# Patient Record
Sex: Male | Born: 1950 | Race: White | Hispanic: No | Marital: Single | Smoking: Former smoker
Health system: Southern US, Community
[De-identification: ages and names within clinical notes are randomized; demographics above are authoritative.]

## PROBLEM LIST (undated history)

## (undated) DIAGNOSIS — J449 Chronic obstructive pulmonary disease, unspecified: Secondary | ICD-10-CM

## (undated) DIAGNOSIS — N400 Enlarged prostate without lower urinary tract symptoms: Secondary | ICD-10-CM

## (undated) DIAGNOSIS — K219 Gastro-esophageal reflux disease without esophagitis: Secondary | ICD-10-CM

## (undated) DIAGNOSIS — E785 Hyperlipidemia, unspecified: Secondary | ICD-10-CM

## (undated) HISTORY — PX: ANKLE SURGERY: SHX546

---

## 2004-04-13 ENCOUNTER — Emergency Department: Payer: Self-pay | Admitting: Emergency Medicine

## 2004-04-15 ENCOUNTER — Other Ambulatory Visit: Payer: Self-pay

## 2004-04-15 ENCOUNTER — Inpatient Hospital Stay: Payer: Self-pay

## 2006-05-18 ENCOUNTER — Other Ambulatory Visit: Payer: Self-pay

## 2006-05-18 ENCOUNTER — Emergency Department: Payer: Self-pay | Admitting: Emergency Medicine

## 2010-08-31 ENCOUNTER — Emergency Department: Payer: Self-pay | Admitting: Emergency Medicine

## 2010-12-23 ENCOUNTER — Emergency Department: Payer: Self-pay | Admitting: Unknown Physician Specialty

## 2013-05-19 DIAGNOSIS — Z720 Tobacco use: Secondary | ICD-10-CM | POA: Insufficient documentation

## 2013-05-19 DIAGNOSIS — R12 Heartburn: Secondary | ICD-10-CM | POA: Insufficient documentation

## 2013-06-24 DIAGNOSIS — E785 Hyperlipidemia, unspecified: Secondary | ICD-10-CM | POA: Insufficient documentation

## 2017-11-16 ENCOUNTER — Inpatient Hospital Stay
Admission: EM | Admit: 2017-11-16 | Discharge: 2017-11-17 | DRG: 872 | Disposition: A | Discharge status: Home / Self Care | Payer: Medicare Other | Attending: Specialist | Admitting: Specialist

## 2017-11-16 ENCOUNTER — Emergency Department: Payer: Medicare Other

## 2017-11-16 ENCOUNTER — Other Ambulatory Visit: Payer: Self-pay

## 2017-11-16 DIAGNOSIS — I700 Atherosclerosis of aorta: Secondary | ICD-10-CM | POA: Diagnosis not present

## 2017-11-16 DIAGNOSIS — R10811 Right upper quadrant abdominal tenderness: Secondary | ICD-10-CM | POA: Diagnosis not present

## 2017-11-16 DIAGNOSIS — E785 Hyperlipidemia, unspecified: Secondary | ICD-10-CM | POA: Diagnosis present

## 2017-11-16 DIAGNOSIS — K219 Gastro-esophageal reflux disease without esophagitis: Secondary | ICD-10-CM | POA: Diagnosis not present

## 2017-11-16 DIAGNOSIS — Z7982 Long term (current) use of aspirin: Secondary | ICD-10-CM | POA: Diagnosis not present

## 2017-11-16 DIAGNOSIS — J439 Emphysema, unspecified: Secondary | ICD-10-CM | POA: Diagnosis not present

## 2017-11-16 DIAGNOSIS — A419 Sepsis, unspecified organism: Secondary | ICD-10-CM | POA: Diagnosis not present

## 2017-11-16 DIAGNOSIS — J441 Chronic obstructive pulmonary disease with (acute) exacerbation: Secondary | ICD-10-CM

## 2017-11-16 DIAGNOSIS — Z23 Encounter for immunization: Secondary | ICD-10-CM

## 2017-11-16 DIAGNOSIS — E871 Hypo-osmolality and hyponatremia: Secondary | ICD-10-CM | POA: Diagnosis not present

## 2017-11-16 DIAGNOSIS — G629 Polyneuropathy, unspecified: Secondary | ICD-10-CM | POA: Diagnosis not present

## 2017-11-16 DIAGNOSIS — F17210 Nicotine dependence, cigarettes, uncomplicated: Secondary | ICD-10-CM | POA: Diagnosis not present

## 2017-11-16 DIAGNOSIS — R6520 Severe sepsis without septic shock: Secondary | ICD-10-CM | POA: Diagnosis present

## 2017-11-16 DIAGNOSIS — J209 Acute bronchitis, unspecified: Secondary | ICD-10-CM | POA: Diagnosis not present

## 2017-11-16 DIAGNOSIS — E876 Hypokalemia: Secondary | ICD-10-CM | POA: Diagnosis not present

## 2017-11-16 HISTORY — DX: Chronic obstructive pulmonary disease, unspecified: J44.9

## 2017-11-16 LAB — CBC WITH DIFFERENTIAL/PLATELET
Basophils Absolute: 0 10*3/uL (ref 0–0.1)
Basophils Relative: 0 %
EOS PCT: 0 %
Eosinophils Absolute: 0 10*3/uL (ref 0–0.7)
HCT: 44.8 % (ref 40.0–52.0)
Hemoglobin: 15.6 g/dL (ref 13.0–18.0)
LYMPHS ABS: 0.6 10*3/uL — AB (ref 1.0–3.6)
Lymphocytes Relative: 7 %
MCH: 33.5 pg (ref 26.0–34.0)
MCHC: 34.8 g/dL (ref 32.0–36.0)
MCV: 96.1 fL (ref 80.0–100.0)
MONO ABS: 0.8 10*3/uL (ref 0.2–1.0)
MONOS PCT: 10 %
Neutro Abs: 7.1 10*3/uL — ABNORMAL HIGH (ref 1.4–6.5)
Neutrophils Relative %: 83 %
PLATELETS: 180 10*3/uL (ref 150–440)
RBC: 4.66 MIL/uL (ref 4.40–5.90)
RDW: 12.9 % (ref 11.5–14.5)
WBC: 8.6 10*3/uL (ref 3.8–10.6)

## 2017-11-16 LAB — COMPREHENSIVE METABOLIC PANEL
ALBUMIN: 3.3 g/dL — AB (ref 3.5–5.0)
ALK PHOS: 40 U/L (ref 38–126)
ALT: 38 U/L (ref 17–63)
AST: 54 U/L — ABNORMAL HIGH (ref 15–41)
Anion gap: 12 (ref 5–15)
BILIRUBIN TOTAL: 0.5 mg/dL (ref 0.3–1.2)
BUN: 10 mg/dL (ref 6–20)
CALCIUM: 7.5 mg/dL — AB (ref 8.9–10.3)
CO2: 20 mmol/L — ABNORMAL LOW (ref 22–32)
CREATININE: 1.04 mg/dL (ref 0.61–1.24)
Chloride: 94 mmol/L — ABNORMAL LOW (ref 101–111)
GFR calc Af Amer: >60 mL/min (ref >60)
GFR calc non Af Amer: >60 mL/min (ref >60)
GLUCOSE: 243 mg/dL — AB (ref 65–99)
Potassium: 2.9 mmol/L — ABNORMAL LOW (ref 3.5–5.1)
Sodium: 126 mmol/L — ABNORMAL LOW (ref 135–145)
TOTAL PROTEIN: 6.6 g/dL (ref 6.5–8.1)

## 2017-11-16 LAB — URINALYSIS, COMPLETE (UACMP) WITH MICROSCOPIC
BACTERIA UA: NONE SEEN
Bilirubin Urine: NEGATIVE
Glucose, UA: >=500 mg/dL — AB
Hgb urine dipstick: NEGATIVE
Ketones, ur: NEGATIVE mg/dL
Leukocytes, UA: NEGATIVE
Nitrite: NEGATIVE
PH: 6 (ref 5.0–8.0)
Protein, ur: NEGATIVE mg/dL
SPECIFIC GRAVITY, URINE: 1.044 — AB (ref 1.005–1.030)
SQUAMOUS EPITHELIAL / LPF: NONE SEEN (ref 0–5)

## 2017-11-16 LAB — POTASSIUM: POTASSIUM: 3.9 mmol/L (ref 3.5–5.1)

## 2017-11-16 LAB — LACTIC ACID, PLASMA
Lactic Acid, Venous: 2.5 mmol/L (ref 0.5–1.9)
Lactic Acid, Venous: 1.1 mmol/L (ref 0.5–1.9)

## 2017-11-16 LAB — PHOSPHORUS: Phosphorus: 2.5 mg/dL (ref 2.5–4.6)

## 2017-11-16 LAB — INFLUENZA PANEL BY PCR (TYPE A & B)
INFLAPCR: NEGATIVE
INFLBPCR: NEGATIVE

## 2017-11-16 LAB — PROTIME-INR
INR: 0.96
PROTHROMBIN TIME: 12.7 s (ref 11.4–15.2)

## 2017-11-16 LAB — MAGNESIUM: MAGNESIUM: 1.7 mg/dL (ref 1.7–2.4)

## 2017-11-16 MED ORDER — IPRATROPIUM-ALBUTEROL 0.5-2.5 (3) MG/3ML IN SOLN
3.0000 mL | Freq: Once | RESPIRATORY_TRACT | Status: AC
  Administered 2017-11-16: 3 mL via RESPIRATORY_TRACT
  Filled 2017-11-16: qty 3

## 2017-11-16 MED ORDER — SODIUM CHLORIDE 0.9 % IV SOLN
2.0000 g | INTRAVENOUS | Status: DC
  Administered 2017-11-16 – 2017-11-17 (×2): 2 g via INTRAVENOUS
  Filled 2017-11-16 (×2): qty 20
  Filled 2017-11-16: qty 2

## 2017-11-16 MED ORDER — LORAZEPAM 2 MG PO TABS: 0.0000 mg | ORAL_TABLET | Freq: Two times a day (BID) | ORAL | Status: DC

## 2017-11-16 MED ORDER — MAGNESIUM SULFATE 2 GM/50ML IV SOLN
2.0000 g | Freq: Once | INTRAVENOUS | Status: AC
Start: 2017-11-16 — End: 2017-11-16
  Administered 2017-11-16: 13:00:00 2 g via INTRAVENOUS
  Filled 2017-11-16: qty 50

## 2017-11-16 MED ORDER — IPRATROPIUM-ALBUTEROL 0.5-2.5 (3) MG/3ML IN SOLN
3.0000 mL | Freq: Four times a day (QID) | RESPIRATORY_TRACT | Status: DC
  Administered 2017-11-16 – 2017-11-17 (×5): 3 mL via RESPIRATORY_TRACT
  Filled 2017-11-16 (×5): qty 3

## 2017-11-16 MED ORDER — METHYLPREDNISOLONE SODIUM SUCC 125 MG IJ SOLR
60.0000 mg | Freq: Three times a day (TID) | INTRAMUSCULAR | Status: DC
  Administered 2017-11-16 – 2017-11-17 (×3): 60 mg via INTRAVENOUS
  Filled 2017-11-16 (×3): qty 2

## 2017-11-16 MED ORDER — DOCUSATE SODIUM 100 MG PO CAPS
100.0000 mg | ORAL_CAPSULE | Freq: Two times a day (BID) | ORAL | Status: DC
  Administered 2017-11-17: 10:00:00 100 mg via ORAL
  Filled 2017-11-16 (×2): qty 1

## 2017-11-16 MED ORDER — NICOTINE 21 MG/24HR TD PT24
21.0000 mg | MEDICATED_PATCH | Freq: Every day | TRANSDERMAL | Status: DC
  Administered 2017-11-16 – 2017-11-17 (×2): 21 mg via TRANSDERMAL
  Filled 2017-11-16 (×2): qty 1

## 2017-11-16 MED ORDER — ALBUTEROL SULFATE (2.5 MG/3ML) 0.083% IN NEBU: 2.5000 mg | INHALATION_SOLUTION | RESPIRATORY_TRACT | Status: DC | PRN

## 2017-11-16 MED ORDER — IOHEXOL 300 MG/ML  SOLN
100.0000 mL | Freq: Once | INTRAMUSCULAR | Status: AC | PRN
  Administered 2017-11-16: 100 mL via INTRAVENOUS

## 2017-11-16 MED ORDER — ACETAMINOPHEN 650 MG RE SUPP: 650.0000 mg | Freq: Four times a day (QID) | RECTAL | Status: DC | PRN

## 2017-11-16 MED ORDER — ONDANSETRON HCL 4 MG PO TABS: 4.0000 mg | ORAL_TABLET | Freq: Four times a day (QID) | ORAL | Status: DC | PRN

## 2017-11-16 MED ORDER — VITAMIN B-1 100 MG PO TABS
100.0000 mg | ORAL_TABLET | Freq: Every day | ORAL | Status: DC
  Administered 2017-11-16 – 2017-11-17 (×2): 100 mg via ORAL
  Filled 2017-11-16 (×2): qty 1

## 2017-11-16 MED ORDER — LORAZEPAM 2 MG/ML IJ SOLN
1.0000 mg | Freq: Four times a day (QID) | INTRAMUSCULAR | Status: DC | PRN
Start: 2017-11-16 — End: 2017-11-17

## 2017-11-16 MED ORDER — ONDANSETRON HCL 4 MG/2ML IJ SOLN: 4.0000 mg | Freq: Four times a day (QID) | INTRAMUSCULAR | Status: DC | PRN

## 2017-11-16 MED ORDER — LORAZEPAM 2 MG PO TABS: 0.0000 mg | ORAL_TABLET | Freq: Four times a day (QID) | ORAL | Status: DC

## 2017-11-16 MED ORDER — SODIUM CHLORIDE 0.9 % IV BOLUS
1000.0000 mL | Freq: Once | INTRAVENOUS | Status: AC
  Administered 2017-11-16: 1000 mL via INTRAVENOUS

## 2017-11-16 MED ORDER — SODIUM CHLORIDE 0.9 % IV SOLN
500.0000 mg | INTRAVENOUS | Status: DC
  Administered 2017-11-16: 500 mg via INTRAVENOUS
  Filled 2017-11-16 (×2): qty 500

## 2017-11-16 MED ORDER — TRAMADOL HCL 50 MG PO TABS
50.0000 mg | ORAL_TABLET | Freq: Four times a day (QID) | ORAL | Status: DC | PRN
  Filled 2017-11-16: qty 1

## 2017-11-16 MED ORDER — ADULT MULTIVITAMIN W/MINERALS CH
1.0000 | ORAL_TABLET | Freq: Every day | ORAL | Status: DC
  Administered 2017-11-16 – 2017-11-17 (×2): 1 via ORAL
  Filled 2017-11-16 (×2): qty 1

## 2017-11-16 MED ORDER — CALCIUM CARBONATE ANTACID 500 MG PO CHEW
500.0000 mg | CHEWABLE_TABLET | Freq: Two times a day (BID) | ORAL | Status: DC
  Administered 2017-11-16 – 2017-11-17 (×2): 500 mg via ORAL
  Filled 2017-11-16 (×2): qty 3

## 2017-11-16 MED ORDER — SODIUM CHLORIDE 0.9% FLUSH: 3.0000 mL | INTRAVENOUS | Status: DC | PRN

## 2017-11-16 MED ORDER — ASPIRIN EC 81 MG PO TBEC
81.0000 mg | DELAYED_RELEASE_TABLET | Freq: Every day | ORAL | Status: DC
  Administered 2017-11-16 – 2017-11-17 (×2): 81 mg via ORAL
  Filled 2017-11-16 (×2): qty 1

## 2017-11-16 MED ORDER — ENOXAPARIN SODIUM 40 MG/0.4ML ~~LOC~~ SOLN
40.0000 mg | SUBCUTANEOUS | Status: DC
  Administered 2017-11-16 – 2017-11-17 (×2): 40 mg via SUBCUTANEOUS
  Filled 2017-11-16 (×2): qty 0.4

## 2017-11-16 MED ORDER — THIAMINE HCL 100 MG/ML IJ SOLN
100.0000 mg | Freq: Every day | INTRAMUSCULAR | Status: DC
  Filled 2017-11-16 (×2): qty 1

## 2017-11-16 MED ORDER — POTASSIUM CHLORIDE 10 MEQ/100ML IV SOLN
10.0000 meq | Freq: Once | INTRAVENOUS | Status: AC
  Administered 2017-11-16: 10 meq via INTRAVENOUS
  Filled 2017-11-16: qty 100

## 2017-11-16 MED ORDER — LORAZEPAM 1 MG PO TABS: 1.0000 mg | ORAL_TABLET | Freq: Four times a day (QID) | ORAL | Status: DC | PRN

## 2017-11-16 MED ORDER — SODIUM CHLORIDE 0.9% FLUSH
3.0000 mL | Freq: Two times a day (BID) | INTRAVENOUS | Status: DC
  Administered 2017-11-16 – 2017-11-17 (×3): 3 mL via INTRAVENOUS

## 2017-11-16 MED ORDER — POTASSIUM CHLORIDE CRYS ER 20 MEQ PO TBCR
40.0000 meq | EXTENDED_RELEASE_TABLET | Freq: Once | ORAL | Status: AC
  Administered 2017-11-16: 14:00:00 40 meq via ORAL
  Filled 2017-11-16: qty 2

## 2017-11-16 MED ORDER — FOLIC ACID 1 MG PO TABS
1.0000 mg | ORAL_TABLET | Freq: Every day | ORAL | Status: DC
  Administered 2017-11-16 – 2017-11-17 (×2): 1 mg via ORAL
  Filled 2017-11-16 (×2): qty 1

## 2017-11-16 MED ORDER — PNEUMOCOCCAL VAC POLYVALENT 25 MCG/0.5ML IJ INJ
0.5000 mL | INJECTION | INTRAMUSCULAR | Status: AC
  Administered 2017-11-17: 10:00:00 0.5 mL via INTRAMUSCULAR
  Filled 2017-11-16: qty 0.5

## 2017-11-16 MED ORDER — IBUPROFEN 600 MG PO TABS
600.0000 mg | ORAL_TABLET | ORAL | Status: AC
  Administered 2017-11-16: 600 mg via ORAL
  Filled 2017-11-16: qty 1

## 2017-11-16 MED ORDER — METHYLPREDNISOLONE SODIUM SUCC 125 MG IJ SOLR
125.0000 mg | INTRAMUSCULAR | Status: AC
  Administered 2017-11-16: 125 mg via INTRAVENOUS
  Filled 2017-11-16: qty 2

## 2017-11-16 MED ORDER — SODIUM CHLORIDE 0.9 % IV BOLUS: 1000.0000 mL | Freq: Once | INTRAVENOUS | Status: DC

## 2017-11-16 MED ORDER — ACETAMINOPHEN 325 MG PO TABS: 650.0000 mg | ORAL_TABLET | Freq: Four times a day (QID) | ORAL | Status: DC | PRN

## 2017-11-16 MED ORDER — ACETAMINOPHEN 500 MG PO TABS
1000.0000 mg | ORAL_TABLET | ORAL | Status: AC
  Administered 2017-11-16: 1000 mg via ORAL
  Filled 2017-11-16: qty 2

## 2017-11-16 NOTE — Progress Notes (Signed)
Chaplain received an OR to complete an AD for the patient. Chaplain introduced herself and asked if the patient requested some information on the AD. Patient agreed and visiting friend also asked if I could tell them more. During education, patient made a facial gesture and he explained that they had just gone through this process for his mother, who had recently passed away.  Patient began to share information about some family relationship concerns as well as the difficulty he has been having moving forward in his grief and anger.  Patient and his sister are estranged and he has some negative emotions around the care and treatment of their mother. Guest asked if Chaplain could pray, which she obliged. Chaplain prayed and patient expressed gratitude. Chaplain completed AD education and left application with patient for review.       11/16/17 1500  Clinical Encounter Type  Visited With Patient and family together  Visit Type Initial  Referral From Physician  Spiritual Encounters  Spiritual Needs Prayer;Emotional  Stress Factors  Patient Stress Factors Family relationships

## 2017-11-16 NOTE — Progress Notes (Signed)
Pt admitted under ED with Pneumonia/sepsis. Lactic acid decreasing. Reports drinks 6 pack per day and hasn't drank for couple of days; smokes 2 ppd and has not smoked for 2 days. Reports has episodes where he blacks out from coughing and finds himself on floor; denies history of heart problems, irregular rhythm or chest pain other than from "my smoking". CIWA negative at this time. Reports abdomen sore form coughing- refused pain med offers x 2. Hypotensive upon initial standing. Pulse ox stable on 2l/[place].

## 2017-11-16 NOTE — ED Triage Notes (Signed)
Alert, oriented, ambulatory around treatment room. States he was coming up here last night and didn't want to so came this AM. States SOB, "eyes are tired." states he's cold and hot. Someone tried give him mucinex but he didn't want that because it made his chest. States cough. States fever but didn't take it. Talking in complete sentences. Symptoms since Thursday.

## 2017-11-16 NOTE — Care Management Note (Signed)
Case Management Note  Patient Details  Name: Rodney Howe MRN: [id number] Date of Birth: [birth year]  Subjective/Objective:     RNCM received from admitting MD. Consult completed on patient. Patient lives with his girlfriend Rodney Howe [phone]. Patient currently uninsured but follows a the [facility] for all PCP and medication needs, currently obtains medications without issue. Able to complete all activities of daily living independently. No use of DME. Still drives.               Action/Plan:  RNCM to continue to follow for any needs.  Expected Discharge Date:  11/18/17               Expected Discharge Plan:     In-House Referral:     Discharge planning Services     Post Acute Care Choice:    Choice offered to:     DME Arranged:    DME Agency:     HH Arranged:    HH Agency:     Status of Service:     If discussed at [employer], dates discussed:    Additional Comments:  Latanya Maudlin, RN 11/16/2017, 2:36 PM

## 2017-11-16 NOTE — Progress Notes (Addendum)
MEDICATION RELATED CONSULT NOTE - INITIAL   Pharmacy Consult for electrolyte management Indication: hypokalemia  No Known Allergies  Patient Measurements: Height: 5\' 6"  (167.6 cm) Weight: 143 lb 6.4 oz (65 kg) IBW/kg (Calculated) : 63.8 Adjusted Body Weight:   Vital Signs: Temp: 99.3 F (37.4 C) (06/16 1128) Temp Source: Oral (06/16 1128) BP: 91/65 (06/16 1214) Pulse Rate: 88 (06/16 1214) Intake/Output from previous day: No intake/output data recorded. Intake/Output from this shift: No intake/output data recorded.  Labs: Recent Labs    11/16/17 0857  WBC 8.6  HGB 15.6  HCT 44.8  PLT 180  CREATININE 1.04  ALBUMIN 3.3*  PROT 6.6  AST 54*  ALT 38  ALKPHOS 40  BILITOT 0.5   Estimated Creatinine Clearance: 63.9 mL/min (by C-G formula based on SCr of 1.04 mg/dL).   Microbiology: No results found for this or any previous visit (from the past 720 hour(s)).  Medical History: Past Medical History:  Diagnosis Date  . COPD (chronic obstructive pulmonary disease) (HCC)     Medications:  Infusions:  . azithromycin Stopped (11/16/17 1017)  . cefTRIAXone (ROCEPHIN)  IV Stopped (11/16/17 1017)  . magnesium sulfate 1 - 4 g bolus IVPB 2 g (11/16/17 1246)    Assessment: 67 yom cc fever/lethargy with PMH COPD, current smoker and EtOH about 12 beers per day. CXR negative. Low K noted, pharmacy consulted to manage electrolytes.  Goal of Therapy:  K 3.5 to 5 Ca 8.9 to 10.3 Mg 1.7 to 2.4 Phos 2.5 to 4.6  Plan:  EDP gave KCl 10 mEq IV x 1, and admitting ordered Klor-Con 40 mEq po x 1. Give those doses. Give calcium carbonate 500 mg of elemental calcium po BID WF x 2 doses. Add-on Mg/Phos. Will recheck K at 20:00. Recheck all electrolytes tomorrow with AM labs.  11/16/2017 14:30 add-on Mg 1.7, phos 2.5. No supplement indicated at this point. Recheck electrolytes as above.   Laural Benes, Pharm.D., BCPS Clinical Pharmacist 11/16/2017,1:29 PM

## 2017-11-16 NOTE — H&P (Signed)
Rodney Howe at [facility] NAME: Rodney Howe    MR#:  [id number]  DATE OF BIRTH:  [birth year]  DATE OF ADMISSION:  11/16/2017  PRIMARY CARE PHYSICIAN: [facility] [place]   REQUESTING/REFERRING PHYSICIAN: [facility]  CHIEF COMPLAINT:  Fever and tiredness  HISTORY OF PRESENT ILLNESS:  Rodney Howe  is a 67 y.o. male with a known history of COPD, continues to smoke 2 packs a day, drinks 12 beer per day is presenting to the ED with a chief complaint of fever and tiredness.  Patient reports he has been coughing a lot which has been progressively getting worse and feels short of breath with minimal exertion.  Patient is reporting abdominal pain with cough CT abdomen was done in the ED with no acute findings.  Chest x-ray negative.  Patient was given Solu-Medrol and IV antibiotics were started hospitalist team is called to admit the patient.  PAST MEDICAL HISTORY:   Past Medical History:  Diagnosis Date  . COPD (chronic obstructive pulmonary disease) ([place])     PAST SURGICAL HISTOIRY:   Past Surgical History:  Procedure Laterality Date  . ANKLE SURGERY      SOCIAL HISTORY:   Reports smoking 2 packs a day for the past 49 years Patient drinks 12 cans of beer every day  FAMILY HISTORY:  History reviewed. No pertinent family history. Denies any hypertension or diabetes mellitus or heart condition DRUG ALLERGIES:  No Known Allergies  REVIEW OF SYSTEMS:  CONSTITUTIONAL: Reporting fever and weakness  eYES: No blurred or double vision.  EARS, NOSE, AND THROAT: No tinnitus or ear pain.  RESPIRATORY:  reporting cough, shortness of breath with exertion  denieswheezing or hemoptysis.  CARDIOVASCULAR: No chest pain, orthopnea, edema.  GASTROINTESTINAL: No nausea, vomiting, diarrhea or abdominal pain.  GENITOURINARY: No dysuria, hematuria.  ENDOCRINE: No polyuria, nocturia,  HEMATOLOGY: No anemia, easy bruising or bleeding SKIN:  No rash or lesion. MUSCULOSKELETAL: No joint pain or arthritis.   NEUROLOGIC: No tingling, numbness, weakness.  PSYCHIATRY: No anxiety or depression.   MEDICATIONS AT HOME:   Prior to Admission medications   Medication Sig Start Date End Date Taking? Authorizing Provider  aspirin EC 81 MG tablet Take 81 mg by mouth daily.   Yes [provider]      VITAL SIGNS:  Blood pressure 91/65, pulse 88, temperature 99.3 F (37.4 C), temperature source Oral, resp. rate 20, height _0  (1.676 m), weight 65 kg (143 lb 6.4 oz), SpO2 95 %.  PHYSICAL EXAMINATION:  GENERAL:  67 y.o.-year-old patient lying in the bed with no acute distress.  EYES: Pupils equal, round, reactive to light and accommodation. No scleral icterus. Extraocular muscles intact.  HEENT: Head atraumatic, normocephalic. Oropharynx and nasopharynx clear.  NECK:  Supple, no jugular venous distention. No thyroid enlargement, no tenderness.  LUNGS:  moderate breath sounds bilaterally, diffuse wheezing, no rales,rhonchi or crepitation. No use of accessory muscles of respiration.  CARDIOVASCULAR: S1, S2 normal. No murmurs, rubs, or gallops.  ABDOMEN: Soft, nontender, nondistended. Bowel sounds present. No organomegaly or mass.  EXTREMITIES: No pedal edema, cyanosis, or clubbing.  NEUROLOGIC: Cranial nerves II through XII are intact. Muscle strength 5/5 in all extremities. Sensation intact. Gait not checked.  PSYCHIATRIC: The patient is alert and oriented x 3.  SKIN: No obvious rash, lesion, or ulcer.   LABORATORY PANEL:   CBC Recent Labs  Lab 11/16/17 0857  WBC 8.6  HGB 15.6  HCT 44.8  PLT 180   ------------------------------------------------------------------------------------------------------------------  Chemistries  Recent Labs  Lab 11/16/17 0857  NA 126*  K 2.9*  CL 94*  CO2 20*  GLUCOSE 243*  BUN 10  CREATININE 1.04  CALCIUM 7.5*  AST 54*  ALT 38  ALKPHOS 40  BILITOT 0.5    ------------------------------------------------------------------------------------------------------------------  Cardiac Enzymes No results for input(s): TROPONINI in the last 168 hours. ------------------------------------------------------------------------------------------------------------------  RADIOLOGY:  Ct Abdomen Pelvis W Contrast  Result Date: 11/16/2017 CLINICAL DATA:  Fever. Tachycardia. Right-sided abdominal soreness. Question peritonitis. EXAM: CT ABDOMEN AND PELVIS WITH CONTRAST TECHNIQUE: Multidetector CT imaging of the abdomen and pelvis was performed using the standard protocol following bolus administration of intravenous contrast. CONTRAST:  132m OMNIPAQUE IOHEXOL 300 MG/ML  SOLN COMPARISON:  12/23/2010 FINDINGS: Lower chest: Normal heart size without pericardial or pleural effusion. Emphysema. Hepatobiliary: Low-density liver lesions are similar and likely cysts. Normal gallbladder, without biliary ductal dilatation. Pancreas: Normal, without mass or ductal dilatation. Spleen: Normal in size, without focal abnormality. Adrenals/Urinary Tract: Normal adrenal glands. Normal kidneys, without hydronephrosis. Normal urinary bladder. Stomach/Bowel: Proximal gastric underdistention. Scattered colonic diverticula. Normal terminal ileum and appendix. Normal small bowel. Vascular/Lymphatic: Aortic and branch vessel atherosclerosis. Atherosclerotic calcifications at the origin of both renal arteries. No abdominopelvic adenopathy. Reproductive: Normal prostate. Other: No significant free fluid. Musculoskeletal: Degenerative disc disease at the lumbosacral junction. IMPRESSION: 1.  No acute process in the abdomen or pelvis. 2. Aortic atherosclerosis (ICD10-I70.0) and emphysema (ICD10-J43.9). Electronically Signed   By: KAbigail MiyamotoM.D.   On: 11/16/2017 10:26   Dg Chest Portable 1 View  Result Date: 11/16/2017 CLINICAL DATA:  Fatigue, fever, shortness of breath EXAM: PORTABLE CHEST 1  VIEW COMPARISON:  12/23/2010 FINDINGS: Heart and mediastinal contours are within normal limits. No focal opacities or effusions. No acute bony abnormality. IMPRESSION: No active disease. Electronically Signed   By: KRolm BaptiseM.D.   On: 11/16/2017 09:11    EKG:   Orders placed or performed during the hospital encounter of 11/16/17  . EKG 12-Lead  . EKG 12-Lead    IMPRESSION AND PLAN:   Rodney Howe is a 67y.o. male with a known history of COPD, continues to smoke 2 packs a day, drinks 12 beer per day is presenting to the ED with a chief complaint of fever and tiredness.  Patient reports he has been coughing a lot which has been progressively getting worse and feels short of breath with minimal exertion.  Patient is reporting abdominal pain with cough CT abdomen was done in the ED with no acute findings.  Chest x-ray negative.   #Sepsis probably from acute bronchitis- Admit to MedSurg unit Patient met septic criteria with fever, tachycardia, elevated lactic acid level at the time of admission  blood cultures, urine cultures and sputum culture and sensitivity ordered Chest x-ray negative flu test is negative Started on antibiotic Rocephin and azithromycin  #Acute COPD Solu-Medrol 125 mg IV given in the ED continue salmeterol 60 mg IV every 8 hours and bronchodilator treatments Sputum culture and sensitivity Antibiotics Rocephin and azithromycin Stop smoking  #Hyponatremia-secondary to beer potomania Gentle hydration with IV fluids Check a.m. Labs Patient is mentating fine   #Hypokalemia potassium at 2.9 replete and recheck in a.m. check magnesium also  #alcohol abuse ciwa Patient will be benefited with outpatient [place]pauldingCounseled patient to stop drinking alcohol  #Tobacco abuse disorder Patient smokes 2 packs a day  Counseled patient to quit completely for 5 min.  Patient is willing to  quit smoking.  Will provide nicotine patch       All the records are reviewed and  case discussed with ED provider. Management plans discussed with the patient, family and they are in agreement.  CODE STATUS: FC , girl friend  Production designer, theatre/television/film  TOTAL TIME TAKING CARE OF THIS PATIENT: 42  minutes.   Note: This dictation was prepared with Dragon dictation along with smaller phrase technology. Any transcriptional errors that result from this process are unintentional.  Nicholes Mango M.D on 11/16/2017 at 1:02 PM  Between 7am to 6pm - Pager - [phone]  After 6pm go to [email or web address] - password [email or web address] [facility]  [facility]  Office  [phone]  CC: Primary care physician; [facility]

## 2017-11-16 NOTE — ED Provider Notes (Signed)
[facility] Emergency Department Provider Note   ____________________________________________   First MD Initiated Contact with Patient 11/16/17 [phone]     (approximate)  I have reviewed the triage vital signs and the nursing notes.   HISTORY  Chief Complaint Fatigue    HPI Rodney Howe is a 67 y.o. male reports no major medical history, thinks he might have COPD  Patient reports that for about 3 days now has been feeling fatigued.  He has not been eating well, he reports he was out in the rain working on cars and he is been having a bit of a cough and chest congestion.  Been coughing hard at times.  Reports he has been feeling chills fatigue and very short of breath with walking.  No chest pain, but does report a slight achiness behind the ribs on his right lower chest wall with coughing.  No abdominal pain, reports nausea and decreased appetite.  Has a history of COPD he thinks, but not using any treatment for it.  Has noticed some slight wheezing as well  No recent hospitalizations.  No recent use of antibiotics.  Past Medical History:  Diagnosis Date  . COPD (chronic obstructive pulmonary disease) (HCC)     There are no active problems to display for this patient.   Past Surgical History:  Procedure Laterality Date  . ANKLE SURGERY      Prior to Admission medications   Medication Sig Start Date End Date Taking? Authorizing Provider  aspirin EC 81 MG tablet Take 81 mg by mouth daily.   Yes [provider]    Allergies Patient has no known allergies.  History reviewed. No pertinent family history.  Social History Social History   Tobacco Use  . Smoking status: Current Every Day Smoker  Substance Use Topics  . Alcohol use: Never    Frequency: Never  . Drug use: Not on file    Review of Systems Constitutional: Fevers and chills  eyes: No visual changes.  Feels like his eyes are sunken ENT: No sore  throat. Cardiovascular: Denies chest pain. Respiratory: See HPI.  Not short of breath at rest, feels very short of breath when he tries to walk the last day Gastrointestinal: No abdominal pain.  Vomited once yesterday.  No diarrhea.  No constipation. Genitourinary: Negative for dysuria. Musculoskeletal: Negative for back pain. Skin: Negative for rash. Neurological: Negative for headaches, focal weakness or numbness.    ____________________________________________   PHYSICAL EXAM:  VITAL SIGNS: ED Triage Vitals  Enc Vitals Group     BP 11/16/17 0839 102/76     Pulse Rate 11/16/17 0839 (!) 118     Resp 11/16/17 0839 20     Temp 11/16/17 0839 (!) 102.6 F (39.2 C)     Temp Source 11/16/17 0839 Oral     SpO2 11/16/17 0839 93 %     Weight 11/16/17 0840 145 lb (65.8 kg)     Height 11/16/17 0840 5\' 6"  (1.676 m)     Head Circumference --      Peak Flow --      Pain Score 11/16/17 0840 0     Pain Loc --      Pain Edu? --      Excl. in [place]? --     Constitutional: Alert and oriented.  Moderately ill-appearing, appears slightly pale, slightly fatigued, generally appearing sick but in no distress.  Is very pleasant. Eyes: Conjunctivae are normal. Head: Atraumatic. Nose: No congestion/rhinnorhea.  Mouth/Throat: Mucous membranes are dry. Neck: No stridor.   Cardiovascular: Tachycardic rate, regular rhythm. Grossly normal heart sounds.  Good peripheral circulation. Respiratory: Normal respiratory effort.  No retractions.  There is mild and expiratory wheezing to noted bilaterally more in the lower lobes than the upper, slight increased expiratory phase, the left lung sounds clear, but there are faint crackles appearing in the area of the right base. Gastrointestinal: Soft and nontender for some mild tenderness reported over the right upper abdomen and right flank which the patient reports feels like a "sore muscles". No distention.  Musculoskeletal: No lower extremity tenderness nor  edema. Neurologic:  Normal speech and language. No gross focal neurologic deficits are appreciated.  Skin:  Skin is warm, dry and intact. No rash noted. Psychiatric: Mood and affect are normal. Speech and behavior are normal.  ____________________________________________   LABS (all labs ordered are listed, but only abnormal results are displayed)  Labs Reviewed  COMPREHENSIVE METABOLIC PANEL - Abnormal; Notable for the following components:      Result Value   Sodium 126 (*)    Potassium 2.9 (*)    Chloride 94 (*)    CO2 20 (*)    Glucose, Bld 243 (*)    Calcium 7.5 (*)    Albumin 3.3 (*)    AST 54 (*)    All other components within normal limits  LACTIC ACID, PLASMA - Abnormal; Notable for the following components:   Lactic Acid, Venous 2.5 (*)    All other components within normal limits  CBC WITH DIFFERENTIAL/PLATELET - Abnormal; Notable for the following components:   Neutro Abs 7.1 (*)    Lymphs Abs 0.6 (*)    All other components within normal limits  CULTURE, BLOOD (ROUTINE X 2)  CULTURE, BLOOD (ROUTINE X 2)  PROTIME-INR  INFLUENZA PANEL BY PCR (TYPE A & B)  LACTIC ACID, PLASMA  URINALYSIS, COMPLETE (UACMP) WITH MICROSCOPIC   ____________________________________________  EKG  Reviewed and interpreted by me at 8:50 AM Heart rate 110 QRS 120 QTc 430 Sinus tachycardia, no evidence of acute ischemia ____________________________________________  RADIOLOGY  Chest x-ray reviewed by me negative for acute   IMPRESSION: 1. No acute process in the abdomen or pelvis. 2. Aortic atherosclerosis (ICD10-I70.0) and emphysema (ICD10-J43.9). ____________________________________________   PROCEDURES  Procedure(s) performed: None  Procedures  Critical Care performed: Yes, see critical care note(s)  CRITICAL CARE Performed by: Delman Kitten   Total critical care time: 45 minutes  Critical care time was exclusive of separately billable procedures and treating  other patients.  Critical care was necessary to treat or prevent imminent or life-threatening deterioration.  Critical care was time spent personally by me on the following activities: development of treatment plan with patient and/or surrogate as well as nursing, discussions with consultants, evaluation of patient's response to treatment, examination of patient, obtaining history from patient or surrogate, ordering and performing treatments and interventions, ordering and review of laboratory studies, ordering and review of radiographic studies, pulse oximetry and re-evaluation of patient's condition.  ____________________________________________   INITIAL IMPRESSION / ASSESSMENT AND PLAN / ED COURSE  Pertinent labs & imaging results that were available during my care of the patient were reviewed by me and considered in my medical decision making (see chart for details).  Constellations of symptoms and clinical history concerning for an acute infectious etiology likely showing signs and symptoms of sepsis.  Source not yet realized, will initiate broad work-up including chest x-ray where I suspect likely source is  pulmonary given his symptoms, but also consider other sources such as viral, urinary, abdominal, etc.  Denies any skin rash or skin problems.  No open sores or wounds to noted.  We will also initiate treatment for probable mild concomitant COPD exacerbation.   ED Sepsis - Repeat Assessment   Performed at:    ----------------------------------------- 10:50 AM on 11/16/2017 -----------------------------------------    Last Vitals:    Blood pressure 94/68, pulse 96, temperature 100.2 F (37.9 C), temperature source Oral, resp. rate (!) 28, height 5\' 6"  (1.676 m), weight 65.8 kg (145 lb), SpO2 (!) 87 %.  Heart:      Normal tones, rate improved  Lungs:     Clear bilateral  Capillary Refill:   1 second, left radial  Peripheral Pulse (include location): Left radial   Skin  (include color):   Warm, well-perfused and pink  Oxygen saturation had dropped after CT scan, but is now returned to high 90s on 2 L nasal cannula.  Patient denies shortness of breath at this time and reports he is feeling better overall.   Clinical Course as of Nov 17 1050  Sun Nov 16, 2017  0939 Chest x-ray clear.  Patient appears to be resting more comfortably, heart rate has improved to 102.  Blood pressure mild hypotension now 90/67.  Reevaluate him he reports his breathing is improved, he feels better, but I am concerned about ongoing etiology of his symptoms.  On further discussion, he does report is having soreness over the right side of the abdomen with reports it feels like a muscle soreness for about the last 4 days, on exam he does report some mild tenderness in the right flank.  Given his chest x-ray does not demonstrate a large pneumonia, but clinical history and examination seem to suggest a possible right middle lobe pneumonia I have treated him with antibiotics as such, but will proceed with CT abdomen pelvis to further evaluate for etiology and exclude etiology such as diverticulitis appendicitis cholecystitis etc.   [MQ]    Clinical Course User Index [MQ] Delman Kitten, MD   ----------------------------------------- 10:51 AM on 11/16/2017 -----------------------------------------  Etiology potentially viral bronchitis with COPD exacerbation suspected at this time.  CT negative.  Awaiting urinalysis.  Culture sent, antibiotics empirically given.  Will admit to hospitalist service, discussed case with Dr. Margaretmary Eddy at this time  ____________________________________________   FINAL CLINICAL IMPRESSION(S) / ED DIAGNOSES  Final diagnoses:  Severe sepsis ([place])  COPD with acute exacerbation ([place])      NEW MEDICATIONS STARTED DURING THIS VISIT:  New Prescriptions   No medications on file     Note:  This document was prepared using Dragon voice recognition software and may  include unintentional dictation errors.     Delman Kitten, MD 11/16/17 1052

## 2017-11-16 NOTE — Progress Notes (Signed)
Family Meeting Note  Advance Directive:yes  Today a meeting took place with the Patient.    The following clinical team members were present during this meeting:MD  The following were discussed:Patient's diagnosis: Sepsis with fever, generalized weakness, COPD exacerbation, acute bronchitis, hypokalemia, other comorbidities tobacco abuse disorder continues to smoke 2 packs a day for the past 49 years, alcohol abuse, treatment plan of care discussed in detail with the patient.  He verbalized understanding of the plan.    Patient's progosis: Unable to determine and Goals for treatment: Full Code  Healthcare power of attorney girlfriend Training and development officer  Additional follow-up to be provided: hospitalist  Time spent during discussion:17 min  Nicholes Mango, MD

## 2017-11-16 NOTE — Progress Notes (Signed)
MEDICATION RELATED CONSULT NOTE - INITIAL   Pharmacy Consult for electrolyte management Indication: hypokalemia  No Known Allergies  Patient Measurements: Height: 5\' 6"  (167.6 cm) Weight: 143 lb 6.4 oz (65 kg) IBW/kg (Calculated) : 63.8 Adjusted Body Weight:   Vital Signs: Temp: 98.1 F (36.7 C) (06/16 2014) Temp Source: Oral (06/16 2014) BP: 102/76 (06/16 2014) Pulse Rate: 81 (06/16 2014) Intake/Output from previous day: No intake/output data recorded. Intake/Output from this shift: No intake/output data recorded.  Labs: Recent Labs    11/16/17 0857  WBC 8.6  HGB 15.6  HCT 44.8  PLT 180  CREATININE 1.04  MG 1.7  PHOS 2.5  ALBUMIN 3.3*  PROT 6.6  AST 54*  ALT 38  ALKPHOS 40  BILITOT 0.5   Estimated Creatinine Clearance: 63.9 mL/min (by C-G formula based on SCr of 1.04 mg/dL).   Microbiology: No results found for this or any previous visit (from the past 720 hour(s)).  Medical History: Past Medical History:  Diagnosis Date  . COPD (chronic obstructive pulmonary disease) (HCC)     Medications:  Infusions:  . azithromycin Stopped (11/16/17 1017)  . cefTRIAXone (ROCEPHIN)  IV Stopped (11/16/17 1017)    Assessment: 67 yom cc fever/lethargy with PMH COPD, current smoker and EtOH about 12 beers per day. CXR negative. Low K noted, pharmacy consulted to manage electrolytes.  Goal of Therapy:  K 3.5 to 5 Ca 8.9 to 10.3 Mg 1.7 to 2.4 Phos 2.5 to 4.6  Plan:  EDP gave KCl 10 mEq IV x 1, and admitting ordered Klor-Con 40 mEq po x 1. Give those doses. Give calcium carbonate 500 mg of elemental calcium po BID WF x 2 doses. Add-on Mg/Phos. Will recheck K at 20:00. Recheck all electrolytes tomorrow with AM labs.  11/16/2017 14:30 add-on Mg 1.7, phos 2.5. No supplement indicated at this point. Recheck electrolytes as above.   11/16/2017 20:04 K 3.9. Recheck electrolytes with AM labs.   Laural Benes, Pharm.D., BCPS Clinical Pharmacist 11/16/2017,8:45  PM

## 2017-11-16 NOTE — ED Notes (Signed)
Report given to Jane RN

## 2017-11-16 NOTE — ED Notes (Signed)
Patient SpO2 at 87%. Pt placed on 2 liters via [place] with improvement in SpO2 to 93-94%

## 2017-11-16 NOTE — Progress Notes (Signed)
CODE SEPSIS - PHARMACY COMMUNICATION  **Broad Spectrum Antibiotics should be administered within 1 hour of Sepsis diagnosis**  Time Code Sepsis Called/Page Received: 7017  Antibiotics Ordered: azithromycin and CTX  Time of 1st antibiotic administration: 0909  Additional action taken by pharmacy:   If necessary, Name of Provider/Nurse Contacted:     Napoleon Form ,PharmD Clinical Pharmacist  11/16/2017  8:57 AM

## 2017-11-17 DIAGNOSIS — Z7982 Long term (current) use of aspirin: Secondary | ICD-10-CM | POA: Diagnosis not present

## 2017-11-17 DIAGNOSIS — A419 Sepsis, unspecified organism: Secondary | ICD-10-CM | POA: Diagnosis not present

## 2017-11-17 DIAGNOSIS — F17210 Nicotine dependence, cigarettes, uncomplicated: Secondary | ICD-10-CM | POA: Diagnosis not present

## 2017-11-17 DIAGNOSIS — K219 Gastro-esophageal reflux disease without esophagitis: Secondary | ICD-10-CM | POA: Diagnosis not present

## 2017-11-17 DIAGNOSIS — E876 Hypokalemia: Secondary | ICD-10-CM | POA: Diagnosis not present

## 2017-11-17 DIAGNOSIS — G629 Polyneuropathy, unspecified: Secondary | ICD-10-CM | POA: Diagnosis not present

## 2017-11-17 DIAGNOSIS — J209 Acute bronchitis, unspecified: Secondary | ICD-10-CM | POA: Diagnosis not present

## 2017-11-17 DIAGNOSIS — R6520 Severe sepsis without septic shock: Secondary | ICD-10-CM | POA: Diagnosis not present

## 2017-11-17 DIAGNOSIS — R10811 Right upper quadrant abdominal tenderness: Secondary | ICD-10-CM | POA: Diagnosis not present

## 2017-11-17 DIAGNOSIS — E871 Hypo-osmolality and hyponatremia: Secondary | ICD-10-CM | POA: Diagnosis not present

## 2017-11-17 DIAGNOSIS — E785 Hyperlipidemia, unspecified: Secondary | ICD-10-CM | POA: Diagnosis not present

## 2017-11-17 DIAGNOSIS — J439 Emphysema, unspecified: Secondary | ICD-10-CM | POA: Diagnosis not present

## 2017-11-17 DIAGNOSIS — I700 Atherosclerosis of aorta: Secondary | ICD-10-CM | POA: Diagnosis not present

## 2017-11-17 DIAGNOSIS — Z23 Encounter for immunization: Secondary | ICD-10-CM | POA: Diagnosis present

## 2017-11-17 DIAGNOSIS — J441 Chronic obstructive pulmonary disease with (acute) exacerbation: Secondary | ICD-10-CM | POA: Diagnosis not present

## 2017-11-17 LAB — COMPREHENSIVE METABOLIC PANEL
ALT: 29 U/L (ref 17–63)
AST: 35 U/L (ref 15–41)
Albumin: 2.8 g/dL — ABNORMAL LOW (ref 3.5–5.0)
Alkaline Phosphatase: 37 U/L — ABNORMAL LOW (ref 38–126)
Anion gap: 5 (ref 5–15)
BUN: 9 mg/dL (ref 6–20)
CHLORIDE: 104 mmol/L (ref 101–111)
CO2: 22 mmol/L (ref 22–32)
CREATININE: 0.6 mg/dL — AB (ref 0.61–1.24)
Calcium: 7.7 mg/dL — ABNORMAL LOW (ref 8.9–10.3)
Glucose, Bld: 174 mg/dL — ABNORMAL HIGH (ref 65–99)
POTASSIUM: 3.8 mmol/L (ref 3.5–5.1)
Sodium: 131 mmol/L — ABNORMAL LOW (ref 135–145)
Total Bilirubin: 0.3 mg/dL (ref 0.3–1.2)
Total Protein: 5.7 g/dL — ABNORMAL LOW (ref 6.5–8.1)

## 2017-11-17 LAB — CBC
HCT: 40.7 % (ref 40.0–52.0)
Hemoglobin: 14.4 g/dL (ref 13.0–18.0)
MCH: 34 pg (ref 26.0–34.0)
MCHC: 35.4 g/dL (ref 32.0–36.0)
MCV: 95.9 fL (ref 80.0–100.0)
PLATELETS: 164 10*3/uL (ref 150–440)
RBC: 4.24 MIL/uL — ABNORMAL LOW (ref 4.40–5.90)
RDW: 12.6 % (ref 11.5–14.5)
WBC: 10.9 10*3/uL — ABNORMAL HIGH (ref 3.8–10.6)

## 2017-11-17 LAB — PHOSPHORUS: PHOSPHORUS: 2.3 mg/dL — AB (ref 2.5–4.6)

## 2017-11-17 LAB — MAGNESIUM: MAGNESIUM: 2.2 mg/dL (ref 1.7–2.4)

## 2017-11-17 MED ORDER — LORAZEPAM 2 MG/ML IJ SOLN
0.5000 mg | Freq: Once | INTRAMUSCULAR | Status: AC
  Administered 2017-11-17: 03:00:00 0.5 mg via INTRAVENOUS
  Filled 2017-11-17: qty 1

## 2017-11-17 MED ORDER — PREDNISONE 10 MG PO TABS: ORAL_TABLET | ORAL | 0 refills | Status: DC

## 2017-11-17 MED ORDER — AZITHROMYCIN 500 MG PO TABS
250.0000 mg | ORAL_TABLET | Freq: Every day | ORAL | Status: DC
  Administered 2017-11-17: 10:00:00 250 mg via ORAL
  Filled 2017-11-17: qty 1

## 2017-11-17 MED ORDER — POTASSIUM & SODIUM PHOSPHATES 280-160-250 MG PO PACK
2.0000 | PACK | ORAL | Status: AC
  Administered 2017-11-17 (×2): 2 via ORAL
  Filled 2017-11-17 (×3): qty 2

## 2017-11-17 MED ORDER — DOXYCYCLINE HYCLATE 100 MG PO TBEC: 100.0000 mg | DELAYED_RELEASE_TABLET | Freq: Two times a day (BID) | ORAL | 0 refills | Status: AC

## 2017-11-17 MED ORDER — BUDESONIDE-FORMOTEROL FUMARATE 80-4.5 MCG/ACT IN AERO: 2.0000 | INHALATION_SPRAY | Freq: Two times a day (BID) | RESPIRATORY_TRACT | 1 refills | Status: DC

## 2017-11-17 MED ORDER — ALBUTEROL SULFATE HFA 108 (90 BASE) MCG/ACT IN AERS: 2.0000 | INHALATION_SPRAY | Freq: Four times a day (QID) | RESPIRATORY_TRACT | 1 refills | Status: DC | PRN

## 2017-11-17 MED ORDER — METOPROLOL TARTRATE 5 MG/5ML IV SOLN
2.5000 mg | Freq: Once | INTRAVENOUS | Status: AC
  Administered 2017-11-17: 2.5 mg via INTRAVENOUS
  Filled 2017-11-17: qty 5

## 2017-11-17 NOTE — Progress Notes (Signed)
MD paged patient's heart rate ranging from 120 to 144 along with new onset A-fib.  MD placed order for Ativan 0.5mg , and Metoprolol 2.5mg .  EKG obtained shows A-fib with RVR.

## 2017-11-17 NOTE — Progress Notes (Signed)
Dr. Marcille Blanco called about EKG results. Pt in A-fib with RVR. Per Dr. Marcille Blanco no new orders.

## 2017-11-17 NOTE — Progress Notes (Signed)
MEDICATION RELATED CONSULT NOTE - INITIAL   Pharmacy Consult for electrolyte management Indication: hypokalemia  No Known Allergies  Patient Measurements: Height: 5\' 6"  (167.6 cm) Weight: 140 lb 9.6 oz (63.8 kg) IBW/kg (Calculated) : 63.8 Adjusted Body Weight:   Vital Signs: Temp: 98.4 F (36.9 C) (06/17 0235) Temp Source: Oral (06/17 0235) BP: 100/79 (06/17 0420) Pulse Rate: 112 (06/17 0420) Intake/Output from previous day: 06/16 0701 - 06/17 0700 In: 640 [P.O.:240; IV Piggyback:400] Out: 500 [Urine:500] Intake/Output from this shift: No intake/output data recorded.  Labs: Recent Labs    11/16/17 0857 11/17/17 0423  WBC 8.6 10.9*  HGB 15.6 14.4  HCT 44.8 40.7  PLT 180 164  CREATININE 1.04 0.60*  MG 1.7 2.2  PHOS 2.5 2.3*  ALBUMIN 3.3* 2.8*  PROT 6.6 5.7*  AST 54* 35  ALT 38 29  ALKPHOS 40 37*  BILITOT 0.5 0.3   Estimated Creatinine Clearance: 83.1 mL/min (A) (by C-G formula based on SCr of 0.6 mg/dL (L)).   Microbiology: Recent Results (from the past 720 hour(s))  Culture, blood (Routine x 2)     Status: None (Preliminary result)   Collection Time: 11/16/17  8:58 AM  Result Value Ref Range Status   Specimen Description BLOOD RIGHT ARM  Final   Special Requests   Final    BOTTLES DRAWN AEROBIC AND ANAEROBIC Blood Culture results may not be optimal due to an excessive volume of blood received in culture bottles   Culture   Final    NO GROWTH < 24 HOURS Performed at [facility], [address], [place], [place] [address]    Report Status PENDING  Incomplete  Culture, blood (Routine x 2)     Status: None (Preliminary result)   Collection Time: 11/16/17  8:58 AM  Result Value Ref Range Status   Specimen Description BLOOD LEFT ARM  Final   Special Requests   Final    BOTTLES DRAWN AEROBIC AND ANAEROBIC Blood Culture adequate volume   Culture   Final    NO GROWTH < 24 HOURS Performed at [facility], [address], [place],   [address]    Report Status PENDING  Incomplete    Medical History: Past Medical History:  Diagnosis Date  . COPD (chronic obstructive pulmonary disease) (HCC)     Medications:  Infusions:  . azithromycin Stopped (11/16/17 1017)  . cefTRIAXone (ROCEPHIN)  IV Stopped (11/16/17 1017)    Assessment: 67 yom cc fever/lethargy with PMH COPD, current smoker and EtOH about 12 beers per day. CXR negative. Low K noted, pharmacy consulted to manage electrolytes.  Goal of Therapy:  K 3.5 to 5 Ca 8.9 to 10.3 Mg 1.7 to 2.4 Phos 2.5 to 4.6  Plan:  EDP gave KCl 10 mEq IV x 1, and admitting ordered Klor-Con 40 mEq po x 1. Give those doses. Give calcium carbonate 500 mg of elemental calcium po BID WF x 2 doses. Add-on Mg/Phos. Will recheck K at 20:00. Recheck all electrolytes tomorrow with AM labs.  11/16/2017 14:30 add-on Mg 1.7, phos 2.5. No supplement indicated at this point. Recheck electrolytes as above.   11/16/2017 20:04 K 3.9. Recheck electrolytes with AM labs.   11/17/2017 04:23 K 3.9, Ca 7.7, adjusted calcium 8.6, phos 2.3, Mg 2.2. Patient will receive second dose of calcium this morning, will schedule one additional PM dose. Will give phos-nak 2 packet po Q4H x 2 doses and recheck all electrolytes tomorrow with AM labs.   Ovid Curd  A Gabryel Files, Pharm.D., BCPS Clinical Pharmacist 11/17/2017,7:14 AM

## 2017-11-17 NOTE — Progress Notes (Signed)
PHARMACIST - PHYSICIAN COMMUNICATION DR:   Verdell Carmine CONCERNING: Antibiotic IV to Oral Route Change Policy  RECOMMENDATION: This patient is receiving azithromcyin by the intravenous route.  Based on criteria approved by the Pharmacy and Therapeutics Committee, the antibiotic(s) is/are being converted to the equivalent oral dose form(s).   DESCRIPTION: These criteria include:  Patient being treated for a respiratory tract infection, urinary tract infection, cellulitis or clostridium difficile associated diarrhea if on metronidazole  The patient is not neutropenic and does not exhibit a GI malabsorption state  The patient is eating (either orally or via tube) and/or has been taking other orally administered medications for a least 24 hours  The patient is improving clinically and has a Tmax < 100.5  If you have questions about this conversion, please contact the Pharmacy Department  []   [phone] )  [facility] Na [x]   [phone] )  [facility] []   [phone] )  [facility] []   [phone] )  [facility] []   [phone] )  [facility]

## 2017-11-17 NOTE — Progress Notes (Signed)
Telemetry is now showing patient in sinus rhythm with HR of 81.

## 2017-11-17 NOTE — Evaluation (Signed)
Physical Therapy Evaluation Patient Details Name: Rodney Howe MRN: [id number] DOB: [birth year] Today's Date: 11/17/2017   History of Present Illness  pt presented to the ED 11/16/17 where he complained respiratory symptoms. Pt has a history or COPD, ankle surgery, smokes 2 packs a day, and drinks 12 beers a day. Pt was diagnoses with sepsis secondary to pneumonia.    Clinical Impression  Pt is a pleasant 67 year old male who was admitted 11/16/17 with sepsis secondary to pneumonia. Pt at his baseline is very active and states that he does not require an AD. Pt states that he "very rarely" has falls when climbing on objects or when he coughs until he "blacks out." Pt independent with all bed mobility and transfers however displays impulsivity during and requires cuing for safety. Pt performs standing gait exercises including balance with a narrowed BOS and marching without issue or LOB. Pt amb 150 with CGA and one seated rest break however fatigued toward end of session. Pts vitals monitored throughout, O2 saturation remained WNL throughout except when attemping to wean of of 2L via [place] in seated position pt desaturated to mid 80s, 2L was reapplied. Pts HR was a limiting factor as it elevated during activity as high as the 160s before rest break. PT discussed with nursing the need for continued mobility with close vital monitoring. Pt does not display any physical deficits at this time that would require follow up PT.    Follow Up Recommendations No PT follow up    Equipment Recommendations  None recommended by PT    Recommendations for Other Services       Precautions / Restrictions Precautions Precautions: None Restrictions Weight Bearing Restrictions: No      Mobility  Bed Mobility Overal bed mobility: Independent             General bed mobility comments: pt independent with all bed mobility including scooting, rolling, sit<>stand. Pt O2 saturation 94% on 2L prior to  supine>sit, 91% on 2L HR 87 after.  Transfers Overall transfer level: Independent Equipment used: None             General transfer comment: pt independent however slightly impulsive during sit<>stand. requires verbal cuing for safety. Pt O2 saturation 91% HR 93 on 2L after transfer sit>stand  Ambulation/Gait Ambulation/Gait assistance: Min guard Gait Distance (Feet): 150 Feet Assistive device: None Gait Pattern/deviations: WFL(Within Functional Limits)     General Gait Details: pt demonstrates WNL gait pattern at the beginning of amb, ambulates 150' total with CGA and no AD. pt required a seated rest break during amb. O2 saturation was WNL throughout remaining in the low 90s however HR elevated to the 160s prior to rest. Pt asymptomatic throughout however once back in room stated that he felt "foggy." Pt gait pattern worsened toward end of amb and displayed a shuffling gait. Pt displayed no LOB throughout. At end of amb 93% O2 saturation on 2L with HR in the 110's. Pt states that he fatigued towards end.  Stairs            Wheelchair Mobility    Modified Rankin (Stroke Patients Only)       Balance Overall balance assessment: Independent(no gross LOB maintains balance tested in tandem stance)  Pertinent Vitals/Pain Pain Assessment: No/denies pain    Home Living Family/patient expects to be discharged to:: Private residence Living Arrangements: Spouse/significant other Available Help at Discharge: Family Type of Home: House Home Access: Stairs to enter Entrance Stairs-Rails: None Entrance Stairs-Number of Steps: 3 Home Layout: One level Home Equipment: None      Prior Function Level of Independence: Independent         Comments: pt very active without AD, 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 Pt does state that he "very rarely" has falls  while picking junk yards or when he coughs until he "blacks out"     Hand Dominance        Extremity/Trunk Assessment   Upper Extremity Assessment Upper Extremity Assessment: Overall WFL for tasks assessed(B 5/5 elbow flexion, extension as well as shoulder flexion)    Lower Extremity Assessment Lower Extremity Assessment: Overall WFL for tasks assessed(B 5/5 hip flexion, knee flexion and extension)       Communication   Communication: No difficulties  Cognition Arousal/Alertness: Awake/alert Behavior During Therapy: WFL for tasks assessed/performed Overall Cognitive Status: Within Functional Limits for tasks assessed                                 General Comments: impulsive      General Comments      Exercises Other Exercises Other Exercises: Standing pre gait exercises including standing with narrow BOS, and tandem stance for 10 seconds. Pt amb 150' with CGA and close monitoring of vitals. Pt fatigued toward end of exercises. exercise limited by HR elevation into the 160s   Assessment/Plan    PT Assessment Patent does not need any further PT services  PT Problem List         PT Treatment Interventions      PT Goals (Current goals can be found in the Care Plan section)  Acute Rehab PT Goals Patient Stated Goal: "live until I am 89" PT Goal Formulation: With patient Time For Goal Achievement: 12/01/17 Potential to Achieve Goals: Good    Frequency     Barriers to discharge        Co-evaluation               AM-PAC PT "6 Clicks" Daily Activity  Outcome Measure Difficulty turning over in bed (including adjusting bedclothes, sheets and blankets)?: None Difficulty moving from lying on back to sitting on the side of the bed? : None Difficulty sitting down on and standing up from a chair with arms (e.g., wheelchair, bedside commode, etc,.)?: None Help needed moving to and from a bed to chair (including a wheelchair)?: None Help needed  walking in hospital room?: None Help needed climbing 3-5 steps with a railing? : None 6 Click Score: 24    End of Session Equipment Utilized During Treatment: Gait belt;Oxygen Activity Tolerance: Patient tolerated treatment well;No increased pain;Patient limited by fatigue Patient left: in chair;with call bell/phone within reach;with chair alarm set Nurse Communication: Mobility status;Other (comment)(need for continued amb with nursing) PT Visit Diagnosis: Unsteadiness on feet (R26.81);Other abnormalities of gait and mobility (R26.89);Repeated falls (R29.6)    Time: 3474-2595 PT Time Calculation (min) (ACUTE ONLY): 32 min   Charges:         PT G Codes:        Millette Halberstam, SPT   Eyva Califano 11/17/2017, 10:03 AM

## 2017-11-17 NOTE — Discharge Summary (Signed)
[place] [facility] [place] NAME: Rodney Howe    MR#:  [id number]  DATE OF BIRTH:  16-Mar-1951  DATE OF ADMISSION:  11/16/2017 ADMITTING PHYSICIAN: Nicholes Mango, MD  DATE OF DISCHARGE: No discharge date for patient encounter.  PRIMARY CARE PHYSICIAN: Center, [facility]    ADMISSION DIAGNOSIS:  COPD with acute exacerbation ([place]) [J44.1] Severe sepsis ([place]) [A41.9, R65.20]  DISCHARGE DIAGNOSIS:  Active Problems:   Sepsis ([place])   SECONDARY DIAGNOSIS:   Past Medical History:  Diagnosis Date  . COPD (chronic obstructive pulmonary disease) [facility])     HOSPITAL COURSE:   67 year old male with past medical history of alcohol abuse, tobacco abuse, COPD who presented to the hospital due to shortness of breath.  1.  COPD exacerbation-this was the cause of patient's worsening shortness of breath. -Patient was admitted to the hospital started on IV Solu-Medrol, scheduled duo nebs, Pulmicort nebs. -Patient has improved.  Patient was also given empiric antibiotics with ceftriaxone, Zithromax.  His chest x-ray although admission showed no acute pathology. - Patient has improved and therefore being discharged on Oral Pred taper, Doxy and new inhalers (Albuterol, Symbicort).   - pt. Did not qualify for Home O2.   2. ETOH abuse - pt. Was placed on CIWA protocol.  No evidence of withdrawal presently.  - pt. Was also given thiamine and folate while in the hospital.   3. GERD - Pt. Will cont. Protonix.   4. Hyperlipidemia - cont. Atorvastatin  5. Neuropathy - pt. Will cont. Neurontin.    DISCHARGE CONDITIONS:   Stable  CONSULTS OBTAINED:    DRUG ALLERGIES:  No Known Allergies  DISCHARGE MEDICATIONS:   Allergies as of 11/17/2017   No Known Allergies     Medication List    TAKE these medications   albuterol 108 (90 Base) MCG/ACT inhaler Commonly known as:  VENTOLIN HFA Inhale 2 puffs into the lungs every 6 (six) hours as needed  for wheezing or shortness of breath.   aspirin EC 81 MG tablet Take 81 mg by mouth daily.   atorvastatin 40 MG tablet Commonly known as:  LIPITOR Take 40 mg by mouth every evening.   budesonide-formoterol 80-4.5 MCG/ACT inhaler Commonly known as:  SYMBICORT Inhale 2 puffs into the lungs 2 (two) times daily.   doxycycline 100 MG EC tablet Commonly known as:  DORYX Take 1 tablet (100 mg total) by mouth 2 (two) times daily for 5 days.   gabapentin 300 MG capsule Commonly known as:  NEURONTIN Take 300 mg by mouth 2 (two) times daily.   omeprazole 20 MG capsule Commonly known as:  PRILOSEC Take 20 mg by mouth daily.   predniSONE 10 MG tablet Commonly known as:  DELTASONE Label  & dispense according to the schedule below. 5 Pills PO for 1 day then, 4 Pills PO for 1 day, 3 Pills PO for 1 day, 2 Pills PO for 1 day, 1 Pill PO for 1 days then STOP.         DISCHARGE INSTRUCTIONS:   DIET:  Cardiac diet  DISCHARGE CONDITION:  Stable  ACTIVITY:  Activity as tolerated  OXYGEN:  Home Oxygen: No.   Oxygen Delivery: room air  DISCHARGE LOCATION:  home   If you experience worsening of your admission symptoms, develop shortness of breath, life threatening emergency, suicidal or homicidal thoughts you must seek medical attention immediately by calling 911 or calling your MD immediately  if symptoms less severe.  You Must read complete instructions/literature along with all the possible adverse reactions/side effects for all the Medicines you take and that have been prescribed to you. Take any new Medicines after you have completely understood and accpet all the possible adverse reactions/side effects.   Please note  You were cared for by a hospitalist during your hospital stay. If you have any questions about your discharge medications or the care you received while you were in the hospital after you are discharged, you can call the unit and asked to speak with the hospitalist  on call if the hospitalist that took care of you is not available. Once you are discharged, your primary care physician will handle any further medical issues. Please note that NO REFILLS for any discharge medications will be authorized once you are discharged, as it is imperative that you return to your primary care physician (or establish a relationship with a primary care physician if you do not have one) for your aftercare needs so that they can reassess your need for medications and monitor your lab values.     Today   Shortness of breath improved. NO acute complains.  No wheezing.   VITAL SIGNS:  Blood pressure 115/78, pulse 90, temperature 98.4 F (36.9 C), temperature source Oral, resp. rate 18, height 5\' 6"  (1.676 m), weight 63.8 kg (140 lb 9.6 oz), SpO2 92 %.  I/O:    Intake/Output Summary (Last 24 hours) at 11/17/2017 1412 Last data filed at 11/17/2017 1336 Gross per 24 hour  Intake 753.33 ml  Output 500 ml  Net 253.33 ml    PHYSICAL EXAMINATION:   GENERAL:  67 y.o.-year-old patient lying in the bed with no acute distress.  EYES: Pupils equal, round, reactive to light and accommodation. No scleral icterus. Extraocular muscles intact.  HEENT: Head atraumatic, normocephalic. Oropharynx and nasopharynx clear.  NECK:  Supple, no jugular venous distention. No thyroid enlargement, no tenderness.  LUNGS: Normal breath sounds bilaterally, no wheezing, rales,rhonchi. No use of accessory muscles of respiration.  CARDIOVASCULAR: S1, S2 normal. No murmurs, rubs, or gallops.  ABDOMEN: Soft, non-tender, non-distended. Bowel sounds present. No organomegaly or mass.  EXTREMITIES: No pedal edema, cyanosis, or clubbing.  NEUROLOGIC: Cranial nerves II through XII are intact. No focal motor or sensory defecits b/l.  PSYCHIATRIC: The patient is alert and oriented x 3. SKIN: No obvious rash, lesion, or ulcer.   DATA REVIEW:   CBC Recent Labs  Lab 11/17/17 0423  WBC 10.9*  HGB 14.4   HCT 40.7  PLT 164    Chemistries  Recent Labs  Lab 11/17/17 0423  NA 131*  K 3.8  CL 104  CO2 22  GLUCOSE 174*  BUN 9  CREATININE 0.60*  CALCIUM 7.7*  MG 2.2  AST 35  ALT 29  ALKPHOS 37*  BILITOT 0.3    Cardiac Enzymes No results for input(s): TROPONINI in the last 168 hours.  Microbiology Results  Results for orders placed or performed during the hospital encounter of 11/16/17  Culture, blood (Routine x 2)     Status: None (Preliminary result)   Collection Time: 11/16/17  8:58 AM  Result Value Ref Range Status   Specimen Description BLOOD RIGHT ARM  Final   Special Requests   Final    BOTTLES DRAWN AEROBIC AND ANAEROBIC Blood Culture results may not be optimal due to an excessive volume of blood received in culture bottles   Culture   Final    NO GROWTH < 24 HOURS  Performed at [facility], [address] [place], [place] [address]    Report Status PENDING  Incomplete  Culture, blood (Routine x 2)     Status: None (Preliminary result)   Collection Time: 11/16/17  8:58 AM  Result Value Ref Range Status   Specimen Description BLOOD LEFT ARM  Final   Special Requests   Final    BOTTLES DRAWN AEROBIC AND ANAEROBIC Blood Culture adequate volume   Culture   Final    NO GROWTH < 24 HOURS Performed at [facility], [address], [place], [place] [address]    Report Status PENDING  Incomplete    RADIOLOGY:  Ct Abdomen Pelvis W Contrast  Result Date: 11/16/2017 CLINICAL DATA:  Fever. Tachycardia. Right-sided abdominal soreness. Question peritonitis. EXAM: CT ABDOMEN AND PELVIS WITH CONTRAST TECHNIQUE: Multidetector CT imaging of the abdomen and pelvis was performed using the standard protocol following bolus administration of intravenous contrast. CONTRAST:  169mL OMNIPAQUE IOHEXOL 300 MG/ML  SOLN COMPARISON:  12/23/2010 FINDINGS: Lower chest: Normal heart size without pericardial or pleural effusion. Emphysema. Hepatobiliary: Low-density liver  lesions are similar and likely cysts. Normal gallbladder, without biliary ductal dilatation. Pancreas: Normal, without mass or ductal dilatation. Spleen: Normal in size, without focal abnormality. Adrenals/Urinary Tract: Normal adrenal glands. Normal kidneys, without hydronephrosis. Normal urinary bladder. Stomach/Bowel: Proximal gastric underdistention. Scattered colonic diverticula. Normal terminal ileum and appendix. Normal small bowel. Vascular/Lymphatic: Aortic and branch vessel atherosclerosis. Atherosclerotic calcifications at the origin of both renal arteries. No abdominopelvic adenopathy. Reproductive: Normal prostate. Other: No significant free fluid. Musculoskeletal: Degenerative disc disease at the lumbosacral junction. IMPRESSION: 1.  No acute process in the abdomen or pelvis. 2. Aortic atherosclerosis (ICD10-I70.0) and emphysema (ICD10-J43.9). Electronically Signed   By: Abigail Miyamoto M.D.   On: 11/16/2017 10:26   Dg Chest Portable 1 View  Result Date: 11/16/2017 CLINICAL DATA:  Fatigue, fever, shortness of breath EXAM: PORTABLE CHEST 1 VIEW COMPARISON:  12/23/2010 FINDINGS: Heart and mediastinal contours are within normal limits. No focal opacities or effusions. No acute bony abnormality. IMPRESSION: No active disease. Electronically Signed   By: Rolm Baptise M.D.   On: 11/16/2017 09:11      Management plans discussed with the patient, family and they are in agreement.  CODE STATUS:     Code Status Orders  (From admission, onward)        Start     Ordered   11/16/17 1204  Full code  Continuous     11/16/17 1203    Code Status History    This patient has a current code status but no historical code status.      TOTAL TIME TAKING CARE OF THIS PATIENT: 40 minutes.    Henreitta Leber M.D on 11/17/2017 at 2:12 PM  Between 7am to 6pm - Pager - [phone]  After 6pm go to [email or web address] - [facility] Hospitalists  Office   [phone]  CC: Primary care physician; Center, [facility]

## 2017-11-17 NOTE — Progress Notes (Signed)
Patient discharged home per MD order. Prescriptions given to patient. All discharge instructions given and all questions answered. 

## 2017-11-18 LAB — URINE CULTURE: CULTURE: NO GROWTH

## 2017-11-18 LAB — HIV ANTIBODY (ROUTINE TESTING W REFLEX): HIV Screen 4th Generation wRfx: NONREACTIVE

## 2017-11-21 LAB — CULTURE, BLOOD (ROUTINE X 2)
Culture: NO GROWTH
Culture: NO GROWTH
SPECIAL REQUESTS: ADEQUATE

## 2017-11-24 ENCOUNTER — Emergency Department
Admission: EM | Admit: 2017-11-24 | Discharge: 2017-11-24 | Disposition: A | Discharge status: Home / Self Care | Payer: Medicare Other | Attending: Emergency Medicine | Admitting: Emergency Medicine

## 2017-11-24 ENCOUNTER — Emergency Department: Payer: Medicare Other

## 2017-11-24 ENCOUNTER — Encounter: Payer: Self-pay | Admitting: Emergency Medicine

## 2017-11-24 ENCOUNTER — Other Ambulatory Visit: Payer: Self-pay

## 2017-11-24 DIAGNOSIS — J449 Chronic obstructive pulmonary disease, unspecified: Secondary | ICD-10-CM | POA: Diagnosis not present

## 2017-11-24 DIAGNOSIS — R531 Weakness: Secondary | ICD-10-CM

## 2017-11-24 DIAGNOSIS — F17210 Nicotine dependence, cigarettes, uncomplicated: Secondary | ICD-10-CM | POA: Insufficient documentation

## 2017-11-24 LAB — CBC WITH DIFFERENTIAL/PLATELET
Basophils Absolute: 0.2 10*3/uL — ABNORMAL HIGH (ref 0–0.1)
Basophils Relative: 1 %
EOS PCT: 0 %
Eosinophils Absolute: 0.1 10*3/uL (ref 0–0.7)
HCT: 45.8 % (ref 40.0–52.0)
Hemoglobin: 15.6 g/dL (ref 13.0–18.0)
LYMPHS ABS: 2.5 10*3/uL (ref 1.0–3.6)
LYMPHS PCT: 16 %
MCH: 32.7 pg (ref 26.0–34.0)
MCHC: 34.1 g/dL (ref 32.0–36.0)
MCV: 95.9 fL (ref 80.0–100.0)
Monocytes Absolute: 1.7 10*3/uL — ABNORMAL HIGH (ref 0.2–1.0)
Monocytes Relative: 11 %
Neutro Abs: 11.3 10*3/uL — ABNORMAL HIGH (ref 1.4–6.5)
Neutrophils Relative %: 72 %
Platelets: 588 10*3/uL — ABNORMAL HIGH (ref 150–440)
RBC: 4.78 MIL/uL (ref 4.40–5.90)
RDW: 13 % (ref 11.5–14.5)
WBC: 15.7 10*3/uL — ABNORMAL HIGH (ref 3.8–10.6)

## 2017-11-24 LAB — TSH: TSH: 3.094 u[IU]/mL (ref 0.350–4.500)

## 2017-11-24 LAB — HEPATIC FUNCTION PANEL
ALK PHOS: 49 U/L (ref 38–126)
ALT: 37 U/L (ref 17–63)
AST: 23 U/L (ref 15–41)
Albumin: 2.9 g/dL — ABNORMAL LOW (ref 3.5–5.0)
BILIRUBIN DIRECT: 0.1 mg/dL (ref 0.1–0.5)
BILIRUBIN TOTAL: 0.5 mg/dL (ref 0.3–1.2)
Indirect Bilirubin: 0.4 mg/dL (ref 0.3–0.9)
Total Protein: 6.1 g/dL — ABNORMAL LOW (ref 6.5–8.1)

## 2017-11-24 LAB — URINALYSIS, COMPLETE (UACMP) WITH MICROSCOPIC
Bacteria, UA: NONE SEEN
Bilirubin Urine: NEGATIVE
Glucose, UA: NEGATIVE mg/dL
Hgb urine dipstick: NEGATIVE
Ketones, ur: NEGATIVE mg/dL
Leukocytes, UA: NEGATIVE
Nitrite: NEGATIVE
PH: 6 (ref 5.0–8.0)
Protein, ur: NEGATIVE mg/dL
SPECIFIC GRAVITY, URINE: 1.011 (ref 1.005–1.030)
SQUAMOUS EPITHELIAL / LPF: NONE SEEN (ref 0–5)

## 2017-11-24 LAB — BASIC METABOLIC PANEL
Anion gap: 8 (ref 5–15)
BUN: 14 mg/dL (ref 6–20)
CHLORIDE: 102 mmol/L (ref 101–111)
CO2: 23 mmol/L (ref 22–32)
Calcium: 8.7 mg/dL — ABNORMAL LOW (ref 8.9–10.3)
Creatinine, Ser: 0.64 mg/dL (ref 0.61–1.24)
GFR calc Af Amer: >60 mL/min (ref >60)
GFR calc non Af Amer: >60 mL/min (ref >60)
GLUCOSE: 121 mg/dL — AB (ref 65–99)
POTASSIUM: 3.8 mmol/L (ref 3.5–5.1)
Sodium: 133 mmol/L — ABNORMAL LOW (ref 135–145)

## 2017-11-24 LAB — TROPONIN I: Troponin I: <0.03 ng/mL (ref <0.03)

## 2017-11-24 MED ORDER — SODIUM CHLORIDE 0.9 % IV BOLUS
1000.0000 mL | Freq: Once | INTRAVENOUS | Status: AC
  Administered 2017-11-24: 1000 mL via INTRAVENOUS

## 2017-11-24 MED ORDER — ALBUTEROL SULFATE (2.5 MG/3ML) 0.083% IN NEBU: 5.0000 mg | INHALATION_SOLUTION | Freq: Once | RESPIRATORY_TRACT | Status: DC

## 2017-11-24 NOTE — ED Notes (Signed)
ED Provider at bedside. 

## 2017-11-24 NOTE — ED Notes (Signed)
Pam, EDT, at bedside to obtain orthostatic vital signs.

## 2017-11-24 NOTE — ED Provider Notes (Signed)
[facility] Emergency Department Provider Note  ____________________________________________  Time seen: Approximately 4:09 PM  I have reviewed the triage vital signs and the nursing notes.   HISTORY  Chief Complaint Fatigue    HPI Rodney Howe is a 67 y.o. male with a history of COPD, alcohol dependence, recent hospitalization for pneumonia and sepsis, presenting for generalized weakness.  The patient was hospitalized for 2 days 1 week ago for pneumonia with sepsis and was discharged home with prednisone and azithromycin, which he completed.  Initially, he was feeling better and went back to work and his normal level of activity immediately.  He also acutely and abruptly quit smoking and drinking alcohol.  He reports that for the last few days, he has been feeling generalized weakness and felt like his "eyes were tired."  He has not had any fevers or chills, change in his nonproductive cough, lightheadedness or syncope, chest pain, palpitations, dysuria, nausea and vomiting.  He is eating and drinking normally but does not drink a lot of water.  Past Medical History:  Diagnosis Date  . COPD (chronic obstructive pulmonary disease) [facility])     Patient Active Problem List   Diagnosis Date Noted  . Sepsis ([place]) 11/16/2017    Past Surgical History:  Procedure Laterality Date  . ANKLE SURGERY      Current Outpatient Rx  . Order #: [id number] Class: Print  . Order #: [id number] Class: Historical Med  . Order #: [id number] Class: Historical Med  . Order #: [id number] Class: Print  . Order #: [id number] Class: Historical Med  . Order #: [id number] Class: Historical Med  . Order #: [id number] Class: Print    Allergies Patient has no known allergies.  No family history on file.  Social History Social History   Tobacco Use  . Smoking status: Current Every Day Smoker    Packs/day: 2.00    Years: 49.00    Pack years: 98.00    Types: Cigarettes  . Smokeless  tobacco: Never Used  Substance Use Topics  . Alcohol use: Yes    Alcohol/week: 21.6 oz    Types: 36 Cans of beer per week    Frequency: Never  . Drug use: Not Currently    Review of Systems Constitutional: No fever/chills.  Denies or syncope. Eyes: No visual changes.  No eye discharge. ENT: No sore throat. No congestion or rhinorrhea. Cardiovascular: Denies chest pain. Denies palpitations. Respiratory: Denies shortness of breath.  Active chronic unchanged cough. Gastrointestinal: No abdominal pain.  No nausea, no vomiting.  No diarrhea.  No constipation.  Eating and drinking normally. Genitourinary: Negative for dysuria. Musculoskeletal: Negative for back pain. Skin: Negative for rash. Neurological: Negative for headaches. No focal numbness, tingling or weakness.     ____________________________________________   PHYSICAL EXAM:  VITAL SIGNS: ED Triage Vitals  Enc Vitals Group     BP 11/24/17 1358 (!) 108/93     Pulse Rate 11/24/17 1358 90     Resp 11/24/17 1358 16     Temp 11/24/17 1358 98.6 F (37 C)     Temp Source 11/24/17 1358 Oral     SpO2 11/24/17 1358 92 %     Weight 11/24/17 1359 145 lb (65.8 kg)     Height 11/24/17 1359 5\' 6"  (1.676 m)     Head Circumference --      Peak Flow --      Pain Score 11/24/17 1359 0     Pain Loc --      Pain Edu? --  Excl. in [place]? --     Constitutional: Alert and oriented. Answers questions appropriately.  The patient is chronically ill-appearing, smells of tobacco, but nontoxic.  He is able to ambulate without significant difficulty and his ambulatory pulse ox reading is 91%. Eyes: Conjunctivae are normal.  EOMI. No scleral icterus. Head: Atraumatic. Nose: No congestion/rhinnorhea. Mouth/Throat: Mucous membranes are mildly dry.  Neck: No stridor.  Supple.  No JVD.  No meningismus. Cardiovascular: Normal rate, regular rhythm. No murmurs, rubs or gallops.  Respiratory: Normal respiratory effort.  No accessory muscle use or  retractions. Lungs CTAB.  No wheezes, rales or ronchi. Gastrointestinal: Soft, nontender and nondistended.  No guarding or rebound.  No peritoneal signs. Musculoskeletal: No LE edema. No ttp in the calves or palpable cords.  Negative Homan's sign. Neurologic:  A&Ox3.  Speech is clear.  Face and smile are symmetric.  EOMI.  Moves all extremities well.  Normal gait without ataxia. Skin:  Skin is warm, dry and intact. No rash noted. Psychiatric: Mood and affect are normal. Speech and behavior are normal.  Normal judgement.  ____________________________________________   LABS (all labs ordered are listed, but only abnormal results are displayed)  Labs Reviewed  CBC WITH DIFFERENTIAL/PLATELET - Abnormal; Notable for the following components:      Result Value   WBC 15.7 (*)    Platelets 588 (*)    Neutro Abs 11.3 (*)    Monocytes Absolute 1.7 (*)    Basophils Absolute 0.2 (*)    All other components within normal limits  BASIC METABOLIC PANEL - Abnormal; Notable for the following components:   Sodium 133 (*)    Glucose, Bld 121 (*)    Calcium 8.7 (*)    All other components within normal limits  HEPATIC FUNCTION PANEL - Abnormal; Notable for the following components:   Total Protein 6.1 (*)    Albumin 2.9 (*)    All other components within normal limits  URINALYSIS, COMPLETE (UACMP) WITH MICROSCOPIC - Abnormal; Notable for the following components:   Color, Urine YELLOW (*)    APPearance CLEAR (*)    All other components within normal limits  TROPONIN I  TSH   ____________________________________________  EKG  ED ECG REPORT I, Eula Listen, the attending physician, personally viewed and interpreted this ECG.   Date: 11/24/2017  EKG Time: 1407  Rate: 93  Rhythm: normal sinus rhythm  Axis: normal  Intervals:none  ST&T Change: No STEMI  ____________________________________________  RADIOLOGY  Dg Chest 2 View  Result Date: 11/24/2017 CLINICAL DATA:  Status  post pneumonia EXAM: CHEST - 2 VIEW COMPARISON:  11/16/2017 FINDINGS: There is hazy right lower lobe airspace disease likely reflecting atelectasis. There is no focal consolidation there is diffuse interstitial thickening. There is no pleural effusion or pneumothorax. The heart and mediastinal contours are unremarkable. The osseous structures are unremarkable. IMPRESSION: Hazy right lower lobe airspace disease likely reflecting atelectasis. Electronically Signed   By: Kathreen Devoid   On: 11/24/2017 14:23    ____________________________________________   PROCEDURES  Procedure(s) performed: None  Procedures  Critical Care performed: No ____________________________________________   INITIAL IMPRESSION / ASSESSMENT AND PLAN / ED COURSE  Pertinent labs & imaging results that were available during my care of the patient were reviewed by me and considered in my medical decision making (see chart for details).  67 y.o. ill with recent hospitalization for pneumonia with sepsis, who immediately return back to his normal activities and abruptly quit smoking and drinking, presenting with generalized  fatigue.  Overall, the patient is hemodynamically stable.  It is likely that his symptoms are because his body is still recovering from his pneumonia and hospitalization, and additionally because he has abruptly stopped smoking and drinking alcohol.  The patient has not been having any seizures, shakiness, and does not have vital signs that are concerning for DTs.  His EKG does not show any arrhythmia, and his troponin is pending.  ACS or MI is less likely.  His chest x-ray does not show a new pneumonia; it does show changes that are consistent with atelectasis.  Clinically, the patient has not been having fevers, has no change in his cough, and his O2 sats remain greater than 91% even with ambulation so recurrent pneumonia is less likely.  PE is considered but also unlikely.  The patient has a sodium of 133, and  I will encourage oral hydration for him at home and give him a liter of fluids here; this is likely due to some mild dehydration.  The patient does have a white blood cell count of 15.7 but he has been on prednisone since discharge.  Once the patient's work-up is complete, we will plan reevaluation for final disposition.  ED COURSE The patient's work-up in the emergency department has been reassuring and I do not seen any evidence for recurrence of disease or new disease today.  At this time, the patient will be discharged home with instructions for close PMD follow-up, we have discussed return precautions. ____________________________________________  FINAL CLINICAL IMPRESSION(S) / ED DIAGNOSES  Final diagnoses:  Generalized weakness         NEW MEDICATIONS STARTED DURING THIS VISIT:  Discharge Medication List as of 11/24/2017  6:01 PM        Eula Listen, MD 11/24/17 2252

## 2017-11-24 NOTE — ED Notes (Signed)
Pt reports fatigue since last Tuesday. Pt was discharged Monday from hospital s/p admission from sepsis & pneumonia and states "that's why I came in originally - my eyes were just tired."

## 2017-11-24 NOTE — Discharge Instructions (Addendum)
Please drink plenty of fluids to stay well-hydrated.  Please get plenty of rest before returning to full activity levels.  Return to the emergency department if you develop severe pain, lightheadedness or fainting, shortness of breath, fever, or any other symptoms concerning to you.

## 2017-11-24 NOTE — ED Triage Notes (Signed)
Pt here with c/o of increasing fatigue over the past few days, coughed up greenish brown thick sputum this afternoon, was seen here last week for the same and diagnosed with pneumonia and sepsis. Pt states he just has not felt like he's getting any better. Appears in NAD.

## 2017-12-16 ENCOUNTER — Telehealth: Payer: Self-pay | Admitting: *Deleted

## 2017-12-16 DIAGNOSIS — Z87891 Personal history of nicotine dependence: Secondary | ICD-10-CM

## 2017-12-16 DIAGNOSIS — Z122 Encounter for screening for malignant neoplasm of respiratory organs: Secondary | ICD-10-CM

## 2017-12-16 NOTE — Telephone Encounter (Signed)
Received referral for initial lung cancer screening scan. Contacted patient and obtained smoking history,(current, 98 pack year) as well as answering questions related to screening process. Patient denies signs of lung cancer such as weight loss or hemoptysis. Patient denies comorbidity that would prevent curative treatment if lung cancer were found. Patient is scheduled for shared decision making visit and CT scan on 12/25/17.

## 2017-12-17 ENCOUNTER — Other Ambulatory Visit: Payer: Self-pay

## 2017-12-17 ENCOUNTER — Telehealth: Payer: Self-pay | Admitting: Gastroenterology

## 2017-12-17 DIAGNOSIS — Z86010 Personal history of colonic polyps: Secondary | ICD-10-CM

## 2017-12-17 NOTE — Telephone Encounter (Signed)
Gastroenterology Pre-Procedure Review  Request Date: 01/07/18   [facility] Requesting Physician: Dr. Vicente Males  PATIENT REVIEW QUESTIONS: The patient responded to the following health history questions as indicated:    1. Are you having any GI issues? no 2. Do you have a personal history of Polyps? yes (2015 @ [facility]) 3. Do you have a family history of Colon Cancer or Polyps? no 4. Diabetes Mellitus? no 5. Joint replacements in the past 12 months?no 6. Major health problems in the past 3 months?no 7. Any artificial heart valves, MVP, or defibrillator?no    MEDICATIONS & ALLERGIES:    Patient reports the following regarding taking any anticoagulation/antiplatelet therapy:   Plavix, Coumadin, Eliquis, Xarelto, Lovenox, Pradaxa, Brilinta, or Effient? no Aspirin? yes (81 mg)  Patient confirms/reports the following medications:  Current Outpatient Medications  Medication Sig Dispense Refill  . albuterol (VENTOLIN HFA) 108 (90 Base) MCG/ACT inhaler Inhale 2 puffs into the lungs every 6 (six) hours as needed for wheezing or shortness of breath. 1 Inhaler 1  . aspirin EC 81 MG tablet Take 81 mg by mouth daily.    Marland Kitchen atorvastatin (LIPITOR) 40 MG tablet Take 40 mg by mouth every evening.    . budesonide-formoterol (SYMBICORT) 80-4.5 MCG/ACT inhaler Inhale 2 puffs into the lungs 2 (two) times daily. 1 Inhaler 1  . gabapentin (NEURONTIN) 300 MG capsule Take 300 mg by mouth 2 (two) times daily.    Marland Kitchen omeprazole (PRILOSEC) 20 MG capsule Take 20 mg by mouth daily.    . predniSONE (DELTASONE) 10 MG tablet Label  & dispense according to the schedule below. 5 Pills PO for 1 day then, 4 Pills PO for 1 day, 3 Pills PO for 1 day, 2 Pills PO for 1 day, 1 Pill PO for 1 days then STOP. 15 tablet 0   No current facility-administered medications for this visit.     Patient confirms/reports the following allergies:  No Known Allergies  No orders of the defined types were placed in this encounter.   AUTHORIZATION  INFORMATION Primary Insurance: 1D#: Group #:  Secondary Insurance: 1D#: Group #:  SCHEDULE INFORMATION: Date: 01/07/18 Vicente Males Time: Location: [place]

## 2017-12-25 ENCOUNTER — Ambulatory Visit
Admission: RE | Admit: 2017-12-25 | Discharge: 2017-12-25 | Disposition: A | Discharge status: Home / Self Care | Payer: Medicare Other | Source: Ambulatory Visit | Attending: Nurse Practitioner | Admitting: Nurse Practitioner

## 2017-12-25 ENCOUNTER — Inpatient Hospital Stay: Payer: Medicare Other | Attending: Nurse Practitioner | Admitting: Nurse Practitioner

## 2017-12-25 DIAGNOSIS — Z87891 Personal history of nicotine dependence: Secondary | ICD-10-CM | POA: Diagnosis not present

## 2017-12-25 DIAGNOSIS — I700 Atherosclerosis of aorta: Secondary | ICD-10-CM | POA: Diagnosis not present

## 2017-12-25 DIAGNOSIS — I2510 Atherosclerotic heart disease of native coronary artery without angina pectoris: Secondary | ICD-10-CM | POA: Diagnosis not present

## 2017-12-25 DIAGNOSIS — J439 Emphysema, unspecified: Secondary | ICD-10-CM | POA: Insufficient documentation

## 2017-12-25 DIAGNOSIS — Z122 Encounter for screening for malignant neoplasm of respiratory organs: Secondary | ICD-10-CM | POA: Insufficient documentation

## 2017-12-25 NOTE — Progress Notes (Signed)
In accordance with CMS guidelines, patient has met eligibility criteria including age, absence of signs or symptoms of lung cancer.  Social History   Tobacco Use  . Smoking status: Current Every Day Smoker    Packs/day: 2.00    Years: 49.00    Pack years: 98.00    Types: Cigarettes  . Smokeless tobacco: Never Used  Substance Use Topics  . Alcohol use: Yes    Alcohol/week: 21.6 oz    Types: 36 Cans of beer per week    Frequency: Never  . Drug use: Not Currently      A shared decision-making session was conducted prior to the performance of CT scan. This includes one or more decision aids, includes benefits and harms of screening, follow-up diagnostic testing, over-diagnosis, false positive rate, and total radiation exposure.   Counseling on the importance of adherence to annual lung cancer LDCT screening, impact of co-morbidities, and ability or willingness to undergo diagnosis and treatment is imperative for compliance of the program.   Counseling on the importance of continued smoking cessation for former smokers; the importance of smoking cessation for current smokers, and information about tobacco cessation interventions have been given to patient including [place] and 1800 quit [place] programs.   Written order for lung cancer screening with LDCT has been given to the patient and any and all questions have been answered to the best of my abilities.    Yearly follow up will be coordinated by Burgess Estelle, Thoracic Navigator.  Beckey Rutter, DNP, AGNP-C [place] at [facility] [phone] (work cell) [phone] (office) 12/25/17 4:34 PM

## 2017-12-26 ENCOUNTER — Telehealth: Payer: Self-pay | Admitting: *Deleted

## 2017-12-26 NOTE — Telephone Encounter (Signed)
Called report:  IMPRESSION: 1. Nodular consolidation in the posterior right lower lobe. While this may be infectious or inflammatory in etiology, malignancy is also considered. Lung-RADS 4B, suspicious. Additional imaging evaluation or consultation with Pulmonology or Thoracic Surgery recommended. Consider short-term diagnostic CT chest without contrast in 3-4 weeks in further initial evaluation. These results will be called to the ordering clinician or representative by the Radiologist Assistant, and communication documented in the PACS or zVision Dashboard. 2. Possible mild subpleural fibrosis. Please correlate clinically for respiratory symptoms. 3. Aortic atherosclerosis (ICD10-170.0). Coronary artery calcification. 4.  Emphysema (ICD10-J43.9).   Electronically Signed   By: Lorin Picket M.D.   On: 12/26/2017 10:17

## 2017-12-29 ENCOUNTER — Telehealth: Payer: Self-pay | Admitting: *Deleted

## 2017-12-29 NOTE — Telephone Encounter (Signed)
Notified patient of LDCT lung cancer screening program results with recommendation for 4 week follow up imaging. Also notified of incidental findings noted below and is encouraged to discuss further with PCP who will receive a copy of this note and/or the CT report. Patient verbalizes understanding. Patient also reports having pneumonia in June but no current symptoms.   IMPRESSION: 1. Nodular consolidation in the posterior right lower lobe. While this may be infectious or inflammatory in etiology, malignancy is also considered. Lung-RADS 4B, suspicious. Additional imaging evaluation or consultation with Pulmonology or Thoracic Surgery recommended. Consider short-term diagnostic CT chest without contrast in 3-4 weeks in further initial evaluation. These results will be called to the ordering clinician or representative by the Radiologist Assistant, and communication documented in the PACS or zVision Dashboard. 2. Possible mild subpleural fibrosis. Please correlate clinically for respiratory symptoms. 3. Aortic atherosclerosis (ICD10-170.0). Coronary artery calcification. 4.  Emphysema (ICD10-J43.9).

## 2018-01-06 ENCOUNTER — Telehealth: Payer: Self-pay | Admitting: Gastroenterology

## 2018-01-06 NOTE — Telephone Encounter (Signed)
Procedure has been cancelled and pt has been notified  

## 2018-01-06 NOTE — Telephone Encounter (Signed)
Patient states due to insurance coverage he can not due procedure scheduled for tomorrow 8.7.19 right now. Patient requesting procedure be canceled.

## 2018-01-07 ENCOUNTER — Encounter: Admission: RE | Payer: Self-pay | Source: Ambulatory Visit

## 2018-01-07 ENCOUNTER — Ambulatory Visit: Admission: RE | Admit: 2018-01-07 | Payer: Medicare Other | Source: Ambulatory Visit | Admitting: Gastroenterology

## 2018-01-07 SURGERY — COLONOSCOPY WITH PROPOFOL
Anesthesia: General

## 2018-01-15 ENCOUNTER — Telehealth: Payer: Self-pay | Admitting: Nurse Practitioner

## 2018-01-16 ENCOUNTER — Telehealth: Payer: Self-pay | Admitting: *Deleted

## 2018-01-16 ENCOUNTER — Other Ambulatory Visit: Payer: Self-pay | Admitting: *Deleted

## 2018-01-16 DIAGNOSIS — Z122 Encounter for screening for malignant neoplasm of respiratory organs: Secondary | ICD-10-CM

## 2018-01-16 NOTE — Telephone Encounter (Signed)
Patient has been notified that the annual lung cancer screening low dose CT scan is due currently or will be in the near future.  Confirmed that the patient is within the age range of 47-80, and asymptomatic, and currently exhibits no signs or symptoms of lung cancer.  Patient denies illness that would prevent curative treatment for lung cancer if found.  Verified smoking history, current, 98 pkyr.  The shared decision making visit was completed on 12-25-17.  Patient is agreeable for the CT scan to be scheduled.  Will call patient back with date and time of appointment.

## 2018-01-16 NOTE — Progress Notes (Signed)
[id number]

## 2018-01-19 ENCOUNTER — Telehealth: Payer: Self-pay | Admitting: *Deleted

## 2018-01-19 NOTE — Telephone Encounter (Signed)
Called patient to inform them of the LDCT screening appt this Friday 01/23/2018 @ 10:15AM here @ [facility], message left to call with question.

## 2018-01-23 ENCOUNTER — Ambulatory Visit: Admission: RE | Admit: 2018-01-23 | Payer: Medicare Other | Source: Ambulatory Visit

## 2018-01-26 ENCOUNTER — Telehealth: Payer: Self-pay | Admitting: *Deleted

## 2018-01-26 NOTE — Telephone Encounter (Signed)
Patient has been notified that the annual lung cancer screening low dose CT scan is due currently or will be in the near future.  Confirmed that the patient is within the age range of 81-80, and asymptomatic, and currently exhibits no signs or symptoms of lung cancer.  Patient denies illness that would prevent curative treatment for lung cancer if found.  Verified smoking history, 98pkyr.  The shared decision making visit was completed on 12-25-17.  Patient is agreeable for the CT scan to be scheduled.  Will call patient back with date and time of appointment.

## 2018-02-03 ENCOUNTER — Telehealth: Payer: Self-pay | Admitting: *Deleted

## 2018-02-03 NOTE — Telephone Encounter (Signed)
Called patient to discuss appt for LDCT for this Thursday 02/05/2018 @ 11:35am here @ [facility].  Voiced understanding.

## 2018-02-05 ENCOUNTER — Ambulatory Visit: Payer: Medicare Other | Attending: Nurse Practitioner

## 2018-02-07 ENCOUNTER — Telehealth: Payer: Self-pay

## 2018-02-07 NOTE — Telephone Encounter (Signed)
Call pt regarding lung screening scan.Per pt is going to try a Hypnosis seminar before getting this scan. He will call us after he try this to see if it works. 02-07-18 at 1:22

## 2018-02-10 ENCOUNTER — Telehealth: Payer: Self-pay | Admitting: *Deleted

## 2018-02-10 NOTE — Telephone Encounter (Signed)
Called patient after no show for LDCT screening, voiced understanding of importance and stated that he would come this time, appt preferences sent to schedulers, informed pt that I would call him back with his appt. Voiced understanding.

## 2018-02-16 ENCOUNTER — Ambulatory Visit: Payer: Medicare Other | Attending: Nurse Practitioner

## 2018-02-25 ENCOUNTER — Ambulatory Visit: Payer: Medicare Other | Attending: Nurse Practitioner

## 2018-03-04 ENCOUNTER — Ambulatory Visit
Admission: RE | Admit: 2018-03-04 | Discharge: 2018-03-04 | Disposition: A | Discharge status: Home / Self Care | Payer: Medicare Other | Source: Ambulatory Visit | Attending: Nurse Practitioner | Admitting: Nurse Practitioner

## 2018-03-04 DIAGNOSIS — Z122 Encounter for screening for malignant neoplasm of respiratory organs: Secondary | ICD-10-CM | POA: Insufficient documentation

## 2018-03-04 DIAGNOSIS — I2510 Atherosclerotic heart disease of native coronary artery without angina pectoris: Secondary | ICD-10-CM | POA: Diagnosis not present

## 2018-03-04 DIAGNOSIS — J439 Emphysema, unspecified: Secondary | ICD-10-CM | POA: Insufficient documentation

## 2018-03-04 DIAGNOSIS — Z87891 Personal history of nicotine dependence: Secondary | ICD-10-CM | POA: Insufficient documentation

## 2018-03-04 DIAGNOSIS — I700 Atherosclerosis of aorta: Secondary | ICD-10-CM | POA: Diagnosis not present

## 2018-03-04 DIAGNOSIS — I2584 Coronary atherosclerosis due to calcified coronary lesion: Secondary | ICD-10-CM | POA: Insufficient documentation

## 2018-03-05 ENCOUNTER — Telehealth: Payer: Self-pay | Admitting: *Deleted

## 2018-03-05 ENCOUNTER — Encounter: Payer: Self-pay | Admitting: *Deleted

## 2018-03-05 ENCOUNTER — Ambulatory Visit: Payer: Medicare Other

## 2018-03-05 NOTE — Telephone Encounter (Signed)
Notified patient of LDCT lung cancer screening program results with recommendation for 12 month follow up imaging.  Also notified of incidental findings noted below and is encouraged to discuss further questions with PCP who will receive a copy of this not and/or the CT reports.  Patient verbalized understanding.    IMPRESSION: 1. Lung-RADS 2, benign appearance or behavior. Continue annual screening with low-dose chest CT without contrast in 12 months. 2. Aortic Atherosclerosis (ICD10-I70.0) and Emphysema (ICD10-J43.9). 3. Multi vessel coronary artery atherosclerotic calcifications including left main disease.

## 2018-03-12 ENCOUNTER — Encounter: Payer: Self-pay | Admitting: *Deleted

## 2018-11-26 ENCOUNTER — Emergency Department: Payer: Medicare Other

## 2018-11-26 ENCOUNTER — Encounter: Payer: Self-pay | Admitting: Emergency Medicine

## 2018-11-26 ENCOUNTER — Other Ambulatory Visit: Payer: Self-pay

## 2018-11-26 ENCOUNTER — Emergency Department
Admission: EM | Admit: 2018-11-26 | Discharge: 2018-11-26 | Disposition: A | Discharge status: Home / Self Care | Payer: Medicare Other | Attending: Emergency Medicine | Admitting: Emergency Medicine

## 2018-11-26 DIAGNOSIS — R531 Weakness: Secondary | ICD-10-CM | POA: Diagnosis not present

## 2018-11-26 DIAGNOSIS — Z79899 Other long term (current) drug therapy: Secondary | ICD-10-CM | POA: Insufficient documentation

## 2018-11-26 DIAGNOSIS — Z20828 Contact with and (suspected) exposure to other viral communicable diseases: Secondary | ICD-10-CM | POA: Diagnosis not present

## 2018-11-26 DIAGNOSIS — J449 Chronic obstructive pulmonary disease, unspecified: Secondary | ICD-10-CM | POA: Diagnosis not present

## 2018-11-26 DIAGNOSIS — F17210 Nicotine dependence, cigarettes, uncomplicated: Secondary | ICD-10-CM | POA: Insufficient documentation

## 2018-11-26 DIAGNOSIS — F1010 Alcohol abuse, uncomplicated: Secondary | ICD-10-CM | POA: Insufficient documentation

## 2018-11-26 DIAGNOSIS — R5383 Other fatigue: Secondary | ICD-10-CM | POA: Diagnosis present

## 2018-11-26 LAB — URINALYSIS, COMPLETE (UACMP) WITH MICROSCOPIC
Bacteria, UA: NONE SEEN
Bilirubin Urine: NEGATIVE
Glucose, UA: NEGATIVE mg/dL
Hgb urine dipstick: NEGATIVE
Ketones, ur: NEGATIVE mg/dL
Leukocytes,Ua: NEGATIVE
Nitrite: NEGATIVE
Protein, ur: NEGATIVE mg/dL
Specific Gravity, Urine: 1.003 — ABNORMAL LOW (ref 1.005–1.030)
Squamous Epithelial / LPF: NONE SEEN (ref 0–5)
pH: 6 (ref 5.0–8.0)

## 2018-11-26 LAB — BASIC METABOLIC PANEL
Anion gap: 12 (ref 5–15)
BUN: 12 mg/dL (ref 8–23)
CO2: 22 mmol/L (ref 22–32)
Calcium: 9 mg/dL (ref 8.9–10.3)
Chloride: 103 mmol/L (ref 98–111)
Creatinine, Ser: 0.8 mg/dL (ref 0.61–1.24)
GFR calc Af Amer: >60 mL/min (ref >60)
GFR calc non Af Amer: >60 mL/min (ref >60)
Glucose, Bld: 91 mg/dL (ref 70–99)
Potassium: 3.6 mmol/L (ref 3.5–5.1)
Sodium: 137 mmol/L (ref 135–145)

## 2018-11-26 LAB — URINE DRUG SCREEN, QUALITATIVE (ARMC ONLY)
Amphetamines, Ur Screen: NOT DETECTED
Barbiturates, Ur Screen: NOT DETECTED
Benzodiazepine, Ur Scrn: NOT DETECTED
Cannabinoid 50 Ng, Ur ~~LOC~~: NOT DETECTED
Cocaine Metabolite,Ur ~~LOC~~: NOT DETECTED
MDMA (Ecstasy)Ur Screen: NOT DETECTED
Methadone Scn, Ur: NOT DETECTED
Opiate, Ur Screen: NOT DETECTED
Phencyclidine (PCP) Ur S: NOT DETECTED
Tricyclic, Ur Screen: NOT DETECTED

## 2018-11-26 LAB — CBC
HCT: 48.9 % (ref 39.0–52.0)
Hemoglobin: 17 g/dL (ref 13.0–17.0)
MCH: 32.5 pg (ref 26.0–34.0)
MCHC: 34.8 g/dL (ref 30.0–36.0)
MCV: 93.5 fL (ref 80.0–100.0)
Platelets: 339 10*3/uL (ref 150–400)
RBC: 5.23 MIL/uL (ref 4.22–5.81)
RDW: 13.1 % (ref 11.5–15.5)
WBC: 12 10*3/uL — ABNORMAL HIGH (ref 4.0–10.5)
nRBC: 0 % (ref 0.0–0.2)

## 2018-11-26 LAB — SARS CORONAVIRUS 2 BY RT PCR (HOSPITAL ORDER, PERFORMED IN ~~LOC~~ HOSPITAL LAB): SARS Coronavirus 2: NEGATIVE

## 2018-11-26 MED ORDER — IOHEXOL 300 MG/ML  SOLN
75.0000 mL | Freq: Once | INTRAMUSCULAR | Status: AC | PRN
  Administered 2018-11-26: 75 mL via INTRAVENOUS

## 2018-11-26 NOTE — ED Notes (Signed)
EDP [place] at bedside.  

## 2018-11-26 NOTE — ED Provider Notes (Signed)
[facility] Emergency Department Provider Note  ____________________________________________   I have reviewed the triage vital signs and the nursing notes. Where available I have reviewed prior notes and, if possible and indicated, outside hospital notes.    HISTORY  Chief Complaint Fatigue  Patient seen and evaluated during the coronavirus epidemic during a time with low staffing  HPI Rodney Howe is a 68 y.o. male with a history of EtOH abuse he quit alcohol about a year ago, history of cocaine abuse who using cocaine about 2010, history of ongoing tobacco abuse, does not have any particular complaint except for he feels more tired than normal since Memorial Day.  He states he has actually started drinking for the first time in a year since [facility] Day, constantly.  States he drinks about 1/5 of vodka every 3 or 4 days.  This is the first time he is really been drinking since he had his pneumonia a year ago.  He states he feels rundown and tired.  Does not have any chest pain shortness breath nausea vomiting fever, or focal problems.  He does states he feels fatigued since he started drinking again.  He does not feel he needs to talk to a counselor or go for rehab.     Past Medical History:  Diagnosis Date  . COPD (chronic obstructive pulmonary disease) [facility])     Patient Active Problem List   Diagnosis Date Noted  . Sepsis ([place]) 11/16/2017    Past Surgical History:  Procedure Laterality Date  . ANKLE SURGERY      Prior to Admission medications   Medication Sig Start Date End Date Taking? Authorizing Provider  albuterol (VENTOLIN HFA) 108 (90 Base) MCG/ACT inhaler Inhale 2 puffs into the lungs every 6 (six) hours as needed for wheezing or shortness of breath. 11/17/17   Henreitta Leber, MD  aspirin EC 81 MG tablet Take 81 mg by mouth daily.    [provider]  atorvastatin (LIPITOR) 40 MG tablet Take 40 mg by mouth every evening.     [provider]  budesonide-formoterol (SYMBICORT) 80-4.5 MCG/ACT inhaler Inhale 2 puffs into the lungs 2 (two) times daily. 11/17/17   Henreitta Leber, MD  gabapentin (NEURONTIN) 300 MG capsule Take 300 mg by mouth 2 (two) times daily.    [provider]  omeprazole (PRILOSEC) 20 MG capsule Take 20 mg by mouth daily.    [provider]  predniSONE (DELTASONE) 10 MG tablet Label  & dispense according to the schedule below. 5 Pills PO for 1 day then, 4 Pills PO for 1 day, 3 Pills PO for 1 day, 2 Pills PO for 1 day, 1 Pill PO for 1 days then STOP. 11/17/17   Henreitta Leber, MD    Allergies Patient has no known allergies.  History reviewed. No pertinent family history.  Social History Social History   Tobacco Use  . Smoking status: Current Every Day Smoker    Packs/day: 2.00    Years: 49.00    Pack years: 98.00    Types: Cigarettes  . Smokeless tobacco: Never Used  Substance Use Topics  . Alcohol use: Yes    Alcohol/week: 36.0 standard drinks    Types: 36 Cans of beer per week    Frequency: Never  . Drug use: Not Currently    Review of Systems Constitutional: No fever/chills Eyes: No visual changes. ENT: No sore throat. No stiff neck no neck pain Cardiovascular: Denies chest pain.  Respiratory: Denies shortness of breath. Gastrointestinal:   no vomiting.  No diarrhea.  No constipation. Genitourinary: Negative for dysuria. Musculoskeletal: Negative lower extremity swelling Skin: Negative for rash. Neurological: Negative for severe headaches, focal weakness or numbness.   ____________________________________________   PHYSICAL EXAM:  VITAL SIGNS: ED Triage Vitals  Enc Vitals Group     BP 11/26/18 1645 105/67     Pulse Rate 11/26/18 1645 74     Resp 11/26/18 1645 18     Temp 11/26/18 1645 98.9 F (37.2 C)     Temp Source 11/26/18 1645 Oral     SpO2 11/26/18 1645 95 %     Weight 11/26/18 1644 150 lb (68 kg)     Height 11/26/18 1644 5\' 6"   (1.676 m)     Head Circumference --      Peak Flow --      Pain Score 11/26/18 1647 0     Pain Loc --      Pain Edu? --      Excl. in [place]? --     Constitutional: Alert and oriented. Well appearing and in no acute distress. Eyes: Conjunctivae are normal Head: Atraumatic HEENT: No congestion/rhinnorhea. Mucous membranes are moist.  Oropharynx non-erythematous Neck:   Nontender with no meningismus, no masses, no stridor Cardiovascular: Normal rate, regular rhythm. Grossly normal heart sounds.  Good peripheral circulation. Respiratory: Normal respiratory effort.  No retractions. Lungs CTAB. Abdominal: Soft and nontender. No distention. No guarding no rebound Back:  There is no focal tenderness or step off.  there is no midline tenderness there are no lesions noted. there is no CVA tenderness  Musculoskeletal: No lower extremity tenderness, no upper extremity tenderness. No joint effusions, no DVT signs strong distal pulses no edema Neurologic:  Normal speech and language. No gross focal neurologic deficits are appreciated.  Skin:  Skin is warm, dry and intact. No rash noted. Psychiatric: Mood and affect are normal. Speech and behavior are normal.  ____________________________________________   LABS (all labs ordered are listed, but only abnormal results are displayed)  Labs Reviewed  CBC - Abnormal; Notable for the following components:      Result Value   WBC 12.0 (*)    All other components within normal limits  URINALYSIS, COMPLETE (UACMP) WITH MICROSCOPIC - Abnormal; Notable for the following components:   Color, Urine STRAW (*)    APPearance CLEAR (*)    Specific Gravity, Urine 1.003 (*)    All other components within normal limits  BASIC METABOLIC PANEL  ETHANOL  URINE DRUG SCREEN, QUALITATIVE ([facility] ONLY)    Pertinent labs  results that were available during my care of the patient were reviewed by me and considered in my medical decision making (see chart for  details). ____________________________________________  EKG  I personally interpreted any EKGs ordered by me or triage Sinus rhythm rate 84 bpm no acute ST elevation depression nonspecific ST changes, ____________________________________________  RADIOLOGY  Pertinent labs & imaging results that were available during my care of the patient were reviewed by me and considered in my medical decision making (see chart for details). If possible, patient and/or family made aware of any abnormal findings.  No results found. ____________________________________________    PROCEDURES  Procedure(s) performed: None  Procedures  Critical Care performed: None  ____________________________________________   INITIAL IMPRESSION / ASSESSMENT AND PLAN / ED COURSE  Pertinent labs & imaging results that were available during my care of the patient were reviewed by me and considered in  my medical decision making (see chart for details).  Patient here with fatigue in the context of renewed alcohol abuse, no obvious injury or pathology noted.  No closed head injury no focal neurologic findings, he is awake alert looks quite well.  He does states his energy levels been lower and coincidentally since he started drinking it seems to be somewhat worse.  We will get a chest x-ray as he does have a history of pneumonia, blood work is otherwise reassuring vital signs are reassuring, I do not think that he would benefit from admission to the hospital at this time we will refer him to outpatient rehab if he decides he wants to stop drinking I have advised him to taper his drinking  nonetheless    ____________________________________________   FINAL CLINICAL IMPRESSION(S) / ED DIAGNOSES  Final diagnoses:  Weakness      This chart was dictated using voice recognition software.  Despite best efforts to proofread,  errors can occur which can change meaning.      Schuyler Amor, MD 11/26/18 2017

## 2018-11-26 NOTE — ED Notes (Signed)
Pt used bedside toilet.

## 2018-11-26 NOTE — Discharge Instructions (Signed)
Is that you slowly taper down your drinking and talk to your doctor about it.  If you have any other concerns including chest pain shortness of breath nausea vomiting weakness weakness in any 1 part of your body, fever or chills you feel worse in any way return to the emergency room.

## 2018-11-26 NOTE — ED Notes (Signed)
Pt updated on plan

## 2018-11-26 NOTE — ED Triage Notes (Signed)
Pt presents to ED c/o malaise and generalized fatigue. Pt states he "feels like I've been on a cocaine binge for days." Denies any drug use. Pt report last time he felt this way he was admitted for pneumonia/sepsis.

## 2018-11-26 NOTE — ED Notes (Signed)
Pt leaving for CT.  

## 2018-11-26 NOTE — ED Notes (Addendum)
Staff at bedside for CXR. Pt reports giving up ETOH but recently starting back again d/t pain.

## 2018-11-26 NOTE — ED Notes (Signed)
Pt back from CT

## 2019-03-08 ENCOUNTER — Telehealth: Payer: Self-pay | Admitting: *Deleted

## 2019-03-08 DIAGNOSIS — Z122 Encounter for screening for malignant neoplasm of respiratory organs: Secondary | ICD-10-CM

## 2019-03-08 DIAGNOSIS — Z87891 Personal history of nicotine dependence: Secondary | ICD-10-CM

## 2019-03-08 NOTE — Telephone Encounter (Signed)
Patient has been notified that annual lung cancer screening low dose CT scan is due currently or will be in near future. Confirmed that patient is within the age range of 55-77, and asymptomatic, (no signs or symptoms of lung cancer). Patient denies illness that would prevent curative treatment for lung cancer if found. Verified smoking history, (current, 100 pack year). The shared decision making visit was done 12/25/17. Patient is agreeable for CT scan being scheduled.

## 2019-03-12 ENCOUNTER — Ambulatory Visit
Admission: RE | Admit: 2019-03-12 | Discharge: 2019-03-12 | Disposition: A | Discharge status: Home / Self Care | Payer: Medicare Other | Source: Ambulatory Visit | Attending: Oncology | Admitting: Oncology

## 2019-03-12 ENCOUNTER — Other Ambulatory Visit: Payer: Self-pay

## 2019-03-12 DIAGNOSIS — Z122 Encounter for screening for malignant neoplasm of respiratory organs: Secondary | ICD-10-CM | POA: Insufficient documentation

## 2019-03-12 DIAGNOSIS — Z87891 Personal history of nicotine dependence: Secondary | ICD-10-CM | POA: Diagnosis present

## 2019-03-16 ENCOUNTER — Encounter: Payer: Self-pay | Admitting: *Deleted

## 2019-05-17 IMAGING — DX DG CHEST 1V PORT
1 series · 1 of 1 positions shown · non-contrast
Comparison: 12/23/2010

CLINICAL DATA: Fatigue, fever, shortness of breath

EXAM:
PORTABLE CHEST 1 VIEW

[chest ap]
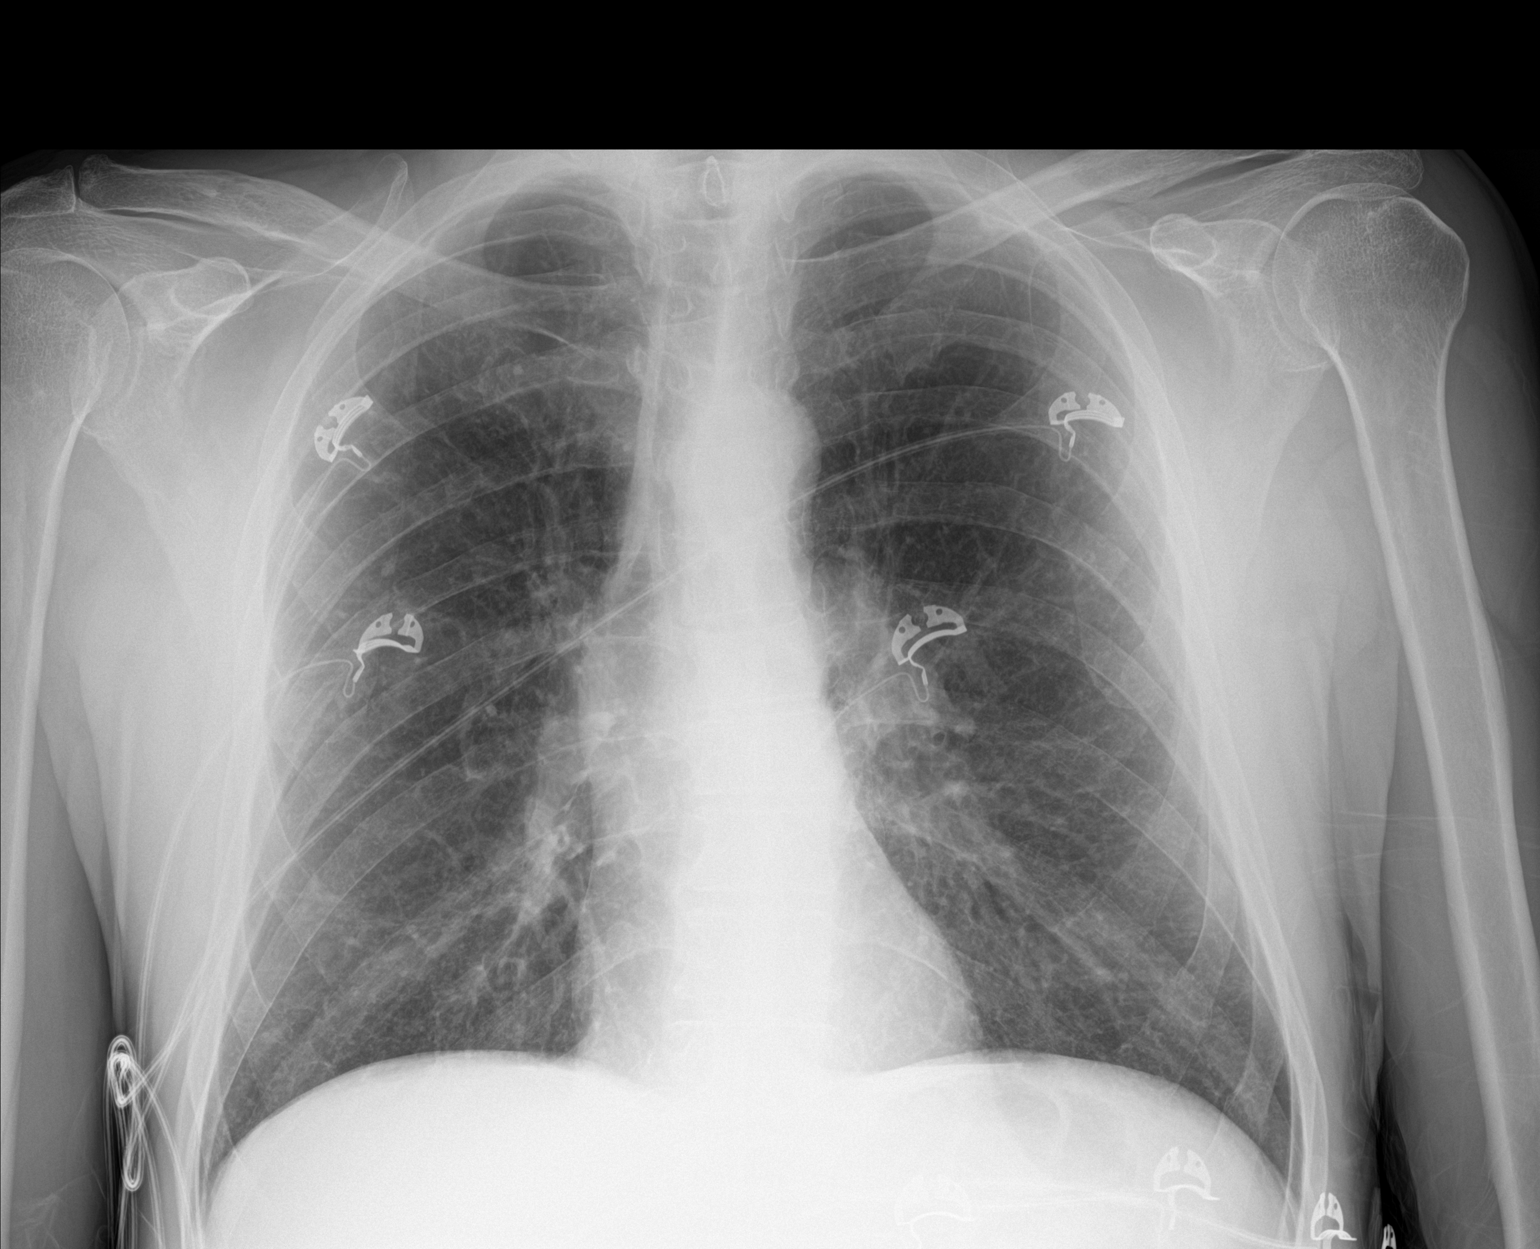

[1 of 1 positions shown; findings below may reference images not displayed]

FINDINGS: Heart and mediastinal contours are within normal limits. No focal
opacities or effusions. No acute bony abnormality.
IMPRESSION: No active disease.

## 2019-06-16 ENCOUNTER — Ambulatory Visit: Payer: Medicare Other | Attending: Internal Medicine

## 2019-06-16 DIAGNOSIS — Z20822 Contact with and (suspected) exposure to covid-19: Secondary | ICD-10-CM

## 2019-06-18 ENCOUNTER — Telehealth: Payer: Self-pay

## 2019-06-18 LAB — NOVEL CORONAVIRUS, NAA: SARS-CoV-2, NAA: NOT DETECTED

## 2019-06-18 NOTE — Telephone Encounter (Signed)
Patient given negative result and verbalized understanding  

## 2019-06-21 ENCOUNTER — Ambulatory Visit: Payer: Medicare Other | Attending: Internal Medicine

## 2019-06-21 DIAGNOSIS — Z20822 Contact with and (suspected) exposure to covid-19: Secondary | ICD-10-CM

## 2019-06-22 LAB — NOVEL CORONAVIRUS, NAA: SARS-CoV-2, NAA: DETECTED — AB

## 2019-06-23 ENCOUNTER — Telehealth: Payer: Self-pay | Admitting: Nurse Practitioner

## 2019-06-23 NOTE — Telephone Encounter (Signed)
Called to Discuss with patient about Covid symptoms and the use of bamlanivimab, a monoclonal antibody infusion for those with mild to moderate Covid symptoms and at a high risk of hospitalization.     Pt is qualified for this infusion at the [facility] infusion center due to co-morbid conditions and/or a member of an at-risk group.     Patient Active Problem List   Diagnosis Date Noted  . Sepsis ([place]) 11/16/2017    Patient denies any symptoms. Symptoms tier reviewed as well as criteria for ending isolation. Preventative practices reviewed. Patient verbalized understanding.     Patient advised to go to Urgent care or ED with severe symptoms.

## 2019-07-01 ENCOUNTER — Ambulatory Visit: Payer: Medicare Other | Attending: Internal Medicine

## 2019-07-01 DIAGNOSIS — Z20822 Contact with and (suspected) exposure to covid-19: Secondary | ICD-10-CM

## 2019-07-02 LAB — NOVEL CORONAVIRUS, NAA: SARS-CoV-2, NAA: DETECTED — AB

## 2019-09-18 ENCOUNTER — Emergency Department: Payer: Medicare Other

## 2019-09-18 ENCOUNTER — Emergency Department
Admission: EM | Admit: 2019-09-18 | Discharge: 2019-09-18 | Disposition: A | Discharge status: Home / Self Care | Payer: Medicare Other | Attending: Emergency Medicine | Admitting: Emergency Medicine

## 2019-09-18 ENCOUNTER — Other Ambulatory Visit: Payer: Self-pay

## 2019-09-18 DIAGNOSIS — J449 Chronic obstructive pulmonary disease, unspecified: Secondary | ICD-10-CM | POA: Insufficient documentation

## 2019-09-18 DIAGNOSIS — Z7982 Long term (current) use of aspirin: Secondary | ICD-10-CM | POA: Insufficient documentation

## 2019-09-18 DIAGNOSIS — Z79899 Other long term (current) drug therapy: Secondary | ICD-10-CM | POA: Diagnosis not present

## 2019-09-18 DIAGNOSIS — R519 Headache, unspecified: Secondary | ICD-10-CM | POA: Diagnosis present

## 2019-09-18 DIAGNOSIS — F17210 Nicotine dependence, cigarettes, uncomplicated: Secondary | ICD-10-CM | POA: Diagnosis not present

## 2019-09-18 LAB — URINALYSIS, COMPLETE (UACMP) WITH MICROSCOPIC
Bacteria, UA: NONE SEEN
Bilirubin Urine: NEGATIVE
Glucose, UA: NEGATIVE mg/dL
Hgb urine dipstick: NEGATIVE
Ketones, ur: NEGATIVE mg/dL
Leukocytes,Ua: NEGATIVE
Nitrite: NEGATIVE
Protein, ur: NEGATIVE mg/dL
Specific Gravity, Urine: 1.008 (ref 1.005–1.030)
Squamous Epithelial / HPF: NONE SEEN (ref 0–5)
pH: 6 (ref 5.0–8.0)

## 2019-09-18 LAB — CBC
HCT: 44.5 % (ref 39.0–52.0)
Hemoglobin: 15.7 g/dL (ref 13.0–17.0)
MCH: 32.7 pg (ref 26.0–34.0)
MCHC: 35.3 g/dL (ref 30.0–36.0)
MCV: 92.7 fL (ref 80.0–100.0)
Platelets: 285 10*3/uL (ref 150–400)
RBC: 4.8 MIL/uL (ref 4.22–5.81)
RDW: 12.5 % (ref 11.5–15.5)
WBC: 7.9 10*3/uL (ref 4.0–10.5)
nRBC: 0 % (ref 0.0–0.2)

## 2019-09-18 LAB — BASIC METABOLIC PANEL
Anion gap: 10 (ref 5–15)
BUN: 9 mg/dL (ref 8–23)
CO2: 24 mmol/L (ref 22–32)
Calcium: 8.5 mg/dL — ABNORMAL LOW (ref 8.9–10.3)
Chloride: 104 mmol/L (ref 98–111)
Creatinine, Ser: 0.88 mg/dL (ref 0.61–1.24)
GFR calc Af Amer: >60 mL/min (ref >60)
GFR calc non Af Amer: >60 mL/min (ref >60)
Glucose, Bld: 98 mg/dL (ref 70–99)
Potassium: 3.7 mmol/L (ref 3.5–5.1)
Sodium: 138 mmol/L (ref 135–145)

## 2019-09-18 LAB — TROPONIN I (HIGH SENSITIVITY)
Troponin I (High Sensitivity): 3 ng/L (ref <18)
Troponin I (High Sensitivity): 3 ng/L (ref <18)

## 2019-09-18 MED ORDER — SODIUM CHLORIDE 0.9% FLUSH: 3.0000 mL | Freq: Once | INTRAVENOUS | Status: DC

## 2019-09-18 NOTE — ED Triage Notes (Signed)
Pt to the er for headache and jaw pain on the right side. Pt had 2 syncopal episodes last weekend following a coughing spell. Pt smokes 2 packs a day for years.

## 2019-09-18 NOTE — ED Notes (Signed)
Pt ambulatory to toilet, provided urine specimen cup.

## 2019-09-18 NOTE — ED Notes (Signed)
E-signature pad in room, not operational.

## 2019-09-18 NOTE — ED Provider Notes (Signed)
[facility] Emergency Department Provider Note  Time seen: 7:15 AM  I have reviewed the triage vital signs and the nursing notes.   HISTORY  Chief Complaint Headache   HPI Rodney Howe is a 69 y.o. male with a past medical history of COPD presents to the emergency department for right-sided headache.  According to the patient for the past 2 days  he has been experiencing a headache on the right side along with some pain in the right jaw.  Patient also states increased coughing and syncopal episodes associated with the coughing including last weekend.  However patient states these episodes have been occurring since he was a child where he will get lightheaded or nearly passed out after a coughing spell.  Patient denies any weakness or numbness of any arm or leg confusion or slurred speech.  Patient describes his headache as mild located on the right side but states he rarely ever has a headache.  Past Medical History:  Diagnosis Date  . COPD (chronic obstructive pulmonary disease) [facility])     Patient Active Problem List   Diagnosis Date Noted  . Sepsis ([place]) 11/16/2017    Past Surgical History:  Procedure Laterality Date  . ANKLE SURGERY      Prior to Admission medications   Medication Sig Start Date End Date Taking? Authorizing Provider  albuterol (VENTOLIN HFA) 108 (90 Base) MCG/ACT inhaler Inhale 2 puffs into the lungs every 6 (six) hours as needed for wheezing or shortness of breath. 11/17/17   Henreitta Leber, MD  aspirin EC 81 MG tablet Take 81 mg by mouth daily.    [provider]  atorvastatin (LIPITOR) 40 MG tablet Take 40 mg by mouth every evening.    [provider]  budesonide-formoterol (SYMBICORT) 80-4.5 MCG/ACT inhaler Inhale 2 puffs into the lungs 2 (two) times daily. 11/17/17   Henreitta Leber, MD  gabapentin (NEURONTIN) 300 MG capsule Take 300 mg by mouth 2 (two) times daily.    [provider]  omeprazole  (PRILOSEC) 20 MG capsule Take 20 mg by mouth daily.    [provider]  predniSONE (DELTASONE) 10 MG tablet Label  & dispense according to the schedule below. 5 Pills PO for 1 day then, 4 Pills PO for 1 day, 3 Pills PO for 1 day, 2 Pills PO for 1 day, 1 Pill PO for 1 days then STOP. 11/17/17   Henreitta Leber, MD    No Known Allergies  No family history on file.  Social History Social History   Tobacco Use  . Smoking status: Current Every Day Smoker    Packs/day: 2.00    Years: 49.00    Pack years: 98.00    Types: Cigarettes  . Smokeless tobacco: Never Used  Substance Use Topics  . Alcohol use: Yes    Alcohol/week: 36.0 standard drinks    Types: 36 Cans of beer per week  . Drug use: Not Currently    Review of Systems Constitutional: Negative for fever. Cardiovascular: Negative for chest pain. Respiratory: Negative for shortness of breath. Gastrointestinal: Negative for abdominal pain Genitourinary: Negative for urinary compaints Musculoskeletal: Negative for musculoskeletal complaints Neurological: Positive for right-sided headache All other ROS negative  ____________________________________________   PHYSICAL EXAM:  VITAL SIGNS: ED Triage Vitals  Enc Vitals Group     BP 09/18/19 0646 133/77     Pulse Rate 09/18/19 0646 81     Resp 09/18/19 0646 20  Temp 09/18/19 0646 98 F (36.7 C)     Temp Source 09/18/19 0646 Oral     SpO2 09/18/19 0646 96 %     Weight 09/18/19 0635 149 lb (67.6 kg)     Height 09/18/19 0635 5\' 6"  (1.676 m)     Head Circumference --      Peak Flow --      Pain Score 09/18/19 0635 10     Pain Loc --      Pain Edu? --      Excl. in [place]? --    Constitutional: Alert and oriented. Well appearing and in no distress. Eyes: Normal exam ENT      Head: Normocephalic and atraumatic.      Mouth/Throat: Mucous membranes are moist. Cardiovascular: Normal rate, regular rhythm. Respiratory: Normal respiratory effort without tachypnea nor  retractions.  Mild rhonchi bilaterally. Gastrointestinal: Soft and nontender. No distention.  Musculoskeletal: Nontender with normal range of motion in all extremities. Neurologic:  Normal speech and language. No gross focal neurologic deficits.  Equal grip strength bilaterally.  No pronator drift. Skin:  Skin is warm, dry and intact.  Psychiatric: Mood and affect are normal.   ____________________________________________    EKG  EKG viewed and interpreted by myself shows a normal sinus rhythm 81 bpm with a narrow QRS, normal axis, largely normal intervals, no concerning ST changes.  ____________________________________________    RADIOLOGY  Patient CT scan head is negative. X-rays negative.  ____________________________________________   INITIAL IMPRESSION / ASSESSMENT AND PLAN / ED COURSE  Pertinent labs & imaging results that were available during my care of the patient were reviewed by me and considered in my medical decision making (see chart for details).   Patient presents to the emergency department for right-sided headache with jaw pain, 2 syncopal episodes last weekend due to coughing spells.  Overall the patient appears well.  States the headache is very mild at this time.  Patient states he recently got Medicare, and is trying to get a PCP but has not yet gotten 1.  Patient states the near syncopal episodes have been occurring for many years since he was a child per patient.  The headache although mild he states is new for him as he does not typically get headaches.  We will check labs, given mild rhonchi on exam we will obtain a chest x-ray.  Given the headache will obtain CT imaging of the head as a precaution.  Patient agreeable plan of care.  Patient's work-up is overall reassuring.  Labs are largely within normal limits.  Imaging is negative for acute abnormality.  Patient states he is feeling much better and is asking to be discharged so he can go to work.  Discussed  return precautions.  We will discharge patient home.  RIELEY JAECKS was evaluated in Emergency Department on 09/18/2019 for the symptoms described in the history of present illness. He was evaluated in the context of the global COVID-19 pandemic, which necessitated consideration that the patient might be at risk for infection with the SARS-CoV-2 virus that causes COVID-19. Institutional protocols and algorithms that pertain to the evaluation of patients at risk for COVID-19 are in a state of rapid change based on information released by regulatory bodies including the CDC and federal and state organizations. These policies and algorithms were followed during the patient's care in the ED.  ____________________________________________   FINAL CLINICAL IMPRESSION(S) / ED DIAGNOSES  Near syncope Headache   Harvest Dark, MD 09/18/19 1052

## 2019-12-16 ENCOUNTER — Other Ambulatory Visit: Payer: Self-pay

## 2019-12-16 ENCOUNTER — Telehealth (INDEPENDENT_AMBULATORY_CARE_PROVIDER_SITE_OTHER): Payer: Self-pay | Admitting: Gastroenterology

## 2019-12-16 DIAGNOSIS — Z1211 Encounter for screening for malignant neoplasm of colon: Secondary | ICD-10-CM

## 2019-12-16 DIAGNOSIS — Z800 Family history of malignant neoplasm of digestive organs: Secondary | ICD-10-CM

## 2019-12-16 MED ORDER — NA SULFATE-K SULFATE-MG SULF 17.5-3.13-1.6 GM/177ML PO SOLN: 1.0000 | Freq: Once | ORAL | 0 refills | Status: AC

## 2019-12-16 NOTE — Progress Notes (Signed)
Gastroenterology Pre-Procedure Review  Request Date: Wed 01/19/20 Requesting Physician: Dr. Bonna Gains  PATIENT REVIEW QUESTIONS: The patient responded to the following health history questions as indicated:    1. Are you having any GI issues? no 2. Do you have a personal history of Polyps? no 3. Do you have a family history of Colon Cancer or Polyps? yes (Dad colon cancer, mother had lower intestinal condition "intestines exploded") 4. Diabetes Mellitus? no 5. Joint replacements in the past 12 months?no 6. Major health problems in the past 3 months?yes (09/18/19 ER Visit for headach. ) 7. Any artificial heart valves, MVP, or defibrillator?no    MEDICATIONS & ALLERGIES:    Patient reports the following regarding taking any anticoagulation/antiplatelet therapy:   Plavix, Coumadin, Eliquis, Xarelto, Lovenox, Pradaxa, Brilinta, or Effient? no Aspirin? yes (81 mg daily)  Patient confirms/reports the following medications:  Current Outpatient Medications  Medication Sig Dispense Refill   albuterol (VENTOLIN HFA) 108 (90 Base) MCG/ACT inhaler Inhale 2 puffs into the lungs every 6 (six) hours as needed for wheezing or shortness of breath. 1 Inhaler 1   amitriptyline (ELAVIL) 25 MG tablet Take by mouth.     aspirin EC 81 MG tablet Take 81 mg by mouth daily.     atorvastatin (LIPITOR) 40 MG tablet Take 40 mg by mouth every evening.     budesonide-formoterol (SYMBICORT) 80-4.5 MCG/ACT inhaler Inhale 2 puffs into the lungs 2 (two) times daily. 1 Inhaler 1   gabapentin (NEURONTIN) 300 MG capsule Take 300 mg by mouth 2 (two) times daily.     omeprazole (PRILOSEC) 20 MG capsule Take 20 mg by mouth daily.     Na Sulfate-K Sulfate-Mg Sulf 17.5-3.13-1.6 GM/177ML SOLN Take 1 kit by mouth once for 1 dose. 354 mL 0   No current facility-administered medications for this visit.    Patient confirms/reports the following allergies:  No Known Allergies  No orders of the defined types were placed  in this encounter.   AUTHORIZATION INFORMATION Primary Insurance: 1D#: Group #:  Secondary Insurance: 1D#: Group #:  SCHEDULE INFORMATION: Date: Wed 01/19/20 Time: Location:[facility]

## 2019-12-17 ENCOUNTER — Other Ambulatory Visit: Payer: Medicare HMO

## 2020-01-17 ENCOUNTER — Other Ambulatory Visit: Admission: RE | Admit: 2020-01-17 | Payer: Medicare HMO | Source: Ambulatory Visit

## 2020-01-19 ENCOUNTER — Encounter: Admission: RE | Payer: Self-pay | Source: Home / Self Care

## 2020-01-19 ENCOUNTER — Ambulatory Visit: Admission: RE | Admit: 2020-01-19 | Payer: Medicare HMO | Source: Home / Self Care | Admitting: Gastroenterology

## 2020-01-19 SURGERY — COLONOSCOPY WITH PROPOFOL
Anesthesia: General

## 2020-01-24 ENCOUNTER — Other Ambulatory Visit: Payer: Medicare HMO

## 2020-03-07 ENCOUNTER — Other Ambulatory Visit: Payer: Self-pay | Admitting: *Deleted

## 2020-03-07 DIAGNOSIS — Z87891 Personal history of nicotine dependence: Secondary | ICD-10-CM

## 2020-03-07 DIAGNOSIS — Z122 Encounter for screening for malignant neoplasm of respiratory organs: Secondary | ICD-10-CM

## 2020-03-07 NOTE — Progress Notes (Signed)
Current smoker, 102 pack year

## 2020-03-13 ENCOUNTER — Ambulatory Visit: Admission: RE | Admit: 2020-03-13 | Payer: Medicare HMO | Source: Ambulatory Visit

## 2020-04-24 ENCOUNTER — Other Ambulatory Visit: Payer: Self-pay

## 2020-04-24 ENCOUNTER — Ambulatory Visit
Admission: RE | Admit: 2020-04-24 | Discharge: 2020-04-24 | Disposition: A | Discharge status: Home / Self Care | Payer: Medicare HMO | Source: Ambulatory Visit | Attending: Oncology | Admitting: Oncology

## 2020-04-24 DIAGNOSIS — Z122 Encounter for screening for malignant neoplasm of respiratory organs: Secondary | ICD-10-CM | POA: Diagnosis present

## 2020-04-24 DIAGNOSIS — Z87891 Personal history of nicotine dependence: Secondary | ICD-10-CM | POA: Insufficient documentation

## 2020-04-25 NOTE — Progress Notes (Signed)
04/26/2020 11:11 AM   Rodney Howe Mar 10, 1951 [id number]  Referring provider: Ranae Plumber, [place],  [place] [address]  No chief complaint on file.   HPI: 69 year old male with severe urinary symptoms and Peyronie's disease reports today for further evaluation.  He reports that over the past year, has been struggling with urinary symptoms.  He gets up 3-4 times a night to urinate which is very bothersome.  He feels like when he does go, he does not empty all the way and his urinary stream is weak.  He tried his friend's Flomax for about 2 weeks and did not see much improvement in his urinary symptoms.  He was also prescribed this by his PCP but never was able to pick it up at the pharmacy (reports that the pharmacy did not have the prescription?).  Prior to your ago, his urinary symptoms are relatively unremarkable.  No dysuria or gross hematuria.  No history of urinary tract infections.  No history of urinary retention.  He is a family history of prostate cancer.  His dad died of prostate cancer in his mid 56s.  He does not know when he was diagnosed and unsure what treatments he received.  He also reports today that he is having issues with penile curvature.  This started occurring about a year ago as well.  He reports that he crush some sort of tablet for penile enhancement and started to have penile curvature shortly thereafter.  Its been stable in angulation and relatively unchanged.  No history of penile trauma or pain with erections.  His curvature is primarily to the right, almost 90 degrees.  He does have some difficulty with penetration secondary to the angle.    He denies any erectile dysfunction and is able to maintain achieve erections as needed.  Extensive smoking history, current smoker.  Most recent PSA 1.7 on 04/11/2020.  On exam today, he was noted to have hydroceles.  He did mention this right but then 1 question, reports that it can be  bothersome at times.  His left side has been swollen for 1 to 2 years and is stable.    IPSS    Row Name 04/26/20 0900         International Prostate Symptom Score   How often have you had the sensation of not emptying your bladder? Almost always     How often have you had to urinate less than every two hours? Almost always     How often have you found you stopped and started again several times when you urinated? More than half the time     How often have you found it difficult to postpone urination? Not at All     How often have you had a weak urinary stream? Almost always     How often have you had to strain to start urination? Less than 1 in 5 times     How many times did you typically get up at night to urinate? 4 Times     Total IPSS Score 24       Quality of Life due to urinary symptoms   If you were to spend the rest of your life with your urinary condition just the way it is now how would you feel about that? Unhappy            Score:  1-7 Mild 8-19 Moderate 20-35 Severe    PMH: Past Medical History:  Diagnosis  Date  . COPD (chronic obstructive pulmonary disease) ([place])     Surgical History: Past Surgical History:  Procedure Laterality Date  . ANKLE SURGERY      Home Medications:  Allergies as of 04/26/2020   No Known Allergies     Medication List       Accurate as of April 26, 2020 11:11 AM. If you have any questions, ask your nurse or doctor.        albuterol 108 (90 Base) MCG/ACT inhaler Commonly known as: Ventolin HFA Inhale 2 puffs into the lungs every 6 (six) hours as needed for wheezing or shortness of breath.   amitriptyline 25 MG tablet Commonly known as: ELAVIL Take by mouth.   aspirin EC 81 MG tablet Take 81 mg by mouth daily.   atorvastatin 40 MG tablet Commonly known as: LIPITOR Take 40 mg by mouth every evening.   budesonide-formoterol 80-4.5 MCG/ACT inhaler Commonly known as: SYMBICORT Inhale 2 puffs into the lungs 2  (two) times daily.   finasteride 5 MG tablet Commonly known as: PROSCAR Take 1 tablet (5 mg total) by mouth daily. Started by: Hollice Espy, MD   gabapentin 300 MG capsule Commonly known as: NEURONTIN Take 300 mg by mouth 2 (two) times daily.   omeprazole 20 MG capsule Commonly known as: PRILOSEC Take 20 mg by mouth daily.   tamsulosin 0.4 MG Caps capsule Commonly known as: Flomax Take 1 capsule (0.4 mg total) by mouth daily. Started by: Hollice Espy, MD       Allergies: No Known Allergies  Family History: No family history on file.  Social History:  reports that he has been smoking cigarettes. He has a 98.00 pack-year smoking history. He has never used smokeless tobacco. He reports current alcohol use of about 36.0 standard drinks of alcohol per week. He reports previous drug use.   Physical Exam: BP 130/86   Pulse 71   Ht 5\' 6"  (1.676 m)   Wt 150 lb (68 kg)   BMI 24.21 kg/m   Constitutional:  Alert and oriented, No acute distress. HEENT: Platter AT, moist mucus membranes.  Trachea midline, no masses. Cardiovascular: No clubbing, cyanosis, or edema. Respiratory: Normal respiratory effort, no increased work of breathing. GI: Abdomen is soft, nontender, nondistended, no abdominal masses GU: Circumcised phallus with curvature to the right at baseline and flaccid today.  There is a palpable penile plaque in the mid right dorsal shaft consistent with Peyronie's disease.  He also has bilateral hydroceles, left greater than right approximately 6 baseball in size on the left.  Left testicle is nonpalpable in the hydrocele sac. Rectal: Normal sphincter tone.  50 cc prostate, nontender no nodules. Neurologic: Grossly intact, no focal deficits, moving all 4 extremities. Psychiatric: Normal mood and affect.  Laboratory Data: Lab Results  Component Value Date   WBC 7.9 09/18/2019   HGB 15.7 09/18/2019   HCT 44.5 09/18/2019   MCV 92.7 09/18/2019   PLT 285 09/18/2019    Lab  Results  Component Value Date   CREATININE 0.88 09/18/2019     Urinalysis UA today is negative  Pertinent Imaging: PVR 113 cc   Assessment & Plan:    1. Benign prostatic hyperplasia with weak urinary stream Severe urinary symptoms, poorly controlled currently on no meds but previously failed Flomax  In the interim, would like him to try Flomax and finasteride for maximal medical therapy.  Based on the size of his prostate exam, SPECT he is underlying severe BPH contributing  primarily to his urinary symptoms.  He also has incomplete bladder emptying which goes along with outlet obstruction.  No evidence of infection is contributing factor.  He mentions today that he has trouble taking pills.  As such, would like him to strongly consider an outlet procedure.  He is agreeable for cystoscopy/TRUS for surgical planning purposes.  All questions answered. - Urinalysis, Complete  2. Peyronie disease Based on patient's history of physical exam, findings are consistent with Peyronie's disease.  Pathophysiology was discussed at length including the 2 phases of the diease, acute and chronic.  Treatment options and goals of treatment were discussed today in detail. Options including observation, penile plaque and graft, penile plication, placement of penile prosthesis, and injection of collagenase were all reviewed.  An benefits of each were discussed at length.  He seems to be most interested in collagenase injections with the medication Xiaflex.  We discussed that this medication is administered in cycles which include 2 injections of the medication into the penile plaque followed by modeling procedure with 6 weeks of home exercises. Goals of treatment were reviewed which include improvement of penile curvature ideally to facilitate sexual function/penetration but likely not complete resolution of the curvature. Risks including failure of the medication, penile hematoma/edema, pain, and penile  fracture were all discussed. All of his questions were answered.  He was given a brochure today.  We will address this after we have addressed #1 in further detail.  3. Encysted hydrocele Bilateral hydroceles, left greater than right.  Moderate bother.  May consider hydrocelectomy if he does consider surgical intervention.   F/u cysto/ TRUS   Hollice Espy, MD  [facility] [address] [place], [place] [address] [phone]  I spent 60 total minutes on the day of the encounter including pre-visit review of the medical record, face-to-face time with the patient, and post visit ordering of labs/imaging/tests.

## 2020-04-26 ENCOUNTER — Ambulatory Visit (INDEPENDENT_AMBULATORY_CARE_PROVIDER_SITE_OTHER): Payer: Medicare HMO | Admitting: Urology

## 2020-04-26 ENCOUNTER — Other Ambulatory Visit: Payer: Self-pay

## 2020-04-26 VITALS — BP 130/86 | HR 71 | Ht 66.0 in | Wt 150.0 lb

## 2020-04-26 DIAGNOSIS — N430 Encysted hydrocele: Secondary | ICD-10-CM | POA: Diagnosis not present

## 2020-04-26 DIAGNOSIS — R3912 Poor urinary stream: Secondary | ICD-10-CM

## 2020-04-26 DIAGNOSIS — N486 Induration penis plastica: Secondary | ICD-10-CM | POA: Diagnosis not present

## 2020-04-26 DIAGNOSIS — N401 Enlarged prostate with lower urinary tract symptoms: Secondary | ICD-10-CM | POA: Diagnosis not present

## 2020-04-26 MED ORDER — FINASTERIDE 5 MG PO TABS: 5.0000 mg | ORAL_TABLET | Freq: Every day | ORAL | 11 refills | Status: DC

## 2020-04-26 MED ORDER — TAMSULOSIN HCL 0.4 MG PO CAPS: 0.4000 mg | ORAL_CAPSULE | Freq: Every day | ORAL | 11 refills | Status: DC

## 2020-04-26 NOTE — Patient Instructions (Signed)
Transrectal Ultrasound Transrectal ultrasound is a procedure that uses sound waves to create images of your prostate gland and nearby tissues. The sound waves are sent through the wall of your rectum into your prostate gland, which is located in front of your rectum. The images show the size and shape of your prostate gland and nearby structures. You may have this test if you have:  Trouble urinating.  Infertility.  An abnormal prostate screening exam. Tell a health care provider about:  Any allergies you have.  All medicines you are taking, including vitamins, herbs, eye drops, creams, and over-the-counter medicines.  Any blood disorders you have.  Any medical conditions you have.  Any surgeries you have had. What are the risks? Generally, this is a safe procedure. However, problems may occur, including:  Discomfort during the procedure. This is rare.  Blood in your urine or sperm after the procedure. This is rare. What happens before the procedure?  Your health care provider may instruct you to use an enema 1-4 hours before the procedure. Follow instructions from your health care provider about how to do the enema.  Ask your health care provider about changing or stopping your regular medicines. This is especially important if you are taking diabetes medicines or blood thinners. What happens during the procedure?  You will be asked to lie down on your left side on an examination table.  You will bend your knees toward your chest.  A lubricated probe will be gently inserted into your rectum. This may cause a feeling of fullness.  The probe will send signals to a computer that will create images.  The technician will slightly rotate the probe throughout the procedure. While rotating the probe, he or she will view and capture images of the prostate gland and the surrounding structures from different angles.  The probe will be removed. The procedure may vary among health care  providers and hospitals. What happens after the procedure?  It is up to you to get the results of your procedure. Ask your health care provider, or the department that is doing the procedure, when your results will be ready. Summary  Transrectal ultrasound is a procedure that uses sound waves to create images of your prostate gland and nearby tissues.  The images show the size and shape of your prostate gland and nearby structures.  Before the procedure, ask your health care provider about changing or stopping your regular medicines. This is especially important if you are taking diabetes medicines or blood thinners. This information is not intended to replace advice given to you by your health care provider. Make sure you discuss any questions you have with your health care provider. Document Revised: 05/02/2017 Document Reviewed: 04/12/2016 Elsevier Patient Education  2020 [employer].   Cystoscopy Cystoscopy is a procedure that is used to help diagnose and sometimes treat conditions that affect the lower urinary tract. The lower urinary tract includes the bladder and the urethra. The urethra is the tube that drains urine from the bladder. Cystoscopy is done using a thin, tube-shaped instrument with a light and camera at the end (cystoscope). The cystoscope may be hard or flexible, depending on the goal of the procedure. The cystoscope is inserted through the urethra, into the bladder. Cystoscopy may be recommended if you have:  Urinary tract infections that keep coming back.  Blood in the urine (hematuria).  An inability to control when you urinate (urinary incontinence) or an overactive bladder.  Unusual cells found in a  urine sample.  A blockage in the urethra, such as a urinary stone.  Painful urination.  An abnormality in the bladder found during an intravenous pyelogram (IVP) or CT scan. Cystoscopy may also be done to remove a sample of tissue to be examined under a  microscope (biopsy). What are the risks? Generally, this is a safe procedure. However, problems may occur, including:  Infection.  Bleeding.  What happens during the procedure?  1. You will be given one or more of the following: ? A medicine to numb the area (local anesthetic). 2. The area around the opening of your urethra will be cleaned. 3. The cystoscope will be passed through your urethra into your bladder. 4. Germ-free (sterile) fluid will flow through the cystoscope to fill your bladder. The fluid will stretch your bladder so that your health care provider can clearly examine your bladder walls. 5. Your doctor will look at the urethra and bladder. 6. The cystoscope will be removed The procedure may vary among health care providers  What can I expect after the procedure? After the procedure, it is common to have: 1. Some soreness or pain in your abdomen and urethra. 2. Urinary symptoms. These include: ? Mild pain or burning when you urinate. Pain should stop within a few minutes after you urinate. This may last for up to 1 week. ? A small amount of blood in your urine for several days. ? Feeling like you need to urinate but producing only a small amount of urine. Follow these instructions at home: General instructions  Return to your normal activities as told by your health care provider.   Do not drive for 24 hours if you were given a sedative during your procedure.  Watch for any blood in your urine. If the amount of blood in your urine increases, call your health care provider.  If a tissue sample was removed for testing (biopsy) during your procedure, it is up to you to get your test results. Ask your health care provider, or the department that is doing the test, when your results will be ready.  Drink enough fluid to keep your urine pale yellow.  Keep all follow-up visits as told by your health care provider. This is important. Contact a health care provider if  you:  Have pain that gets worse or does not get better with medicine, especially pain when you urinate.  Have trouble urinating.  Have more blood in your urine. Get help right away if you:  Have blood clots in your urine.  Have abdominal pain.  Have a fever or chills.  Are unable to urinate. Summary  Cystoscopy is a procedure that is used to help diagnose and sometimes treat conditions that affect the lower urinary tract.  Cystoscopy is done using a thin, tube-shaped instrument with a light and camera at the end.  After the procedure, it is common to have some soreness or pain in your abdomen and urethra.  Watch for any blood in your urine. If the amount of blood in your urine increases, call your health care provider.  If you were prescribed an antibiotic medicine, take it as told by your health care provider. Do not stop taking the antibiotic even if you start to feel better. This information is not intended to replace advice given to you by your health care provider. Make sure you discuss any questions you have with your health care provider. Document Revised: 05/12/2018 Document Reviewed: 05/12/2018 Elsevier Patient Education  2020 Elsevier  Inc.

## 2020-05-01 ENCOUNTER — Encounter: Payer: Self-pay | Admitting: *Deleted

## 2020-05-02 LAB — URINALYSIS, COMPLETE
Bilirubin, UA: NEGATIVE
Glucose, UA: NEGATIVE
Ketones, UA: NEGATIVE
Leukocytes,UA: NEGATIVE
Nitrite, UA: NEGATIVE
Protein,UA: NEGATIVE
RBC, UA: NEGATIVE
Specific Gravity, UA: 1.01 (ref 1.005–1.030)
Urobilinogen, Ur: 0.2 mg/dL (ref 0.2–1.0)
pH, UA: 6 (ref 5.0–7.5)

## 2020-05-02 LAB — MICROSCOPIC EXAMINATION
Bacteria, UA: NONE SEEN
Epithelial Cells (non renal): NONE SEEN /hpf (ref 0–10)

## 2020-05-26 IMAGING — DX PORTABLE CHEST - 1 VIEW
2 series · 2 of 2 positions shown · non-contrast
Comparison: CT 03/04/2018, radiograph 11/24/2017, 11/16/2017,
12/23/2010

CLINICAL DATA: Malaise and fatigue

EXAM:
PORTABLE CHEST 1 VIEW

[chest ap (1 of 2)]
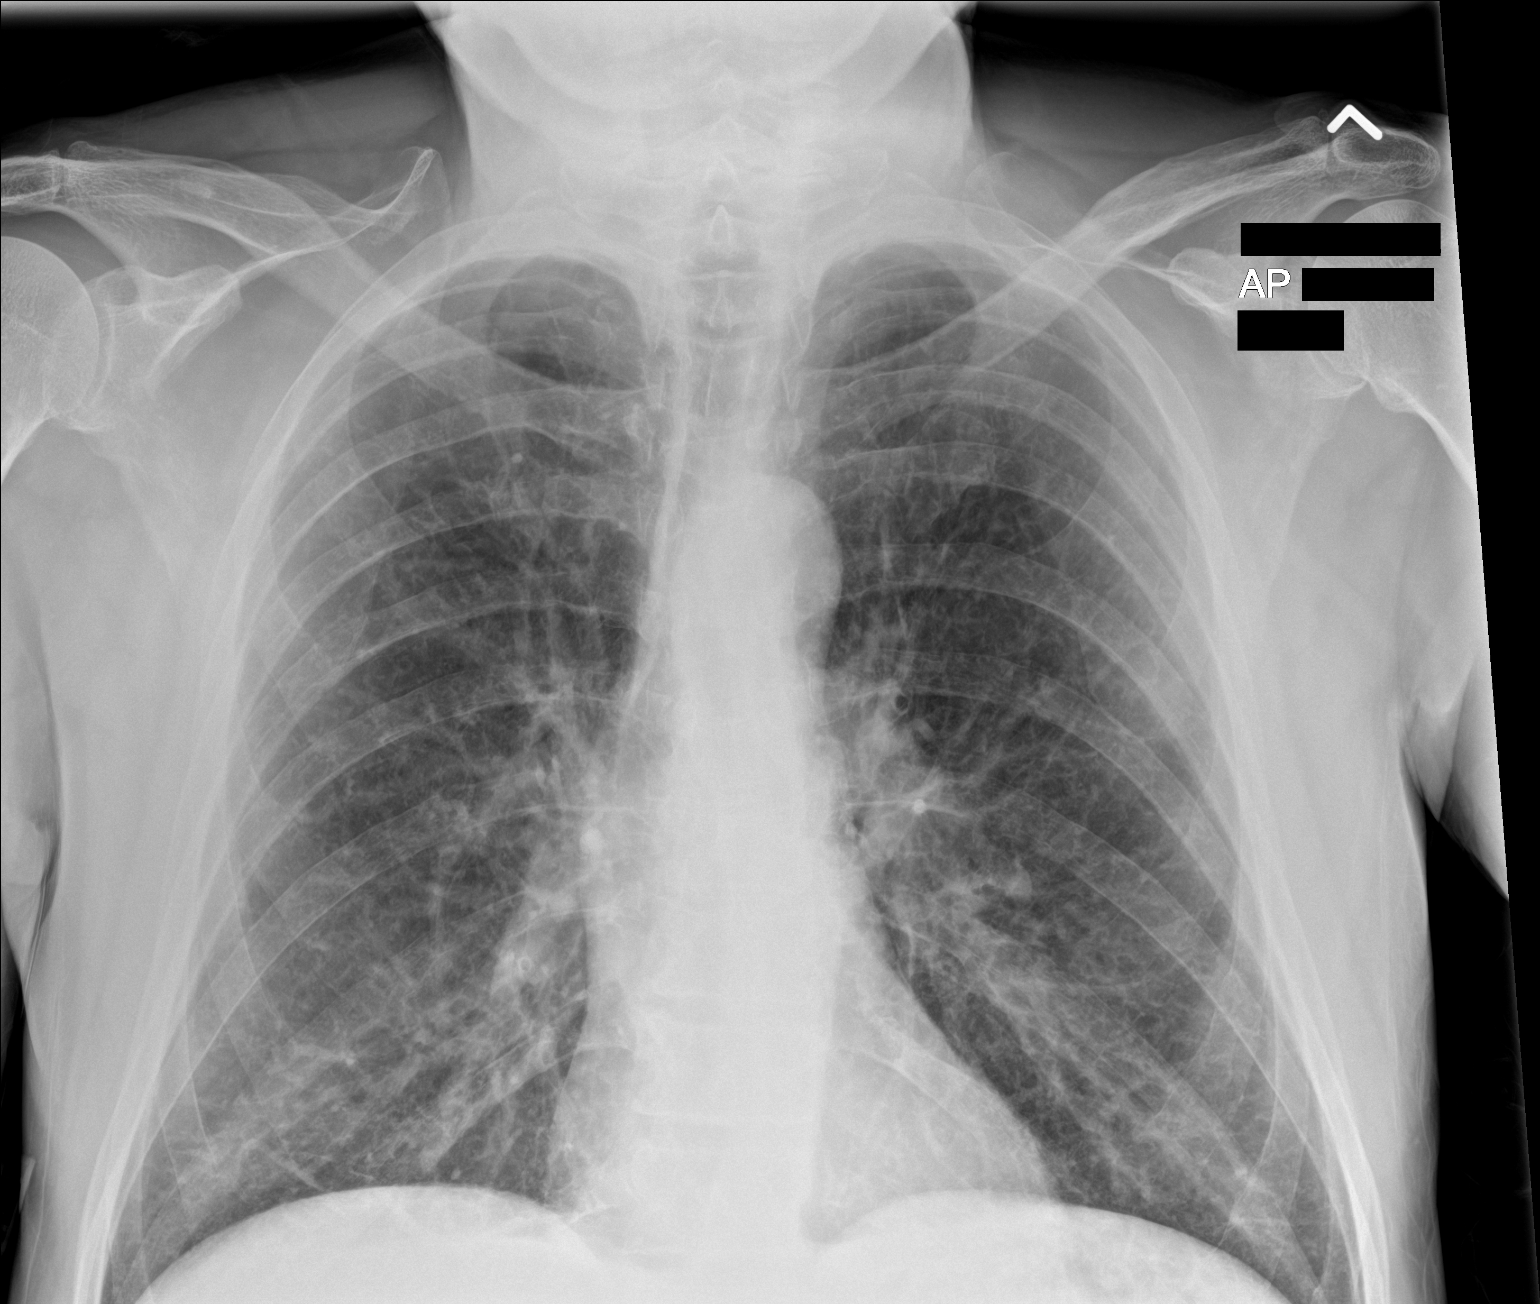

[chest ap (2 of 2)]
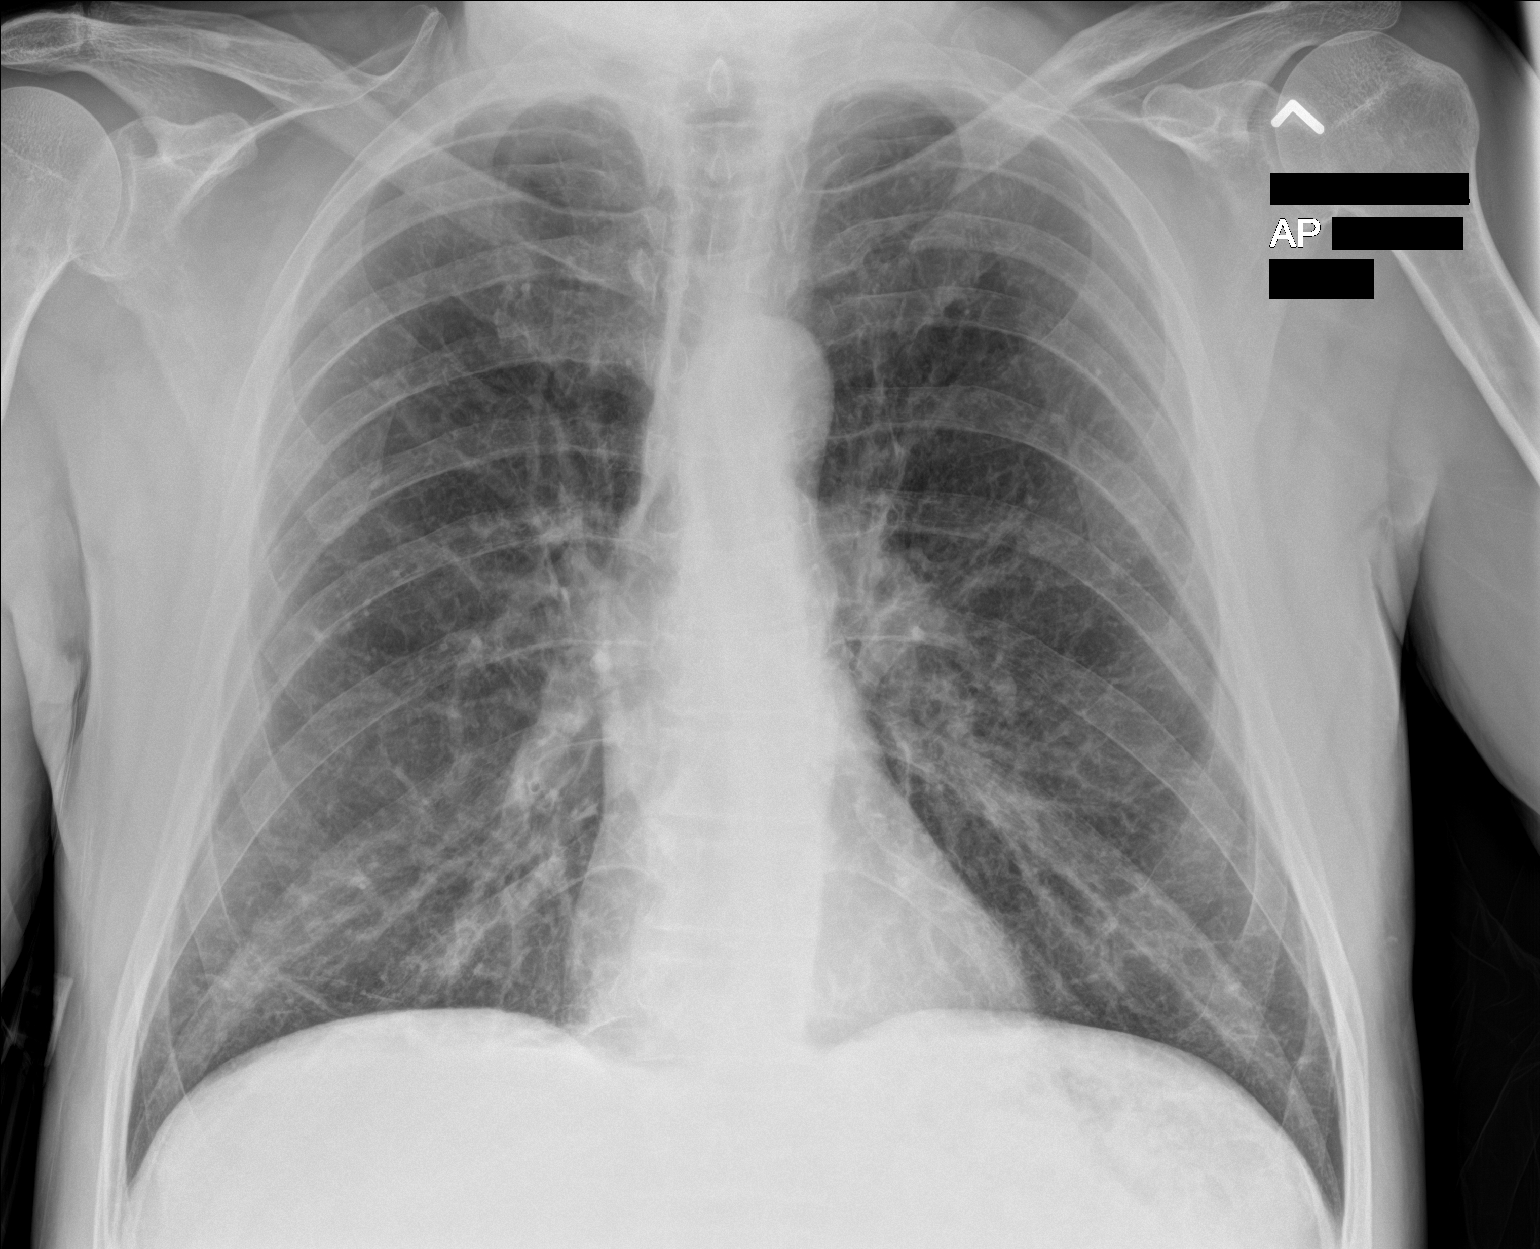

[2 of 2 positions shown; findings below may reference images not displayed]

FINDINGS: Mild diffuse increased interstitial opacity without focal
consolidation or pleural effusion. Stable cardiomediastinal
silhouette. No pneumothorax.
IMPRESSION: Increased interstitial opacity bilaterally, could be secondary to
acute central airways inflammatory process. There is no focal
pneumonia.

## 2020-05-26 IMAGING — CT CT CHEST WITH CONTRAST
2 of 3 series · 15 of 36 positions shown, 18 images · IV contrast (omnipaque)
Comparison: CT chest dated 03/04/2018.

CLINICAL DATA: Abnormal chest x-ray.  Weakness.

EXAM:
CT CHEST WITH CONTRAST
TECHNIQUE: Multidetector CT imaging of the chest was performed during
intravenous contrast administration.
CONTRAST:  75mL OMNIPAQUE IOHEXOL 300 MG/ML  SOLN

[Series 2: axial st · axial · 0.76mm/px · z∈[-33,+251]mm · 12 of 168 slices shown, 15 images]
[im 13/168  mediastinal]
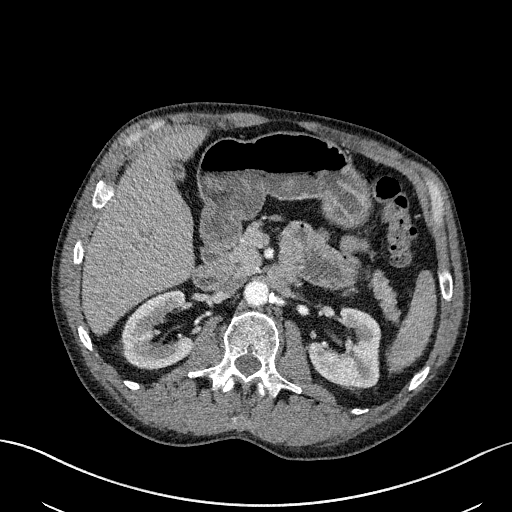
[im 13/168  lung]
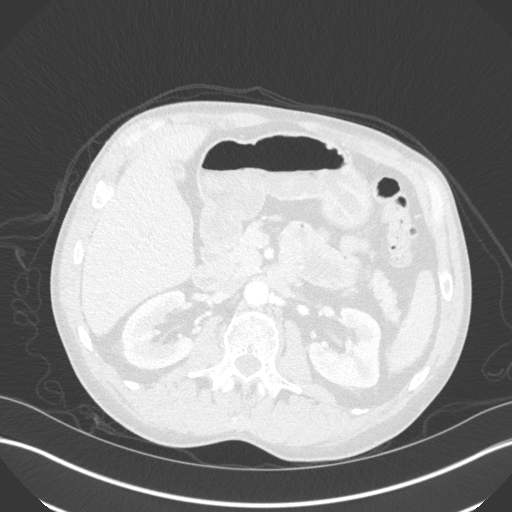
[im 25/168  lung]
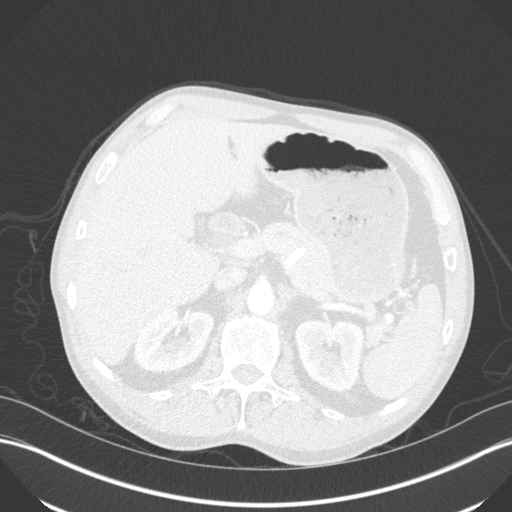
[im 38/168  lung]
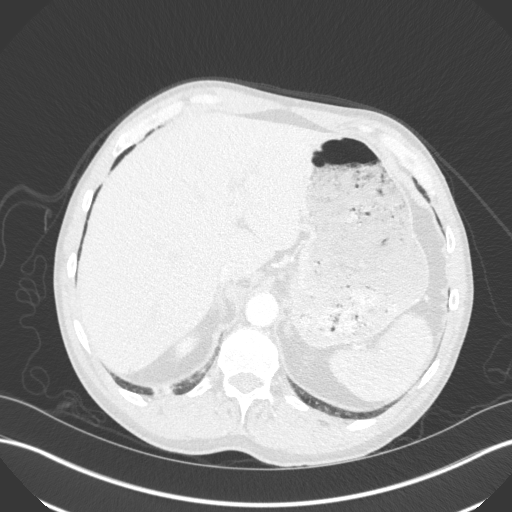
[im 50/168  lung]
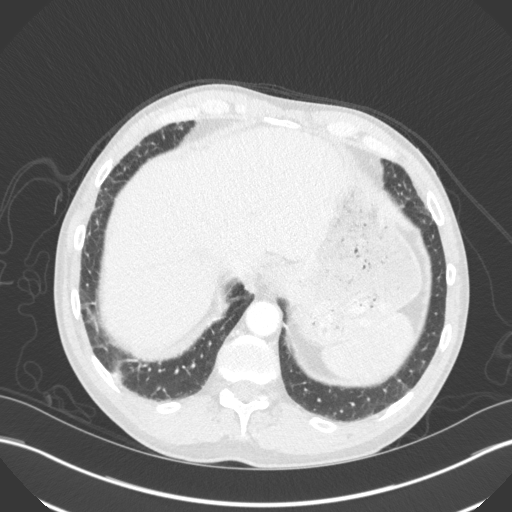
[im 62/168  mediastinal]
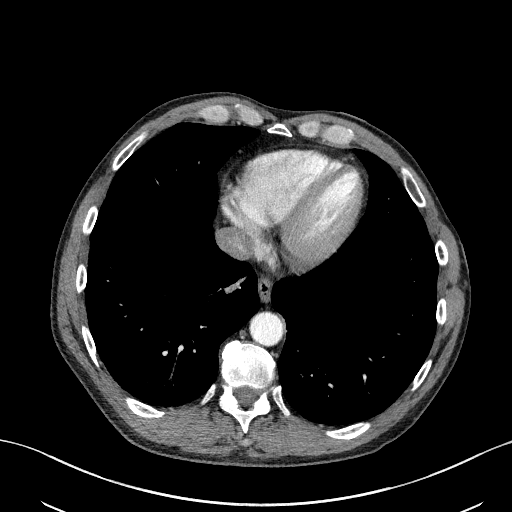
[im 62/168  lung]
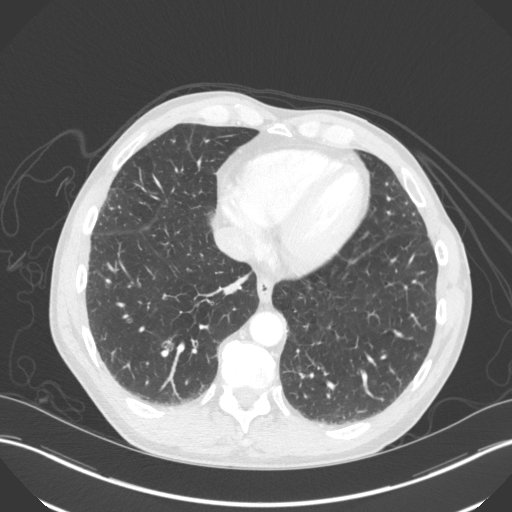
[im 75/168  lung]
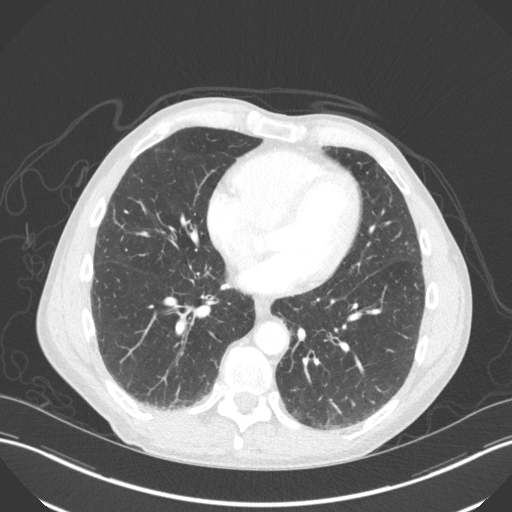
[im 93/168  lung]
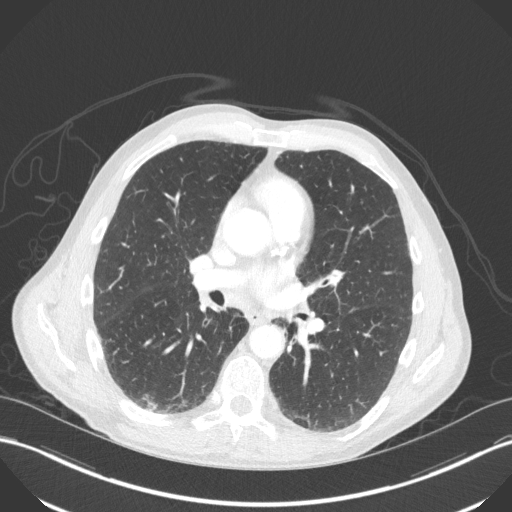
[im 106/168  lung]
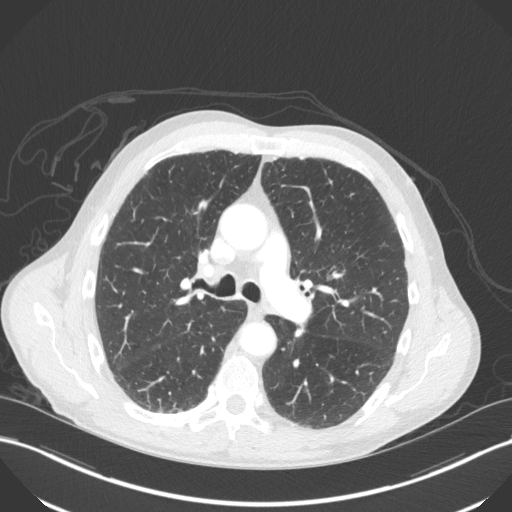
[im 118/168  mediastinal]
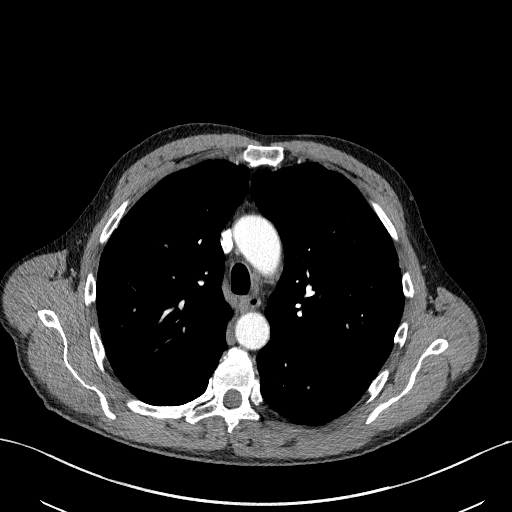
[im 118/168  lung]
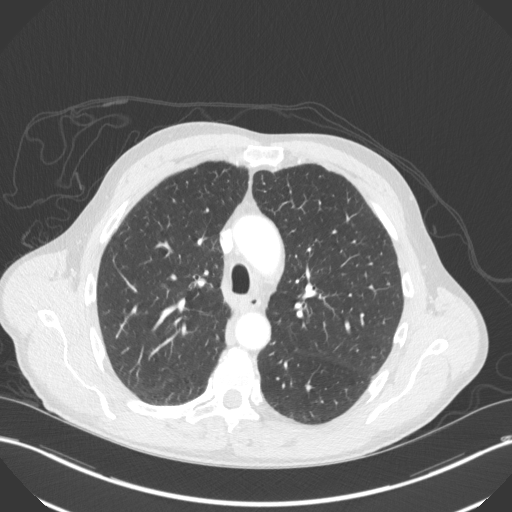
[im 130/168  lung]
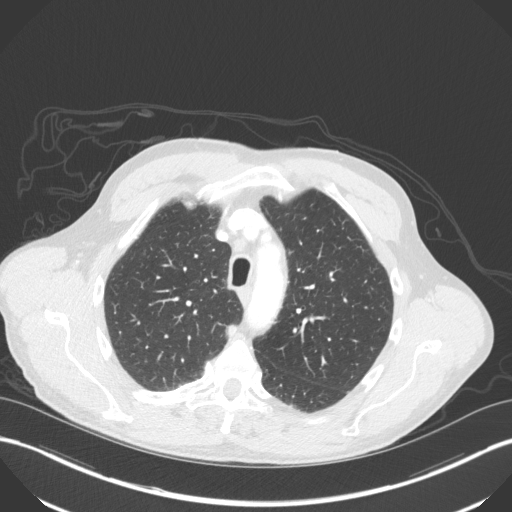
[im 143/168  lung]
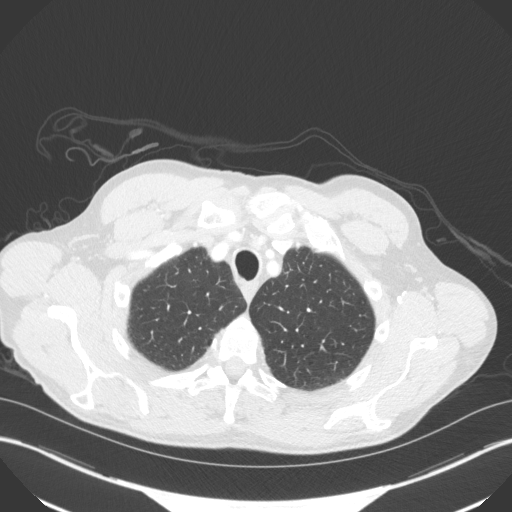
[im 155/168  lung]
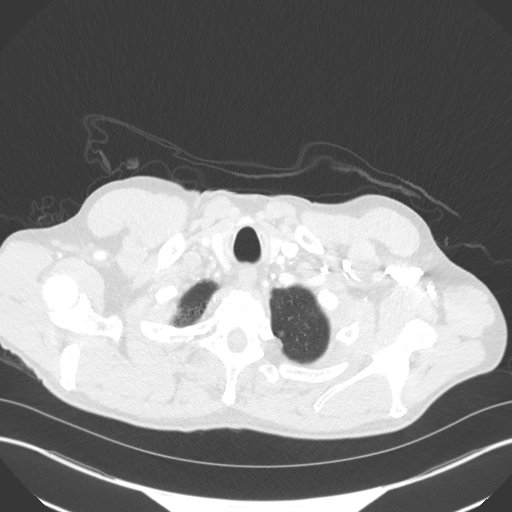

[Series 5: coronal · coronal · 0.65mm/px · 3 of 139 slices shown]
[im 28/139  lung]
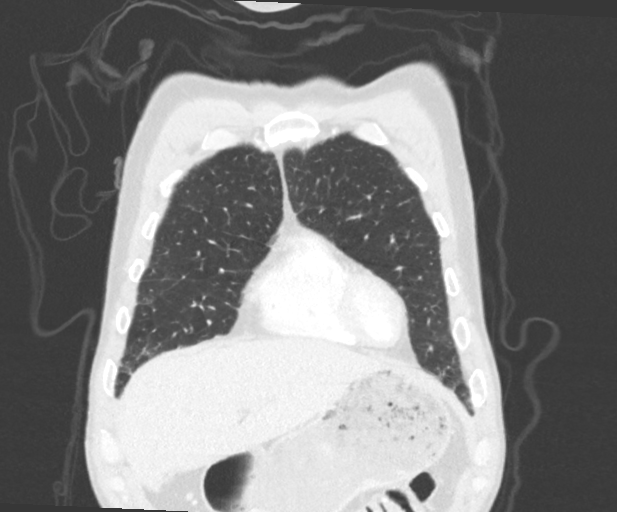
[im 56/139  lung]
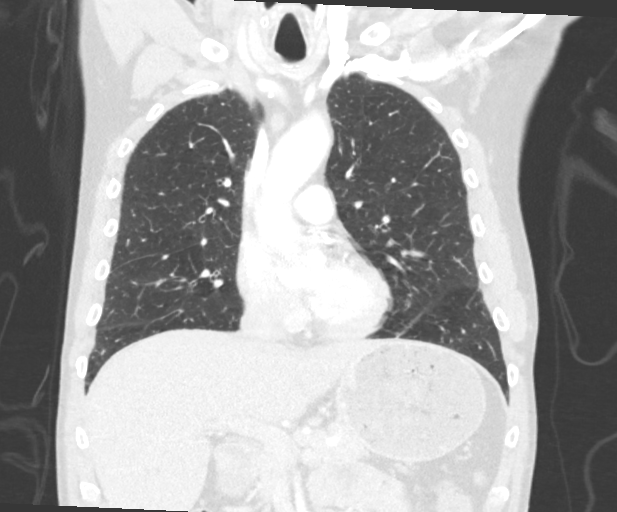
[im 83/139  lung]
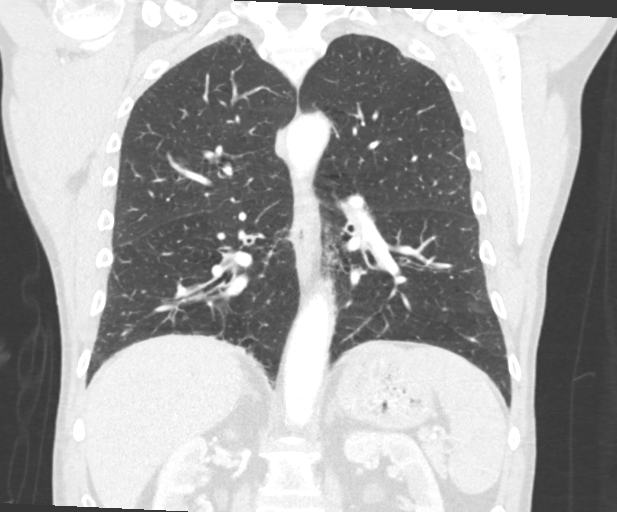

[15 of 36 positions shown; findings below may reference images not displayed]

FINDINGS: Cardiovascular: No significant vascular findings. Normal heart size.
No pericardial effusion. Aortic calcifications are noted. Coronary
artery calcifications are noted. There is no large centrally located
pulmonary embolus.

Mediastinum/Nodes: There are mildly prominent mediastinal and hilar
lymph nodes that appear stable from prior studies. The thyroid gland
is unremarkable. There is no evidence for supraclavicular
adenopathy. There are no pathologically enlarged axillary lymph
nodes. The esophagus is unremarkable.

Lungs/Pleura: The lungs are essentially clear aside from a minimal
amount of atelectasis versus scarring in the medial right lower
lobe. This is new since CT in 6404. There is no pneumothorax. No
large pleural effusion. There is some minimal amount of bronchial
wall thickening at the lung bases bilaterally.

Upper Abdomen: No acute abnormality.

Musculoskeletal: No chest wall abnormality. No acute or significant
osseous findings.
IMPRESSION: 1. No acute abnormality detected. There is a small amount of
atelectasis in the medial right lower lobe, otherwise the lungs are
essentially clear.
2. No large centrally located pulmonary embolus. Detection of
smaller pulmonary emboli is limited by study technique and contrast
timing.

Aortic Atherosclerosis ([id number]).

## 2020-06-14 ENCOUNTER — Other Ambulatory Visit: Payer: Self-pay

## 2020-06-14 ENCOUNTER — Encounter: Payer: Self-pay | Admitting: Urology

## 2020-06-14 ENCOUNTER — Ambulatory Visit (INDEPENDENT_AMBULATORY_CARE_PROVIDER_SITE_OTHER): Payer: Medicare Other | Admitting: Urology

## 2020-06-14 VITALS — BP 136/79 | HR 87

## 2020-06-14 DIAGNOSIS — R3912 Poor urinary stream: Secondary | ICD-10-CM | POA: Diagnosis not present

## 2020-06-14 DIAGNOSIS — N401 Enlarged prostate with lower urinary tract symptoms: Secondary | ICD-10-CM

## 2020-06-14 DIAGNOSIS — N430 Encysted hydrocele: Secondary | ICD-10-CM

## 2020-06-14 DIAGNOSIS — N486 Induration penis plastica: Secondary | ICD-10-CM

## 2020-06-14 NOTE — Progress Notes (Signed)
06/14/20  Chief Complaint  Patient presents with  . Cysto  . TRUS     HPI: 70 year old male with refractory urinary symptoms related to BPH who presents today to the office for cystoscopy and prostate sizing.  He does report that he has had improvement in his urinary symptoms since starting Flomax and he is also on finasteride.   Please see previous notes for details.     There were no vitals taken for this visit. NED. A&Ox3.   No respiratory distress   Abd soft, NT, ND Normal phallus with bilateral descended testicles    Cystoscopy Procedure Note  Patient identification was confirmed, informed consent was obtained, and patient was prepped using Betadine solution.  Lidocaine jelly was administered per urethral meatus.    Pre-Procedure: - Inspection reveals a normal caliber ureteral meatus.  Procedure: The flexible cystoscope was introduced without difficulty - No urethral strictures/lesions are present. - Enlarged prostate bilobar coaptation with a relatively short prostatic length - Normal bladder neck - Bilateral ureteral orifices identified - Bladder mucosa  reveals no ulcers, tumors, or lesions - No bladder stones - No trabeculation  Retroflexion shows unremarkable, no discrete median lobe   Post-Procedure: - Patient tolerated the procedure well   Prostate transrectal ultrasound sizing   Informed consent was obtained after discussing risks/benefits of the procedure.  A time out was performed to ensure correct patient identity.   Pre-Procedure: -Transrectal probe was placed without difficulty -Transrectal Ultrasound performed revealing a 25.05 gm prostate measuring 2.50 x 4.28 x 4.47 cm (length) -No significant hypoechoic or median lobe noted      Assessment/ Plan:   1. Benign prostatic hyperplasia with weak urinary stream Currently on maximal medical therapy on Flomax and finasteride with improvement in his urinary symptoms  He is a good candidate  for an outlet procedure including UroLift.  He was given literature on this today.  Benefits of pursuing such a procedure including the ability to stop his BPH medications were reviewed.  Literature given today.  Will have him return in 6 weeks to discuss further.  He will think about it.  - Urinalysis, Complete  2. Peyronie disease May be interested in [place], will discuss further at next follow-up  3. Encysted hydrocele Bilateral hydroceles, moderately symptomatic  Return in 6 weeks discussed treatment options for the above   Hollice Espy, MD

## 2020-06-15 LAB — URINALYSIS, COMPLETE
Bilirubin, UA: NEGATIVE
Glucose, UA: NEGATIVE
Ketones, UA: NEGATIVE
Leukocytes,UA: NEGATIVE
Nitrite, UA: NEGATIVE
Protein,UA: NEGATIVE
RBC, UA: NEGATIVE
Specific Gravity, UA: 1.02 (ref 1.005–1.030)
Urobilinogen, Ur: 0.2 mg/dL (ref 0.2–1.0)
pH, UA: 7 (ref 5.0–7.5)

## 2020-06-15 LAB — MICROSCOPIC EXAMINATION: Bacteria, UA: NONE SEEN

## 2020-07-12 ENCOUNTER — Other Ambulatory Visit: Payer: Self-pay

## 2020-07-12 ENCOUNTER — Emergency Department
Admission: EM | Admit: 2020-07-12 | Discharge: 2020-07-12 | Disposition: A | Discharge status: Home / Self Care | Payer: Medicare Other | Attending: Emergency Medicine | Admitting: Emergency Medicine

## 2020-07-12 ENCOUNTER — Emergency Department: Payer: Medicare Other

## 2020-07-12 ENCOUNTER — Encounter: Payer: Self-pay | Admitting: Emergency Medicine

## 2020-07-12 DIAGNOSIS — F17210 Nicotine dependence, cigarettes, uncomplicated: Secondary | ICD-10-CM | POA: Insufficient documentation

## 2020-07-12 DIAGNOSIS — Z79899 Other long term (current) drug therapy: Secondary | ICD-10-CM | POA: Diagnosis not present

## 2020-07-12 DIAGNOSIS — I10 Essential (primary) hypertension: Secondary | ICD-10-CM | POA: Insufficient documentation

## 2020-07-12 DIAGNOSIS — J449 Chronic obstructive pulmonary disease, unspecified: Secondary | ICD-10-CM | POA: Insufficient documentation

## 2020-07-12 DIAGNOSIS — R4182 Altered mental status, unspecified: Secondary | ICD-10-CM | POA: Diagnosis not present

## 2020-07-12 DIAGNOSIS — W010XXA Fall on same level from slipping, tripping and stumbling without subsequent striking against object, initial encounter: Secondary | ICD-10-CM | POA: Diagnosis not present

## 2020-07-12 DIAGNOSIS — Z7982 Long term (current) use of aspirin: Secondary | ICD-10-CM | POA: Insufficient documentation

## 2020-07-12 DIAGNOSIS — S0993XA Unspecified injury of face, initial encounter: Secondary | ICD-10-CM | POA: Diagnosis present

## 2020-07-12 DIAGNOSIS — K219 Gastro-esophageal reflux disease without esophagitis: Secondary | ICD-10-CM | POA: Insufficient documentation

## 2020-07-12 DIAGNOSIS — S01511A Laceration without foreign body of lip, initial encounter: Secondary | ICD-10-CM | POA: Diagnosis not present

## 2020-07-12 MED ORDER — CEPHALEXIN 500 MG PO CAPS
500.0000 mg | ORAL_CAPSULE | Freq: Once | ORAL | Status: AC
  Administered 2020-07-12: 500 mg via ORAL
  Filled 2020-07-12: qty 1

## 2020-07-12 MED ORDER — LIDOCAINE-EPINEPHRINE 2 %-1:100000 IJ SOLN
20.0000 mL | Freq: Once | INTRAMUSCULAR | Status: DC
  Filled 2020-07-12: qty 1

## 2020-07-12 MED ORDER — CEPHALEXIN 500 MG PO CAPS: 500.0000 mg | ORAL_CAPSULE | Freq: Four times a day (QID) | ORAL | 0 refills | Status: AC

## 2020-07-12 NOTE — ED Triage Notes (Signed)
Pt to ED from home c/o lower lip laceration tonight.  States started coughing while smoking a cigarette and got light headed and fell forward and bit lip.  Large chunk of lower lip missing, in cup on ice brought in by patient.  Bleeding controlled at this time.  Pt had 2 drinks of moonshine tonight.

## 2020-07-12 NOTE — ED Provider Notes (Signed)
[facility] Emergency Department Provider Note ____________________________________________   Event Date/Time   First MD Initiated Contact with Patient 07/12/20 2210     (approximate)  I have reviewed the triage vital signs and the nursing notes.  HISTORY  Chief Complaint Lip Laceration   HPI Rodney Howe is a 70 y.o. malewho presents to the ED for evaluation of lip laceration.   Chart review indicates hx COPD, GERD, HTN.  Patient reports having a couple drinks of moonshine tonight, with stepping out to his back porch to light a cigarette, when he tripped and fell.  Patient reports a lip laceration.  Denies head trauma or syncope, denies additional trauma beyond his lower lip. Presents to the ED with a chunk of his lower lip in a solo cup with ice, requesting stitches or antibiotics.   Past Medical History:  Diagnosis Date  . COPD (chronic obstructive pulmonary disease) [facility])     Patient Active Problem List   Diagnosis Date Noted  . Sepsis ([place]) 11/16/2017  . Hyperlipidemia 06/24/2013  . FHx: colon cancer 05/19/2013  . Heartburn 05/19/2013  . Tobacco abuse 05/19/2013    Past Surgical History:  Procedure Laterality Date  . ANKLE SURGERY      Prior to Admission medications   Medication Sig Start Date End Date Taking? Authorizing Provider  cephALEXin (KEFLEX) 500 MG capsule Take 1 capsule (500 mg total) by mouth 4 (four) times daily for 10 days. 07/12/20 07/22/20 Yes Vladimir Crofts, MD  albuterol (VENTOLIN HFA) 108 (90 Base) MCG/ACT inhaler Inhale 2 puffs into the lungs every 6 (six) hours as needed for wheezing or shortness of breath. 11/17/17   Henreitta Leber, MD  amitriptyline (ELAVIL) 25 MG tablet Take by mouth. 02/05/12   [provider]  aspirin EC 81 MG tablet Take 81 mg by mouth daily.    [provider]  atorvastatin (LIPITOR) 40 MG tablet Take 40 mg by mouth every evening.    [provider]   budesonide-formoterol (SYMBICORT) 80-4.5 MCG/ACT inhaler Inhale 2 puffs into the lungs 2 (two) times daily. 11/17/17   Henreitta Leber, MD  finasteride (PROSCAR) 5 MG tablet Take 1 tablet (5 mg total) by mouth daily. 04/26/20   Hollice Espy, MD  gabapentin (NEURONTIN) 300 MG capsule Take 300 mg by mouth 2 (two) times daily.    [provider]  omeprazole (PRILOSEC) 20 MG capsule Take 20 mg by mouth daily.    [provider]  tamsulosin (FLOMAX) 0.4 MG CAPS capsule Take 1 capsule (0.4 mg total) by mouth daily. 04/26/20   Hollice Espy, MD    Allergies Patient has no known allergies.  History reviewed. No pertinent family history.  Social History Social History   Tobacco Use  . Smoking status: Current Every Day Smoker    Packs/day: 2.00    Years: 49.00    Pack years: 98.00    Types: Cigarettes  . Smokeless tobacco: Never Used  Substance Use Topics  . Alcohol use: Yes    Alcohol/week: 36.0 standard drinks    Types: 36 Cans of beer per week  . Drug use: Not Currently    Review of Systems  Constitutional: No fever/chills Eyes: No visual changes. ENT: No sore throat.  Fall with lip laceration. Cardiovascular: Denies chest pain. Respiratory: Denies shortness of breath. Gastrointestinal: No abdominal pain.  No nausea, no vomiting.  No diarrhea.  No constipation. Genitourinary: Negative for dysuria. Musculoskeletal: Negative for back pain. Skin: Negative for  rash. Neurological: Negative for headaches, focal weakness or numbness.  ____________________________________________   PHYSICAL EXAM:  VITAL SIGNS: Vitals:   07/12/20 2154 07/12/20 2230  BP: (!) 141/117 (!) 167/116  Pulse: 87 99  Resp: 18   SpO2: 96% 95%     Constitutional: Alert and oriented.  Clinically intoxicated.  No distress and looks clinically well. Eyes: Conjunctivae are normal. PERRL. EOMI. Head: Obvious lower lip laceration, pictured below.  Slowly oozing, hemostatic with  direct pressure.  No loose teeth or intraoral trauma.  Teeth are well aligned.  No mandibular bony tenderness, step-offs.  No nasal septal hematoma.  No bony step-offs to the scalp or signs of scalp laceration. Nose: No congestion/rhinnorhea. Mouth/Throat: Mucous membranes are moist.  Oropharynx non-erythematous. Neck: No stridor. No cervical spine tenderness to palpation. Cardiovascular: Normal rate, regular rhythm. Grossly normal heart sounds.  Good peripheral circulation. Respiratory: Normal respiratory effort.  No retractions. Lungs CTAB. Gastrointestinal: Soft , nondistended, nontender to palpation. No CVA tenderness. Musculoskeletal: No lower extremity tenderness nor edema.  No joint effusions. No signs of acute trauma. Neurologic:  Normal speech and language. No gross focal neurologic deficits are appreciated. No gait instability noted. Skin:  Skin is warm, dry and intact. No rash noted. Psychiatric: Mood and affect are normal. Speech and behavior are normal.     ____________________________________________  RADIOLOGY  ED MD interpretation: CT head reviewed by me without evidence of acute intracranial pathology.  Official radiology report(s): CT Head Wo Contrast  Result Date: 07/12/2020 CLINICAL DATA:  Mental status changes of unknown etiology. Syncopal or near syncopal episode. EXAM: CT HEAD WITHOUT CONTRAST TECHNIQUE: Contiguous axial images were obtained from the base of the skull through the vertex without intravenous contrast. COMPARISON:  09/18/2019 FINDINGS: Brain: The brain shows a normal appearance without evidence of malformation, atrophy, old or acute small or large vessel infarction, mass lesion, hemorrhage, hydrocephalus or extra-axial collection. Vascular: There is atherosclerotic calcification of the major vessels at the base of the brain. Skull: Normal.  No traumatic finding.  No focal bone lesion. Sinuses/Orbits: Sinuses are clear. Orbits appear normal. Mastoids are  clear. Other: None significant IMPRESSION: Normal head CT for age. Electronically Signed   By: Nelson Chimes M.D.   On: 07/12/2020 22:41    ____________________________________________   PROCEDURES and INTERVENTIONS  Procedure(s) performed (including Critical Care):  Marland KitchenMarland KitchenLaceration Repair  Date/Time: 07/12/2020 11:14 PM Performed by: Vladimir Crofts, MD Authorized by: Vladimir Crofts, MD   Consent:    Consent obtained:  Verbal   Consent given by:  Patient   Risks, benefits, and alternatives were discussed: yes     Risks discussed:  Infection, pain, poor cosmetic result, poor wound healing, need for additional repair and retained foreign body   Alternatives discussed:  No treatment Laceration details:    Location:  Lip   Lip location:  Lower lip, full thickness   Vermilion border involved: yes     Height of lip laceration:  More than half vertical height Exploration:    Hemostasis achieved with:  Direct pressure   Imaging outcome: foreign body not noted     Contaminated: no   Treatment:    Area cleansed with:  Povidone-iodine   Amount of cleaning:  Standard   Irrigation solution:  Sterile saline   Irrigation method:  Pressure wash   Visualized foreign bodies/material removed: no     Debridement:  Minimal   Layers/structures repaired:  Deep subcutaneous Deep subcutaneous:    Suture size:  4-0  Suture material:  Monocryl   Suture technique:  Simple interrupted   Number of sutures:  1 Approximation:    Approximation:  Loose   Vermilion border well-aligned: no   Repair type:    Repair type:  Simple Post-procedure details:    Dressing:  Open (no dressing)   Procedure completion:  Tolerated well, no immediate complications    Medications  lidocaine-EPINEPHrine (XYLOCAINE W/EPI) 2 %-1:100000 (with pres) injection 20 mL (20 mLs Intradermal Not Given 07/12/20 2309)  cephALEXin (KEFLEX) capsule 500 mg (500 mg Oral Given 07/12/20 2307)     ____________________________________________   MDM / ED COURSE   70 year old male presents to the ED with an isolated lip laceration, requiring close plastics follow-up for repair.  Hemodynamically stable.  Exam with isolated mandibular lip laceration, pictured above.  Extensive tissue loss makes primary repair not possible.  The left-sided portion of this laceration goes deep/inferiorly, that I place a deep absorbable suture to reapproximate this tissue, but there is some of tissue loss then unable to perform a primary repair.  CT head without evidence of bony fracture, ICH or signs of CVA.  Started patient on a course of Keflex for infection prophylaxis.  Provided follow-up information for plastic surgery.  Discussed the case with patient and his son-in-law about the importance of following up close with plastic surgery in the next 12 hours, urging him to call them in the morning.  We discussed return precautions for the ED.   Clinical Course as of 07/12/20 2322  Wed Jul 12, 2020  2321 Son-in-law here to pick up the patient.  I again discussed return precautions, outpatient management and following up with plastic surgery in the morning. [DS]    Clinical Course User Index [DS] Vladimir Crofts, MD    ____________________________________________   FINAL CLINICAL IMPRESSION(S) / ED DIAGNOSES  Final diagnoses:  Lip laceration, initial encounter     ED Discharge Orders         Ordered    cephALEXin (KEFLEX) 500 MG capsule  4 times daily        07/12/20 2310           Jazmyn Offner   Note:  This document was prepared using Systems analyst and may include unintentional dictation errors.   Vladimir Crofts, MD 07/12/20 [phone]

## 2020-07-12 NOTE — Discharge Instructions (Addendum)
In the morning, go pick up your prescription for antibiotics to take 4 times daily for the next 10 days.  In the morning, call the attached number for the plastic surgeons in [place]  Return to the ED with any fevers or pus coming from the mouth

## 2020-07-12 NOTE — ED Notes (Signed)
Could not get pt to sign out. Pt was ready to go.

## 2020-07-26 ENCOUNTER — Ambulatory Visit: Payer: Self-pay | Admitting: Urology

## 2020-08-02 NOTE — Progress Notes (Incomplete)
   11/02/19 5:42 PM   Rodney Howe Sep 08, 1950 [id number]  Referring provider: Center, [facility] [place],  [place] [address] No chief complaint on file.   HPI: Rodney Howe is a 70 y.o.     PMH: Past Medical History:  Diagnosis Date  . COPD (chronic obstructive pulmonary disease) ([place])     Surgical History: Past Surgical History:  Procedure Laterality Date  . ANKLE SURGERY      Home Medications:  Allergies as of 08/03/2020   No Known Allergies     Medication List       Accurate as of August 02, 2020  5:42 PM. If you have any questions, ask your nurse or doctor.        albuterol 108 (90 Base) MCG/ACT inhaler Commonly known as: Ventolin HFA Inhale 2 puffs into the lungs every 6 (six) hours as needed for wheezing or shortness of breath.   amitriptyline 25 MG tablet Commonly known as: ELAVIL Take by mouth.   aspirin EC 81 MG tablet Take 81 mg by mouth daily.   atorvastatin 40 MG tablet Commonly known as: LIPITOR Take 40 mg by mouth every evening.   budesonide-formoterol 80-4.5 MCG/ACT inhaler Commonly known as: SYMBICORT Inhale 2 puffs into the lungs 2 (two) times daily.   finasteride 5 MG tablet Commonly known as: PROSCAR Take 1 tablet (5 mg total) by mouth daily.   gabapentin 300 MG capsule Commonly known as: NEURONTIN Take 300 mg by mouth 2 (two) times daily.   omeprazole 20 MG capsule Commonly known as: PRILOSEC Take 20 mg by mouth daily.   tamsulosin 0.4 MG Caps capsule Commonly known as: Flomax Take 1 capsule (0.4 mg total) by mouth daily.       Allergies: No Known Allergies  Family History: No family history on file.  Social History:  reports that he has been smoking cigarettes. He has a 98.00 pack-year smoking history. He has never used smokeless tobacco. He reports current alcohol use of about 36.0 standard drinks of alcohol per week. He reports previous drug use.   Physical Exam: There were no  vitals taken for this visit.  Constitutional:  Alert and oriented, No acute distress. HEENT: [place] AT, moist mucus membranes.  Trachea midline, no masses. Cardiovascular: No clubbing, cyanosis, or edema. Respiratory: Normal respiratory effort, no increased work of breathing. GI: Abdomen is soft, nontender, nondistended, no abdominal masses GU: No CVA tenderness.   Lymph: No cervical or inguinal lymphadenopathy. Skin: No rashes, bruises or suspicious lesions. Neurologic: Grossly intact, no focal deficits, moving all 4 extremities. Psychiatric: Normal mood and affect.  Laboratory Data:  Lab Results  Component Value Date   CREATININE 0.88 09/18/2019    No results found for: PSA  No results found for: TESTOSTERONE  No results found for: HGBA1C  Urinalysis   Pertinent Imaging:    Assessment & Plan:     No follow-ups on file.  [facility] [address], [place], [place] [address] [phone]

## 2020-08-03 ENCOUNTER — Ambulatory Visit: Payer: Self-pay | Admitting: Urology

## 2020-08-08 ENCOUNTER — Ambulatory Visit (INDEPENDENT_AMBULATORY_CARE_PROVIDER_SITE_OTHER): Payer: Medicare Other | Admitting: Urology

## 2020-08-08 ENCOUNTER — Other Ambulatory Visit: Payer: Self-pay

## 2020-08-08 VITALS — BP 160/70 | HR 80 | Ht 66.0 in | Wt 170.0 lb

## 2020-08-08 DIAGNOSIS — N401 Enlarged prostate with lower urinary tract symptoms: Secondary | ICD-10-CM | POA: Diagnosis not present

## 2020-08-08 DIAGNOSIS — N5203 Combined arterial insufficiency and corporo-venous occlusive erectile dysfunction: Secondary | ICD-10-CM | POA: Diagnosis not present

## 2020-08-08 DIAGNOSIS — N486 Induration penis plastica: Secondary | ICD-10-CM | POA: Diagnosis not present

## 2020-08-08 DIAGNOSIS — N430 Encysted hydrocele: Secondary | ICD-10-CM

## 2020-08-08 DIAGNOSIS — R3912 Poor urinary stream: Secondary | ICD-10-CM

## 2020-08-08 MED ORDER — SILDENAFIL CITRATE 20 MG PO TABS: ORAL_TABLET | ORAL | 6 refills | Status: DC

## 2020-08-08 NOTE — Progress Notes (Signed)
08/08/2020 4:11 PM   Rodney Howe 11-04-1950 [id number]  Referring provider: Center, [facility] [place],  [place] [address]  Chief Complaint  Patient presents with  . Benign Prostatic Hypertrophy    HPI: 70 year old male who returns today to discuss multiple GU issues.  Today is extremely talkative.  He is primarily focused on a lip laceration.  Question possible intoxication.  He was in [place] was today that he continues have ongoing urinary symptoms with thinks the Flomax and finasteride are helping.  He went to refill these early and the pharmacist told him he could not pick up the prescriptions quite yet.  He is little bit irritated about this.  In addition to that, he is worried about his penis not working to fulfill his wife's needs.  He reports that he has some curvature but he also endorses erectile dysfunction with inability achieve and maintain erections.  He is never previously tried Viagra and has no contraindications for this.  He is also worried today about the size of his testicles.  He has known bilateral hydroceles.  He is less worried about this particular issue today.   PMH: Past Medical History:  Diagnosis Date  . COPD (chronic obstructive pulmonary disease) ([place])     Surgical History: Past Surgical History:  Procedure Laterality Date  . ANKLE SURGERY      Home Medications:  Allergies as of 08/08/2020   No Known Allergies     Medication List       Accurate as of August 08, 2020  4:11 PM. If you have any questions, ask your nurse or doctor.        albuterol 108 (90 Base) MCG/ACT inhaler Commonly known as: Ventolin HFA Inhale 2 puffs into the lungs every 6 (six) hours as needed for wheezing or shortness of breath.   amitriptyline 25 MG tablet Commonly known as: ELAVIL Take by mouth.   aspirin EC 81 MG tablet Take 81 mg by mouth daily.   atorvastatin 40 MG tablet Commonly known as: LIPITOR Take 40 mg by  mouth every evening.   budesonide-formoterol 80-4.5 MCG/ACT inhaler Commonly known as: SYMBICORT Inhale 2 puffs into the lungs 2 (two) times daily.   finasteride 5 MG tablet Commonly known as: PROSCAR Take 1 tablet (5 mg total) by mouth daily.   gabapentin 300 MG capsule Commonly known as: NEURONTIN Take 300 mg by mouth 2 (two) times daily.   omeprazole 20 MG capsule Commonly known as: PRILOSEC Take 20 mg by mouth daily.   tamsulosin 0.4 MG Caps capsule Commonly known as: Flomax Take 1 capsule (0.4 mg total) by mouth daily.       Allergies: No Known Allergies  Family History: No family history on file.  Social History:  reports that he has been smoking cigarettes. He has a 98.00 pack-year smoking history. He has never used smokeless tobacco. He reports current alcohol use of about 36.0 standard drinks of alcohol per week. He reports previous drug use.   Physical Exam: BP (!) 160/70   Pulse 80   Ht 5\' 6"  (1.676 m)   Wt 170 lb (77.1 kg)   BMI 27.44 kg/m   Constitutional:  Alert and oriented, No acute distress. HEENT: [place] AT, moist mucus membranes.  Trachea midline, no masses. Cardiovascular: No clubbing, cyanosis, or edema. Respiratory: Normal respiratory effort, no increased work of breathing. GI: Abdomen is soft, nontender, nondistended, no abdominal masses GU: No CVA tenderness Lymph: No cervical or  inguinal lymphadenopathy. Skin: No rashes, bruises or suspicious lesions. Neurologic: Grossly intact, no focal deficits, moving all 4 extremities. Psychiatric: Normal mood and affect.  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 Encysted hydrocele Declined hydrocelectomy   F/u 3 months   Hollice Espy, MD  [facility] [address] [place], [place] [address] [phone]  I spent 30 total minutes on the day of the encounter including pre-visit review of the medical record, face-to-face time with the patient, and post visit ordering of labs/imaging/tests.  Tangential conversation today, lots of reorienting.

## 2020-09-04 ENCOUNTER — Other Ambulatory Visit: Payer: Self-pay

## 2020-09-04 ENCOUNTER — Telehealth (INDEPENDENT_AMBULATORY_CARE_PROVIDER_SITE_OTHER): Payer: Self-pay | Admitting: Gastroenterology

## 2020-09-04 DIAGNOSIS — Z86010 Personal history of colonic polyps: Secondary | ICD-10-CM

## 2020-09-04 NOTE — Progress Notes (Signed)
Gastroenterology Pre-Procedure Review  Request Date: Tuesday 09/19/20 Requesting Physician: Dr. Vicente Males  PATIENT REVIEW QUESTIONS: The patient responded to the following health history questions as indicated:    1. Are you having any GI issues? no 2. Do you have a personal history of Polyps? yes (Patient states he has a history of colon polyps) 3. Do you have a family history of Colon Cancer or Polyps? no 4. Diabetes Mellitus? no 5. Joint replacements in the past 12 months?no 6. Major health problems in the past 3 months?no 7. Any artificial heart valves, MVP, or defibrillator?no    MEDICATIONS & ALLERGIES:    Patient reports the following regarding taking any anticoagulation/antiplatelet therapy:   Plavix, Coumadin, Eliquis, Xarelto, Lovenox, Pradaxa, Brilinta, or Effient? no Aspirin?81 mg daily  Patient confirms/reports the following medications:  Current Outpatient Medications  Medication Sig Dispense Refill  . albuterol (VENTOLIN HFA) 108 (90 Base) MCG/ACT inhaler Inhale 2 puffs into the lungs every 6 (six) hours as needed for wheezing or shortness of breath. 1 Inhaler 1  . amitriptyline (ELAVIL) 25 MG tablet Take by mouth.    Marland Kitchen aspirin EC 81 MG tablet Take 81 mg by mouth daily.    Marland Kitchen atorvastatin (LIPITOR) 40 MG tablet Take 40 mg by mouth every evening.    . budesonide-formoterol (SYMBICORT) 80-4.5 MCG/ACT inhaler Inhale 2 puffs into the lungs 2 (two) times daily. 1 Inhaler 1  . finasteride (PROSCAR) 5 MG tablet Take 1 tablet (5 mg total) by mouth daily. 30 tablet 11  . gabapentin (NEURONTIN) 300 MG capsule Take 300 mg by mouth 2 (two) times daily.    Marland Kitchen omeprazole (PRILOSEC) 20 MG capsule Take 20 mg by mouth daily.    . sildenafil (REVATIO) 20 MG tablet 2-5 tablets as needed 1 hour prior to intercourse 30 tablet 6  . tamsulosin (FLOMAX) 0.4 MG CAPS capsule Take 1 capsule (0.4 mg total) by mouth daily. 30 capsule 11  . SPIRIVA HANDIHALER 18 MCG inhalation capsule 1 capsule daily.      No current facility-administered medications for this visit.    Patient confirms/reports the following allergies:  No Known Allergies  No orders of the defined types were placed in this encounter.   AUTHORIZATION INFORMATION Primary Insurance: 1D#: Group #:  Secondary Insurance: 1D#: Group #:  SCHEDULE INFORMATION: Date: Tuesday 09/19/20 Time: Location:[facility]

## 2020-09-19 ENCOUNTER — Ambulatory Visit: Payer: Medicare Other | Admitting: Certified Registered"

## 2020-09-19 ENCOUNTER — Encounter: Payer: Self-pay | Admitting: Gastroenterology

## 2020-09-19 ENCOUNTER — Ambulatory Visit
Admission: RE | Admit: 2020-09-19 | Discharge: 2020-09-19 | Disposition: A | Discharge status: Home / Self Care | Payer: Medicare Other | Attending: Gastroenterology | Admitting: Gastroenterology

## 2020-09-19 ENCOUNTER — Encounter
Admission: RE | Disposition: A | Discharge status: Home / Self Care | Payer: Self-pay | Source: Home / Self Care | Attending: Gastroenterology

## 2020-09-19 DIAGNOSIS — Z79899 Other long term (current) drug therapy: Secondary | ICD-10-CM | POA: Diagnosis not present

## 2020-09-19 DIAGNOSIS — D124 Benign neoplasm of descending colon: Secondary | ICD-10-CM | POA: Diagnosis not present

## 2020-09-19 DIAGNOSIS — K5730 Diverticulosis of large intestine without perforation or abscess without bleeding: Secondary | ICD-10-CM | POA: Diagnosis not present

## 2020-09-19 DIAGNOSIS — Z87891 Personal history of nicotine dependence: Secondary | ICD-10-CM | POA: Insufficient documentation

## 2020-09-19 DIAGNOSIS — Z86010 Personal history of colonic polyps: Secondary | ICD-10-CM

## 2020-09-19 DIAGNOSIS — Z1211 Encounter for screening for malignant neoplasm of colon: Secondary | ICD-10-CM

## 2020-09-19 DIAGNOSIS — Z7951 Long term (current) use of inhaled steroids: Secondary | ICD-10-CM | POA: Insufficient documentation

## 2020-09-19 DIAGNOSIS — Z7982 Long term (current) use of aspirin: Secondary | ICD-10-CM | POA: Diagnosis not present

## 2020-09-19 DIAGNOSIS — D125 Benign neoplasm of sigmoid colon: Secondary | ICD-10-CM | POA: Insufficient documentation

## 2020-09-19 DIAGNOSIS — K635 Polyp of colon: Secondary | ICD-10-CM | POA: Diagnosis not present

## 2020-09-19 HISTORY — PX: COLONOSCOPY WITH PROPOFOL: SHX5780

## 2020-09-19 SURGERY — COLONOSCOPY WITH PROPOFOL
Anesthesia: General

## 2020-09-19 MED ORDER — PROPOFOL 10 MG/ML IV BOLUS
INTRAVENOUS | Status: DC | PRN
  Administered 2020-09-19: 80 mg via INTRAVENOUS

## 2020-09-19 MED ORDER — SODIUM CHLORIDE 0.9 % IV SOLN
INTRAVENOUS | Status: DC
Start: 2020-09-19 — End: 2020-09-19

## 2020-09-19 MED ORDER — PROPOFOL 500 MG/50ML IV EMUL
INTRAVENOUS | Status: DC | PRN
  Administered 2020-09-19: 150 ug/kg/min via INTRAVENOUS

## 2020-09-19 NOTE — Op Note (Addendum)
[facility]astroenterology Patient Name: Rodney Howe Procedure Date: 09/19/2020 9:11 AM MRN: [id number] Account #: [id number] Date of Birth: May 27, 1951 Admit Type: Outpatient Age: 70 Room: [facility] ENDO ROOM 2 Gender: Male Note Status: Finalized Procedure:             Colonoscopy Indications:           Screening for colorectal malignant neoplasm Providers:             Jonathon Bellows MD, MD Referring MD:          Winona, MD (Referring MD) Medicines:             Monitored Anesthesia Care Complications:         No immediate complications. Procedure:             Pre-Anesthesia Assessment:                        - Prior to the procedure, a History and Physical was                         performed, and patient medications, allergies and                         sensitivities were reviewed. The patient's tolerance                         of previous anesthesia was reviewed.                        - The risks and benefits of the procedure and the                         sedation options and risks were discussed with the                         patient. All questions were answered and informed                         consent was obtained.                        - ASA Grade Assessment: II - A patient with mild                         systemic disease.                        After obtaining informed consent, the colonoscope was                         passed under direct vision. Throughout the procedure,                         the patient's blood pressure, pulse, and oxygen                         saturations were monitored continuously. The                         Colonoscope was introduced through  the anus and                         advanced to the the cecum, identified by the                         appendiceal orifice. The colonoscopy was performed                         with ease. The patient tolerated the procedure well.                          The quality of the bowel preparation was excellent. Findings:      The perianal and digital rectal examinations were normal.      A 6 mm polyp was found in the ascending colon. The polyp was sessile.       The polyp was removed with a cold snare. Resection and retrieval were       complete.      Two sessile polyps were found in the sigmoid colon and descending colon.       The polyps were 4 to 5 mm in size. These polyps were removed with a cold       snare. Resection and retrieval were complete.      The exam was otherwise without abnormality on direct and retroflexion       views.      A few small-mouthed diverticula were found in the left colon and right       colon.      The exam was otherwise without abnormality on direct and retroflexion       views. Impression:            - One 6 mm polyp in the ascending colon, removed with                         a cold snare. Resected and retrieved.                        - Two 4 to 5 mm polyps in the sigmoid colon and in the                         descending colon, removed with a cold snare. Resected                         and retrieved.                        - The examination was otherwise normal on direct and                         retroflexion views.                        - Diverticulosis in the left colon and in the right                         colon.                        - The examination was otherwise normal  on direct and                         retroflexion views. Recommendation:        - Discharge patient to home (with escort).                        - Resume previous diet.                        - Continue present medications.                        - Await pathology results.                        - Repeat colonoscopy for surveillance based on                         pathology results. Procedure Code(s):     --- Professional ---                        [phone], Colonoscopy, flexible; with removal of                          tumor(s), polyp(s), or other lesion(s) by snare                         technique Diagnosis Code(s):     --- Professional ---                        K63.5, Polyp of colon                        Z12.11, Encounter for screening for malignant neoplasm                         of colon                        K57.30, Diverticulosis of large intestine without                         perforation or abscess without bleeding CPT copyright 2019 American Medical Association. All rights reserved. The codes documented in this report are preliminary and upon coder review may  be revised to meet current compliance requirements. Jonathon Bellows, MD Jonathon Bellows MD, MD 09/19/2020 10:54:27 AM This report has been signed electronically. Number of Addenda: 0 Note Initiated On: 09/19/2020 9:11 AM Scope Withdrawal Time: 0 hours 12 minutes 26 seconds  Total Procedure Duration: 0 hours 15 minutes 50 seconds  Estimated Blood Loss:  Estimated blood loss: none.      [facility]

## 2020-09-19 NOTE — Transfer of Care (Signed)
Immediate Anesthesia Transfer of Care Note  Patient: Rodney Howe  Procedure(s) Performed: COLONOSCOPY WITH PROPOFOL (N/A )  Patient Location: PACU and Endoscopy Unit  Anesthesia Type:General  Level of Consciousness: drowsy  Airway & Oxygen Therapy: Patient Spontanous Breathing  Post-op Assessment: Report given to RN  Post vital signs: stable  Last Vitals:  Vitals Value Taken Time  BP    Temp    Pulse    Resp    SpO2      Last Pain:  Vitals:   09/19/20 0900  TempSrc: Temporal  PainSc: 0-No pain         Complications: No complications documented.

## 2020-09-19 NOTE — Anesthesia Preprocedure Evaluation (Signed)
Anesthesia Evaluation  Patient identified by MRN, date of birth, ID band Patient awake    Reviewed: Allergy & Precautions, H&P , NPO status , Patient's Chart, lab work & pertinent test results  History of Anesthesia Complications Negative for: history of anesthetic complications  Airway Mallampati: II  TM Distance: >3 FB     Dental  (+) Chipped   Pulmonary neg sleep apnea, COPD, Current Smoker and Patient abstained from smoking.,    breath sounds clear to auscultation       Cardiovascular (-) angina(-) Past MI and (-) Cardiac Stents negative cardio ROS  (-) dysrhythmias  Rhythm:regular Rate:Normal     Neuro/Psych negative neurological ROS  negative psych ROS   GI/Hepatic negative GI ROS, (+)     substance abuse (drinks "two fifths per week")  alcohol use,   Endo/Other  negative endocrine ROS  Renal/GU negative Renal ROS  negative genitourinary   Musculoskeletal   Abdominal   Peds  Hematology negative hematology ROS (+)   Anesthesia Other Findings Past Medical History: No date: COPD (chronic obstructive pulmonary disease) (HCC)  Past Surgical History: No date: ANKLE SURGERY  BMI    Body Mass Index: 24.21 kg/m      Reproductive/Obstetrics negative OB ROS                             Anesthesia Physical Anesthesia Plan  ASA: III  Anesthesia Plan: General   Post-op Pain Management:    Induction:   PONV Risk Score and Plan: Propofol infusion and TIVA  Airway Management Planned: Nasal Cannula  Additional Equipment:   Intra-op Plan:   Post-operative Plan:   Informed Consent: I have reviewed the patients History and Physical, chart, labs and discussed the procedure including the risks, benefits and alternatives for the proposed anesthesia with the patient or authorized representative who has indicated his/her understanding and acceptance.     Dental Advisory  Given  Plan Discussed with: Anesthesiologist, CRNA and Surgeon  Anesthesia Plan Comments:         Anesthesia Quick Evaluation

## 2020-09-19 NOTE — H&P (Signed)
Rodney Bellows, MD [address], [place], [place], [place], [address] [address], Rodney Howe, [place], [place], [address] Phone: [phone]  Fax: [phone]  Primary Care Physician:  [facility]   Pre-Procedure History & Physical: HPI:  Rodney Howe is a 70 y.o. male is here for an colonoscopy.   Past Medical History:  Diagnosis Date  . COPD (chronic obstructive pulmonary disease) ([place])     Past Surgical History:  Procedure Laterality Date  . ANKLE SURGERY      Prior to Admission medications   Medication Sig Start Date End Date Taking? Authorizing Provider  albuterol (VENTOLIN HFA) 108 (90 Base) MCG/ACT inhaler Inhale 2 puffs into the lungs every 6 (six) hours as needed for wheezing or shortness of breath. 11/17/17  Yes Henreitta Leber, MD  amitriptyline (ELAVIL) 25 MG tablet Take by mouth. 02/05/12  Yes [provider]  aspirin EC 81 MG tablet Take 81 mg by mouth daily.   Yes [provider]  atorvastatin (LIPITOR) 40 MG tablet Take 40 mg by mouth every evening.   Yes [provider]  budesonide-formoterol (SYMBICORT) 80-4.5 MCG/ACT inhaler Inhale 2 puffs into the lungs 2 (two) times daily. 11/17/17  Yes Henreitta Leber, MD  finasteride (PROSCAR) 5 MG tablet Take 1 tablet (5 mg total) by mouth daily. 04/26/20  Yes Hollice Espy, MD  omeprazole (PRILOSEC) 20 MG capsule Take 20 mg by mouth daily.   Yes [provider]  SPIRIVA HANDIHALER 18 MCG inhalation capsule 1 capsule daily. 08/08/20  Yes [provider]  tamsulosin (FLOMAX) 0.4 MG CAPS capsule Take 1 capsule (0.4 mg total) by mouth daily. 04/26/20  Yes Hollice Espy, MD  gabapentin (NEURONTIN) 300 MG capsule Take 300 mg by mouth 2 (two) times daily.    [provider]  sildenafil (REVATIO) 20 MG tablet 2-5 tablets as needed 1 hour prior to intercourse 08/08/20   Hollice Espy, MD    Allergies as of 09/04/2020  . (No Known Allergies)     History reviewed. No pertinent family history.  Social History   Socioeconomic History  . Marital status: Single    Spouse name: Not on file  . Number of children: Not on file  . Years of education: Not on file  . Highest education level: Not on file  Occupational History  . Not on file  Tobacco Use  . Smoking status: Former Smoker    Packs/day: 2.00    Years: 49.00    Pack years: 98.00    Types: Cigarettes    Quit date: 07/11/2020    Years since quitting: 0.1  . Smokeless tobacco: Never Used  Substance and Sexual Activity  . Alcohol use: Yes    Alcohol/week: 36.0 standard drinks    Types: 36 Cans of beer per week  . Drug use: Not Currently  . Sexual activity: Not on file  Other Topics Concern  . Not on file  Social History Narrative  . Not on file   Social Determinants of Health   Financial Resource Strain: Not on file  Food Insecurity: Not on file  Transportation Needs: Not on file  Physical Activity: Not on file  Stress: Not on file  Social Connections: Not on file  Intimate Partner Violence: Not on file    Review of Systems: See HPI, otherwise negative ROS  Physical Exam: BP (!) 140/91   Pulse 80   Temp 97.6 F (36.4 C) (Temporal)   Resp 18  Ht 5\' 6"  (1.676 m)   Wt 68 kg   SpO2 99%   BMI 24.21 kg/m  General:   Alert,  pleasant and cooperative in NAD Head:  Normocephalic and atraumatic. Neck:  Supple; no masses or thyromegaly. Lungs:  Clear throughout to auscultation, normal respiratory effort.    Heart:  +S1, +S2, Regular rate and rhythm, No edema. Abdomen:  Soft, nontender and nondistended. Normal bowel sounds, without guarding, and without rebound.   Neurologic:  Alert and  oriented x4;  grossly normal neurologically.  Impression/Plan: [name] is here for an colonoscopy to be performed for surveillance due to prior history of colon polyps   Risks, benefits, limitations, and alternatives regarding  colonoscopy have been  reviewed with the patient.  Questions have been answered.  All parties agreeable.   Rodney Bellows, MD  09/19/2020, 9:12 AM

## 2020-09-20 ENCOUNTER — Encounter: Payer: Self-pay | Admitting: Gastroenterology

## 2020-09-20 LAB — SURGICAL PATHOLOGY

## 2020-09-21 ENCOUNTER — Encounter: Payer: Self-pay | Admitting: Gastroenterology

## 2020-09-21 NOTE — Anesthesia Postprocedure Evaluation (Signed)
Anesthesia Post Note  Patient: Rodney Howe  Procedure(s) Performed: COLONOSCOPY WITH PROPOFOL (N/A )  Patient location during evaluation: PACU Anesthesia Type: General Level of consciousness: awake and alert Pain management: pain level controlled Vital Signs Assessment: post-procedure vital signs reviewed and stable Respiratory status: spontaneous breathing, nonlabored ventilation and respiratory function stable Cardiovascular status: blood pressure returned to baseline and stable Postop Assessment: no apparent nausea or vomiting Anesthetic complications: no   No complications documented.   Last Vitals:  Vitals:   09/19/20 0900 09/19/20 1057  BP: (!) 140/91   Pulse: 80   Resp: 18   Temp: 36.4 C (!) 36.1 C  SpO2: 99%     Last Pain:  Vitals:   09/19/20 1123  TempSrc:   PainSc: 0-No pain                 Brett Canales Darrion Macaulay

## 2020-11-15 ENCOUNTER — Other Ambulatory Visit: Payer: Self-pay

## 2020-11-15 ENCOUNTER — Ambulatory Visit (INDEPENDENT_AMBULATORY_CARE_PROVIDER_SITE_OTHER): Payer: Medicare Other | Admitting: Urology

## 2020-11-15 VITALS — BP 110/68 | HR 64 | Wt 150.0 lb

## 2020-11-15 DIAGNOSIS — R3912 Poor urinary stream: Secondary | ICD-10-CM | POA: Diagnosis not present

## 2020-11-15 DIAGNOSIS — N486 Induration penis plastica: Secondary | ICD-10-CM

## 2020-11-15 DIAGNOSIS — N401 Enlarged prostate with lower urinary tract symptoms: Secondary | ICD-10-CM | POA: Diagnosis not present

## 2020-11-15 NOTE — Progress Notes (Signed)
11/15/2020 3:34 PM   Rodney Howe 1950/12/08 [id number]  Referring provider: [facility] [name],  [name]  Chief Complaint  Patient presents with   Combined arterial insufficiency and corporo-venous occlusiv    HPI: 70 year old male with multiple GU issues returns today to the office for follow-up.  Last visit, he was somewhat tangential and was having difficulty focusing on a single issue.  Today, he is more calm and focused.  His primary concern today is his penile curvature.  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  This makes erections prohibitive.  He has not been using sildenafil but is his inability to penetrate secondary to the curvature.    Urinary symptoms improved on maximal medical therapy, Flomax and finasteride which he continues.  He has had anatomic evaluation for possible UroLift but seems to be more focused on his erectile function rather than urinary issues at this time.   PMH: Past Medical History:  Diagnosis Date   COPD (chronic obstructive pulmonary disease) ([place])     Surgical History: Past Surgical History:  Procedure Laterality Date   ANKLE SURGERY     COLONOSCOPY WITH PROPOFOL N/A 09/19/2020   Procedure: COLONOSCOPY WITH PROPOFOL;  Surgeon: Jonathon Bellows, MD;  Location: [facility] ENDOSCOPY;  Service: Gastroenterology;  Laterality: N/A;    Home Medications:  Allergies as of 11/15/2020   No Known Allergies      Medication List        Accurate as of November 15, 2020  3:34 PM. If you have any questions, ask your nurse or doctor.          albuterol 108 (90 Base) MCG/ACT inhaler Commonly known as: Ventolin HFA Inhale 2 puffs into the lungs every 6 (six) hours as needed for wheezing or shortness of breath.   amitriptyline 25 MG tablet Commonly known as: ELAVIL Take by mouth.   aspirin EC 81 MG tablet Take 81 mg by mouth daily.   atorvastatin 40 MG  tablet Commonly known as: LIPITOR Take 40 mg by mouth every evening.   budesonide-formoterol 80-4.5 MCG/ACT inhaler Commonly known as: SYMBICORT Inhale 2 puffs into the lungs 2 (two) times daily.   finasteride 5 MG tablet Commonly known as: PROSCAR Take 1 tablet (5 mg total) by mouth daily.   gabapentin 300 MG capsule Commonly known as: NEURONTIN Take 300 mg by mouth 2 (two) times daily. What changed: Another medication with the same name was removed. Continue taking this medication, and follow the directions you see here. Changed by: Hollice Espy, MD   omeprazole 20 MG capsule Commonly known as: PRILOSEC Take 20 mg by mouth daily.   sildenafil 20 MG tablet Commonly known as: REVATIO 2-5 tablets as needed 1 hour prior to intercourse   Spiriva HandiHaler 18 MCG inhalation capsule Generic drug: tiotropium 1 capsule daily.   tamsulosin 0.4 MG Caps capsule Commonly known as: Flomax Take 1 capsule (0.4 mg total) by mouth daily.        Allergies: No Known Allergies  Family History: No family history on file.  Social History:  reports that he quit smoking about 4 months ago. His smoking use included cigarettes. He has a 98.00 pack-year smoking history. He has never used smokeless tobacco. He reports current alcohol use of about 36.0 standard drinks of alcohol per week. He reports previous drug use.   Physical Exam: BP 110/68   Pulse 64   Wt 150 lb (68  kg)   BMI 24.21 kg/m   Constitutional:  Alert and oriented, No acute distress. HEENT: [place] AT, moist mucus membranes.  Trachea midline, no masses. Cardiovascular: No clubbing, cyanosis, or edema. Respiratory: Normal respiratory effort, no increased work of breathing. Skin: No rashes, bruises or suspicious lesions. Neurologic: Grossly intact, no focal deficits, moving all 4 extremities. Psychiatric: Normal mood and affect.  Laboratory Data: Lab Results  Component Value Date   CREATININE 0.88 09/18/2019     Assessment & Plan:    1. Peyronie disease Previous discussions about management of his Peyronie's disease  He is now interested in pursuing collagenase if it is affordable.  We discussed the protocol at length.  We will regard prior authorization and cost estimate.  He understands the risks of the procedure including failure to resolve his curvature, penile discomfort/pain and bruising as well as risk of penile fracture.  He understands that he will need to abstain from sexual intercourse or any sexual activity for at least 4 weeks post injection therapy.    2. Benign prostatic hyperplasia with weak urinary stream Continue Flomax and finasteride for the time being, consider other procedures such as UroLift Elavil  Reviewing the chart after the patient left today, relates that he has never had a PSA.  We will obtain that next visit.    Hollice Espy, MD  [facility] [address] [place], [place] [address] [phone]  I spent 32 total minutes on the day of the encounter including pre-visit review of the medical record, face-to-face time with the patient, and post visit ordering of labs/imaging/tests.  Most the time today was spent face-to-face time with the patient he was very talkative.

## 2020-11-18 ENCOUNTER — Emergency Department
Admission: EM | Admit: 2020-11-18 | Discharge: 2020-11-18 | Disposition: A | Discharge status: Home / Self Care | Payer: Medicare Other | Attending: Student in an Organized Health Care Education/Training Program | Admitting: Student in an Organized Health Care Education/Training Program

## 2020-11-18 ENCOUNTER — Other Ambulatory Visit: Payer: Self-pay

## 2020-11-18 ENCOUNTER — Emergency Department: Payer: Medicare Other

## 2020-11-18 ENCOUNTER — Encounter: Payer: Self-pay | Admitting: Emergency Medicine

## 2020-11-18 DIAGNOSIS — Z87891 Personal history of nicotine dependence: Secondary | ICD-10-CM | POA: Insufficient documentation

## 2020-11-18 DIAGNOSIS — Z7951 Long term (current) use of inhaled steroids: Secondary | ICD-10-CM | POA: Insufficient documentation

## 2020-11-18 DIAGNOSIS — Z7982 Long term (current) use of aspirin: Secondary | ICD-10-CM | POA: Diagnosis not present

## 2020-11-18 DIAGNOSIS — R1032 Left lower quadrant pain: Secondary | ICD-10-CM | POA: Diagnosis present

## 2020-11-18 DIAGNOSIS — K5792 Diverticulitis of intestine, part unspecified, without perforation or abscess without bleeding: Secondary | ICD-10-CM | POA: Diagnosis not present

## 2020-11-18 DIAGNOSIS — J449 Chronic obstructive pulmonary disease, unspecified: Secondary | ICD-10-CM | POA: Insufficient documentation

## 2020-11-18 LAB — COMPREHENSIVE METABOLIC PANEL
ALT: 25 U/L (ref 0–44)
AST: 24 U/L (ref 15–41)
Albumin: 3.3 g/dL — ABNORMAL LOW (ref 3.5–5.0)
Alkaline Phosphatase: 60 U/L (ref 38–126)
Anion gap: 6 (ref 5–15)
BUN: 10 mg/dL (ref 8–23)
CO2: 24 mmol/L (ref 22–32)
Calcium: 8.6 mg/dL — ABNORMAL LOW (ref 8.9–10.3)
Chloride: 105 mmol/L (ref 98–111)
Creatinine, Ser: 0.83 mg/dL (ref 0.61–1.24)
GFR, Estimated: >60 mL/min (ref >60)
Glucose, Bld: 149 mg/dL — ABNORMAL HIGH (ref 70–99)
Potassium: 3.5 mmol/L (ref 3.5–5.1)
Sodium: 135 mmol/L (ref 135–145)
Total Bilirubin: 0.9 mg/dL (ref 0.3–1.2)
Total Protein: 6.5 g/dL (ref 6.5–8.1)

## 2020-11-18 LAB — URINALYSIS, COMPLETE (UACMP) WITH MICROSCOPIC
Bacteria, UA: NONE SEEN
Bilirubin Urine: NEGATIVE
Glucose, UA: NEGATIVE mg/dL
Ketones, ur: NEGATIVE mg/dL
Leukocytes,Ua: NEGATIVE
Nitrite: NEGATIVE
Protein, ur: NEGATIVE mg/dL
Specific Gravity, Urine: 1.01 (ref 1.005–1.030)
pH: 7 (ref 5.0–8.0)

## 2020-11-18 LAB — CBC
HCT: 41.3 % (ref 39.0–52.0)
Hemoglobin: 14.4 g/dL (ref 13.0–17.0)
MCH: 32.6 pg (ref 26.0–34.0)
MCHC: 34.9 g/dL (ref 30.0–36.0)
MCV: 93.4 fL (ref 80.0–100.0)
Platelets: 328 10*3/uL (ref 150–400)
RBC: 4.42 MIL/uL (ref 4.22–5.81)
RDW: 11.7 % (ref 11.5–15.5)
WBC: 10.7 10*3/uL — ABNORMAL HIGH (ref 4.0–10.5)
nRBC: 0 % (ref 0.0–0.2)

## 2020-11-18 LAB — LIPASE, BLOOD: Lipase: 30 U/L (ref 11–51)

## 2020-11-18 MED ORDER — KETOROLAC TROMETHAMINE 30 MG/ML IJ SOLN
15.0000 mg | Freq: Once | INTRAMUSCULAR | Status: AC
  Administered 2020-11-18: 15 mg via INTRAVENOUS
  Filled 2020-11-18: qty 1

## 2020-11-18 MED ORDER — HYDROCODONE-ACETAMINOPHEN 5-325 MG PO TABS
1.0000 | ORAL_TABLET | Freq: Once | ORAL | Status: AC
  Administered 2020-11-18: 1 via ORAL
  Filled 2020-11-18: qty 1

## 2020-11-18 MED ORDER — ONDANSETRON HCL 4 MG PO TABS: 4.0000 mg | ORAL_TABLET | Freq: Every day | ORAL | 0 refills | Status: DC | PRN

## 2020-11-18 MED ORDER — HYDROCODONE-ACETAMINOPHEN 5-325 MG PO TABS: 1.0000 | ORAL_TABLET | ORAL | 0 refills | Status: DC | PRN

## 2020-11-18 MED ORDER — MORPHINE SULFATE (PF) 4 MG/ML IV SOLN
4.0000 mg | INTRAVENOUS | Status: DC | PRN
  Administered 2020-11-18: 4 mg via INTRAVENOUS
  Filled 2020-11-18: qty 1

## 2020-11-18 MED ORDER — AMOXICILLIN-POT CLAVULANATE 875-125 MG PO TABS: 1.0000 | ORAL_TABLET | Freq: Two times a day (BID) | ORAL | 0 refills | Status: AC

## 2020-11-18 MED ORDER — AMOXICILLIN-POT CLAVULANATE 875-125 MG PO TABS
1.0000 | ORAL_TABLET | Freq: Once | ORAL | Status: AC
  Administered 2020-11-18: 1 via ORAL
  Filled 2020-11-18: qty 1

## 2020-11-18 MED ORDER — ONDANSETRON HCL 4 MG/2ML IJ SOLN
4.0000 mg | Freq: Once | INTRAMUSCULAR | Status: AC
  Administered 2020-11-18: 4 mg via INTRAVENOUS
  Filled 2020-11-18: qty 2

## 2020-11-18 NOTE — ED Triage Notes (Signed)
Pt reports that  he started having LLQ pain yesterday. He has been having some Diarrhea the last several days. Denies any N/V. He reports that he feels bloated.

## 2020-11-18 NOTE — Discharge Instructions (Addendum)

## 2020-11-18 NOTE — ED Notes (Signed)
Pt given meal tray at this time. Will access if pt is able to tolerate

## 2020-11-18 NOTE — ED Notes (Signed)
Pt transported to CT ?

## 2020-11-18 NOTE — ED Provider Notes (Signed)
[facility] Emergency Department Provider Note    Event Date/Time   First MD Initiated Contact with Patient 11/18/20 1505     (approximate)  I have reviewed the triage vital signs and the nursing notes.   HISTORY  Chief Complaint Abdominal Pain    HPI Rodney Howe is a 70 y.o. male below listed past medical history presents to the ER for evaluation of less than 24 hours of left lower quadrant abdominal pain and bloating.  Still is passing gas.  Has had some loose nonbloody nonmelanotic stool.  No measured fevers.  Denies any history of diverticulosis.  States he has had colonoscopies in the past which were "normal" but review of records shows h/o polyps and diverticula.  Denies any chest pain or short of breath.  No pain radiating through to his back.  No flank pain.  No dysuria.  Past Medical History:  Diagnosis Date   COPD (chronic obstructive pulmonary disease) ([place])    History reviewed. No pertinent family history. Past Surgical History:  Procedure Laterality Date   ANKLE SURGERY     COLONOSCOPY WITH PROPOFOL N/A 09/19/2020   Procedure: COLONOSCOPY WITH PROPOFOL;  Surgeon: Jonathon Bellows, MD;  Location: [facility] ENDOSCOPY;  Service: Gastroenterology;  Laterality: N/A;   Patient Active Problem List   Diagnosis Date Noted   Sepsis ([place]) 11/16/2017   Hyperlipidemia 06/24/2013   FHx: colon cancer 05/19/2013   Heartburn 05/19/2013   Tobacco abuse 05/19/2013      Prior to Admission medications   Medication Sig Start Date End Date Taking? Authorizing Provider  amoxicillin-clavulanate (AUGMENTIN) 875-125 MG tablet Take 1 tablet by mouth 2 (two) times daily for 7 days. 11/18/20 11/25/20 Yes Merlyn Lot, MD  HYDROcodone-acetaminophen (NORCO) 5-325 MG tablet Take 1 tablet by mouth every 4 (four) hours as needed for moderate pain. 11/18/20  Yes Merlyn Lot, MD  ondansetron (ZOFRAN) 4 MG tablet Take 1 tablet (4 mg total) by mouth daily as  needed. 11/18/20 11/18/21 Yes Merlyn Lot, MD  albuterol (VENTOLIN HFA) 108 (90 Base) MCG/ACT inhaler Inhale 2 puffs into the lungs every 6 (six) hours as needed for wheezing or shortness of breath. 11/17/17   Henreitta Leber, MD  amitriptyline (ELAVIL) 25 MG tablet Take by mouth. 02/05/12   [provider]  aspirin EC 81 MG tablet Take 81 mg by mouth daily.    [provider]  atorvastatin (LIPITOR) 40 MG tablet Take 40 mg by mouth every evening.    [provider]  budesonide-formoterol (SYMBICORT) 80-4.5 MCG/ACT inhaler Inhale 2 puffs into the lungs 2 (two) times daily. 11/17/17   Henreitta Leber, MD  finasteride (PROSCAR) 5 MG tablet Take 1 tablet (5 mg total) by mouth daily. 04/26/20   Hollice Espy, MD  gabapentin (NEURONTIN) 300 MG capsule Take 300 mg by mouth 2 (two) times daily.    [provider]  omeprazole (PRILOSEC) 20 MG capsule Take 20 mg by mouth daily.    [provider]  sildenafil (REVATIO) 20 MG tablet 2-5 tablets as needed 1 hour prior to intercourse 08/08/20   Hollice Espy, MD  [facility] HANDIHALER 18 MCG inhalation capsule 1 capsule daily. 08/08/20   [provider]  tamsulosin (FLOMAX) 0.4 MG CAPS capsule Take 1 capsule (0.4 mg total) by mouth daily. 04/26/20   Hollice Espy, MD    Allergies Patient has no known allergies.    Social History Social History   Tobacco Use   Smoking status: Former  Packs/day: 2.00    Years: 49.00    Pack years: 98.00    Types: Cigarettes    Quit date: 07/11/2020    Years since quitting: 0.3   Smokeless tobacco: Never  Substance Use Topics   Alcohol use: Yes    Alcohol/week: 36.0 standard drinks    Types: 36 Cans of beer per week   Drug use: Not Currently    Review of Systems Patient denies headaches, rhinorrhea, blurry vision, numbness, shortness of breath, chest pain, edema, cough, abdominal pain, nausea, vomiting, diarrhea, dysuria, fevers, rashes or hallucinations  unless otherwise stated above in HPI. ____________________________________________   PHYSICAL EXAM:  VITAL SIGNS: Vitals:   11/18/20 1354  BP: 111/73  Pulse: 78  Resp: 18  Temp: 98 F (36.7 C)  SpO2: 97%    Constitutional: Alert and oriented.  Eyes: Conjunctivae are normal.  Head: Atraumatic. Nose: No congestion/rhinnorhea. Mouth/Throat: Mucous membranes are moist.   Neck: No stridor. Painless ROM.  Cardiovascular: Normal rate, regular rhythm. Grossly normal heart sounds.  Good peripheral circulation. Respiratory: Normal respiratory effort.  No retractions. Lungs CTAB. Gastrointestinal: Soft  with ttp in LLQ.  No guarding or rebound. No distention. No abdominal bruits. No CVA tenderness. Genitourinary:  Musculoskeletal: No lower extremity tenderness nor edema.  No joint effusions. Neurologic:  Normal speech and language. No gross focal neurologic deficits are appreciated. No facial droop Skin:  Skin is warm, dry and intact. No rash noted. Psychiatric: Mood and affect are normal. Speech and behavior are normal.  ____________________________________________   LABS (all labs ordered are listed, but only abnormal results are displayed)  Results for orders placed or performed during the hospital encounter of 11/18/20 (from the past 24 hour(s))  Lipase, blood     Status: None   Collection Time: 11/18/20  2:02 PM  Result Value Ref Range   Lipase 30 11 - 51 U/L  Comprehensive metabolic panel     Status: Abnormal   Collection Time: 11/18/20  2:02 PM  Result Value Ref Range   Sodium 135 135 - 145 mmol/L   Potassium 3.5 3.5 - 5.1 mmol/L   Chloride 105 98 - 111 mmol/L   CO2 24 22 - 32 mmol/L   Glucose, Bld 149 (H) 70 - 99 mg/dL   BUN 10 8 - 23 mg/dL   Creatinine, Ser 0.83 0.61 - 1.24 mg/dL   Calcium 8.6 (L) 8.9 - 10.3 mg/dL   Total Protein 6.5 6.5 - 8.1 g/dL   Albumin 3.3 (L) 3.5 - 5.0 g/dL   AST 24 15 - 41 U/L   ALT 25 0 - 44 U/L   Alkaline Phosphatase 60 38 - 126 U/L    Total Bilirubin 0.9 0.3 - 1.2 mg/dL   GFR, Estimated >60 >60 mL/min   Anion gap 6 5 - 15  CBC     Status: Abnormal   Collection Time: 11/18/20  2:02 PM  Result Value Ref Range   WBC 10.7 (H) 4.0 - 10.5 K/uL   RBC 4.42 4.22 - 5.81 MIL/uL   Hemoglobin 14.4 13.0 - 17.0 g/dL   HCT 41.3 39.0 - 52.0 %   MCV 93.4 80.0 - 100.0 fL   MCH 32.6 26.0 - 34.0 pg   MCHC 34.9 30.0 - 36.0 g/dL   RDW 11.7 11.5 - 15.5 %   Platelets 328 150 - 400 K/uL   nRBC 0.0 0.0 - 0.2 %  Urinalysis, Complete w Microscopic     Status: Abnormal   Collection Time:  11/18/20  2:02 PM  Result Value Ref Range   Color, Urine YELLOW (A) YELLOW   APPearance CLEAR (A) CLEAR   Specific Gravity, Urine 1.010 1.005 - 1.030   pH 7.0 5.0 - 8.0   Glucose, UA NEGATIVE NEGATIVE mg/dL   Hgb urine dipstick SMALL (A) NEGATIVE   Bilirubin Urine NEGATIVE NEGATIVE   Ketones, ur NEGATIVE NEGATIVE mg/dL   Protein, ur NEGATIVE NEGATIVE mg/dL   Nitrite NEGATIVE NEGATIVE   Leukocytes,Ua NEGATIVE NEGATIVE   RBC / HPF 0-5 0 - 5 RBC/hpf   WBC, UA 0-5 0 - 5 WBC/hpf   Bacteria, UA NONE SEEN NONE SEEN   Squamous Epithelial / LPF 0-5 0 - 5   Mucus PRESENT    ____________________________________________ ____________________________________________  RADIOLOGY  I personally reviewed all radiographic images ordered to evaluate for the above acute complaints and reviewed radiology reports and findings.  These findings were personally discussed with the patient.  Please see medical record for radiology report.  ____________________________________________   PROCEDURES  Procedure(s) performed:  Procedures    Critical Care performed: no ____________________________________________   INITIAL IMPRESSION / ASSESSMENT AND PLAN / ED COURSE  Pertinent labs & imaging results that were available during my care of the patient were reviewed by me and considered in my medical decision making (see chart for details).   DDX: Diverticulitis,  perforation, abscess, colitis, ischemia, stone, [place], hernia, musculoskeletal strain  Aaliyah Cancro is a 70 y.o. who presents to the ED with presentation as described above.  Patient nontoxic-appearing afebrile hemodynamically stable but does have tenderness to palpation of left lower quadrant.  Will order CT imaging given history of diverticula and location of pain.  Will give IV pain medication and reassess.  Clinical Course as of 11/18/20 1739  Sat Nov 18, 2020  1636 Patient with evidence of acute uncomplicated diverticulitis by CT imaging.  Mild leukocytosis but no other sirs criteria.  No will trial p.o. antibiotic course reassess if he is able to tolerate p.o. and pain improved I do think he be appropriate for outpatient follow-up.  Patient agreeable plan.  Will reassess. [PR]  1739 Patient feels significantly improved.  Tolerating p.o.  Is appropriate for trial of outpatient management.  Discussed strict return precautions. [PR]    Clinical Course User Index [PR] Merlyn Lot, MD    The patient was evaluated in Emergency Department today for the symptoms described in the history of present illness. He/she was evaluated in the context of the global COVID-19 pandemic, which necessitated consideration that the patient might be at risk for infection with the SARS-CoV-2 virus that causes COVID-19. Institutional protocols and algorithms that pertain to the evaluation of patients at risk for COVID-19 are in a state of rapid change based on information released by regulatory bodies including the CDC and federal and state organizations. These policies and algorithms were followed during the patient's care in the ED.  As part of my medical decision making, I reviewed the following data within the [place] notes reviewed and incorporated, Labs reviewed, notes from prior ED visits and [place] Controlled Substance  Database   ____________________________________________   FINAL CLINICAL IMPRESSION(S) / ED DIAGNOSES  Final diagnoses:  Acute diverticulitis of intestine      NEW MEDICATIONS STARTED DURING THIS VISIT:  New Prescriptions   AMOXICILLIN-CLAVULANATE (AUGMENTIN) 875-125 MG TABLET    Take 1 tablet by mouth 2 (two) times daily for 7 days.   HYDROCODONE-ACETAMINOPHEN (NORCO) 5-325 MG TABLET  Take 1 tablet by mouth every 4 (four) hours as needed for moderate pain.   ONDANSETRON (ZOFRAN) 4 MG TABLET    Take 1 tablet (4 mg total) by mouth daily as needed.     Note:  This document was prepared using Dragon voice recognition software and may include unintentional dictation errors.    Merlyn Lot, MD 11/18/20 1739

## 2020-11-24 ENCOUNTER — Telehealth: Payer: Self-pay

## 2020-11-24 NOTE — Telephone Encounter (Signed)
Called patient to review information on [employer] and initiating benefits investigation. Patient will need to come to the office and sign the benefits form and then will fax to [employer]. Patient states he will come to the office Monday to sign the form  Per Dr. Erlene Quan: next visit note needs a PSA drawn.

## 2020-12-05 ENCOUNTER — Telehealth: Payer: Self-pay | Admitting: Urology

## 2020-12-05 NOTE — Progress Notes (Signed)
12/06/2020 1:39 PM   Rodney Howe Dec 02, 1950 [id number]  Referring provider: [facility] [address],  [place] [address]  Urological history: 1. BPH with LU TS -cysto -enlarged prostate bilobar coaptation with a relatively short prostatic length 06/2020 -TRUS 25 cc 06/2020 -I PSS 25/4 -PVR 76 mL  2. Peyronie's disease -left mid shaft plaque palpated  3. ED -contributing factors of age, alcohol abuse, smoking, HLD, COPD and neuropathy -managed with sildenafil 20 mg, on-demand-dosing  Chief Complaint  Patient presents with   Benign Prostatic Hypertrophy    HPI: Rodney Howe is a 70 y.o. male who presents today with the complaint of frequency and bloating.    He states he is having excessive daytime frequency, urgency, weak urinary stream and lower abdominal pain.  He also has nocturia x3.  Patient denies any modifying or aggravating factors.  Patient denies any gross hematuria, dysuria or suprapubic/flank pain.  Patient denies any fevers, chills, nausea or vomiting.    He is consuming 5 cups of coffee in the morning and 3 cocktails which contain cherry vodka and either ginger ale or 7-Up with lots of ice daily.  He also states that sometimes he wakes up at night and drinks milk and eats cookies because he is hungry.  PVR 76 mL.     IPSS     Row Name 12/06/20 0800         International Prostate Symptom Score   How often have you had the sensation of not emptying your bladder? More than half the time     How often have you had to urinate less than every two hours? More than half the time     How often have you found you stopped and started again several times when you urinated? Less than 1 in 5 times     How often have you found it difficult to postpone urination? About half the time     How often have you had a weak urinary stream? Almost always     How often have you had to strain to start urination? More than half  the time     How many times did you typically get up at night to urinate? 4 Times     Total IPSS Score 25           Quality of Life due to urinary symptoms     If you were to spend the rest of your life with your urinary condition just the way it is now how would you feel about that? Mostly Disatisfied             Score:  1-7 Mild 8-19 Moderate 20-35 Severe    He is still having issues with erectile dysfunction.  He states he is only tried one of the 20 mg tablets of the sildenafil and it was not effective.  PMH: Past Medical History:  Diagnosis Date   COPD (chronic obstructive pulmonary disease) ([place])     Surgical History: Past Surgical History:  Procedure Laterality Date   ANKLE SURGERY     COLONOSCOPY WITH PROPOFOL N/A 09/19/2020   Procedure: COLONOSCOPY WITH PROPOFOL;  Surgeon: Jonathon Bellows, MD;  Location: [facility] ENDOSCOPY;  Service: Gastroenterology;  Laterality: N/A;    Home Medications:  Allergies as of 12/06/2020   No Known Allergies      Medication List        Accurate as of December 06, 2020  1:39 PM. If  you have any questions, ask your nurse or doctor.          albuterol 108 (90 Base) MCG/ACT inhaler Commonly known as: Ventolin HFA Inhale 2 puffs into the lungs every 6 (six) hours as needed for wheezing or shortness of breath.   amitriptyline 25 MG tablet Commonly known as: ELAVIL Take by mouth.   aspirin EC 81 MG tablet Take 81 mg by mouth daily.   atorvastatin 40 MG tablet Commonly known as: LIPITOR Take 40 mg by mouth every evening.   budesonide-formoterol 80-4.5 MCG/ACT inhaler Commonly known as: SYMBICORT Inhale 2 puffs into the lungs 2 (two) times daily.   finasteride 5 MG tablet Commonly known as: PROSCAR Take 1 tablet (5 mg total) by mouth daily.   gabapentin 300 MG capsule Commonly known as: NEURONTIN Take 300 mg by mouth 2 (two) times daily.   HYDROcodone-acetaminophen 5-325 MG tablet Commonly known as: Norco Take 1 tablet by  mouth every 4 (four) hours as needed for moderate pain.   omeprazole 20 MG capsule Commonly known as: PRILOSEC Take 20 mg by mouth daily.   ondansetron 4 MG tablet Commonly known as: Zofran Take 1 tablet (4 mg total) by mouth daily as needed.   sildenafil 20 MG tablet Commonly known as: REVATIO 2-5 tablets as needed 1 hour prior to intercourse   Spiriva HandiHaler 18 MCG inhalation capsule Generic drug: tiotropium 1 capsule daily.   tamsulosin 0.4 MG Caps capsule Commonly known as: Flomax Take 1 capsule (0.4 mg total) by mouth daily.        Allergies: No Known Allergies  Family History: No family history on file.  Social History:  reports that he quit smoking about 4 months ago. His smoking use included cigarettes. He has a 98.00 pack-year smoking history. He has never used smokeless tobacco. He reports current alcohol use of about 36.0 standard drinks of alcohol per week. He reports previous drug use.  ROS: Pertinent ROS in HPI  Physical Exam: BP 110/74   Pulse 75   Ht 5\' 6"  (1.676 m)   Wt 159 lb (72.1 kg)   BMI 25.66 kg/m   Constitutional:  Well nourished. Alert and oriented, No acute distress. HEENT: [place] AT, mask in place.  Trachea midline Cardiovascular: No clubbing, cyanosis, or edema. Respiratory: Normal respiratory effort, no increased work of breathing. GU: No CVA tenderness.  No bladder fullness or masses.  Patient with circumcised phallus.  Urethral meatus is patent.  No penile discharge. No penile lesions or rashes. Scrotum without lesions, cysts, rashes and/or edema.  Testicles are located scrotally bilaterally. No masses are appreciated in the testicles. Left and right epididymis are normal. Rectal: Patient with  normal sphincter tone. Anus and perineum without scarring or rashes. No rectal masses are appreciated. Prostate is approximately 45 grams, no nodules are appreciated. Seminal vesicles could not be palpated Neurologic: Grossly intact, no focal  deficits, moving all 4 extremities. Psychiatric: Normal mood and affect.  Laboratory Data: Lab Results  Component Value Date   WBC 10.7 (H) 11/18/2020   HGB 14.4 11/18/2020   HCT 41.3 11/18/2020   MCV 93.4 11/18/2020   PLT 328 11/18/2020    Lab Results  Component Value Date   CREATININE 0.83 11/18/2020   Lab Results  Component Value Date   TSH 3.094 11/24/2017    Lab Results  Component Value Date   AST 24 11/18/2020   Lab Results  Component Value Date   ALT 25 11/18/2020    Urinalysis  Component Value Date/Time   COLORURINE YELLOW (A) 11/18/2020 1402   APPEARANCEUR CLEAR (A) 11/18/2020 1402   APPEARANCEUR Clear 06/14/2020 1019   LABSPEC 1.010 11/18/2020 1402   PHURINE 7.0 11/18/2020 1402   GLUCOSEU NEGATIVE 11/18/2020 1402   HGBUR SMALL (A) 11/18/2020 1402   BILIRUBINUR NEGATIVE 11/18/2020 1402   BILIRUBINUR Negative 06/14/2020 1019   [place] 11/18/2020 1402   PROTEINUR NEGATIVE 11/18/2020 1402   NITRITE NEGATIVE 11/18/2020 1402   [place] 11/18/2020 1402  I have reviewed the labs.   Pertinent Imaging: Results for RYLEY, BACHTEL" (MRN [id number]) as of 12/06/2020 14:12  Ref. Range 12/06/2020 08:41  Scan Result Unknown 45mL   Assessment & Plan:    1. BPH with LU TS -Advised patient to eliminate or at least decrease his coffee consumption significantly -Advised patient to reduce his alcohol consumption -Advised patient to add more water to his fluid intake -Patient is continue tamsulosin 0.4 mg and finasteride 5 mg daily -We will reassess when he returns in 1 month  2. ED -Advised patient that he can take up to 5 tablets of the 20 mg sildenafil -He will try up to 5 tablets of the sildenafil to see if it improves his ED  No follow-ups on file.  These notes generated with voice recognition software. I apologize for typographical errors.  Zara Council, PA-C  [facility] [address]  [place], [place] [address] [phone]

## 2020-12-05 NOTE — Telephone Encounter (Signed)
Pt stopped by office to let us know he is doing worse since taking finasteride and flomax.  He would like to speak with someone about this.

## 2020-12-05 NOTE — Telephone Encounter (Signed)
Called patient-has been having increased urine frequency and "feeling bloated" Denies pain-burning or any other symptoms. Appointment made for an evaluation. Voiced understanding.

## 2020-12-06 ENCOUNTER — Encounter: Payer: Self-pay | Admitting: Urology

## 2020-12-06 ENCOUNTER — Ambulatory Visit (INDEPENDENT_AMBULATORY_CARE_PROVIDER_SITE_OTHER): Payer: Medicare Other | Admitting: Urology

## 2020-12-06 ENCOUNTER — Other Ambulatory Visit: Payer: Self-pay

## 2020-12-06 VITALS — BP 110/74 | HR 75 | Ht 66.0 in | Wt 159.0 lb

## 2020-12-06 DIAGNOSIS — R3912 Poor urinary stream: Secondary | ICD-10-CM | POA: Diagnosis not present

## 2020-12-06 DIAGNOSIS — N401 Enlarged prostate with lower urinary tract symptoms: Secondary | ICD-10-CM | POA: Diagnosis not present

## 2020-12-06 LAB — BLADDER SCAN AMB NON-IMAGING

## 2020-12-11 NOTE — Telephone Encounter (Signed)
Benefits form was signed and faxed to [employer], awaiting response

## 2020-12-14 NOTE — Telephone Encounter (Signed)
Spoke w/Lilliana from [employer] called and confirmed consent from patient. Delivery confirmation was given and scheduled for 12-19-20. Once medication is received will contact patient for injection appointments

## 2020-12-14 NOTE — Telephone Encounter (Signed)
Update from [employer] medication PA was approved, will contact patient for consent. Once received will then schedule medication delivery confirmation. Awaiting call back

## 2020-12-20 NOTE — Telephone Encounter (Signed)
Medication received, left patient a message to schedule injection appt

## 2020-12-20 NOTE — Telephone Encounter (Signed)
Shipment delayed should deliver today @10 :30 tracking number [employer] #[id number]

## 2020-12-21 NOTE — Telephone Encounter (Signed)
Called patient no answer unable to leave message 

## 2020-12-28 ENCOUNTER — Encounter: Payer: Self-pay | Admitting: Emergency Medicine

## 2020-12-28 ENCOUNTER — Emergency Department: Payer: Medicare Other

## 2020-12-28 ENCOUNTER — Other Ambulatory Visit: Payer: Self-pay

## 2020-12-28 ENCOUNTER — Emergency Department
Admission: EM | Admit: 2020-12-28 | Discharge: 2020-12-28 | Disposition: A | Discharge status: Home / Self Care | Payer: Medicare Other | Attending: Emergency Medicine | Admitting: Emergency Medicine

## 2020-12-28 DIAGNOSIS — H1032 Unspecified acute conjunctivitis, left eye: Secondary | ICD-10-CM | POA: Insufficient documentation

## 2020-12-28 DIAGNOSIS — J449 Chronic obstructive pulmonary disease, unspecified: Secondary | ICD-10-CM | POA: Insufficient documentation

## 2020-12-28 DIAGNOSIS — Z87891 Personal history of nicotine dependence: Secondary | ICD-10-CM | POA: Insufficient documentation

## 2020-12-28 DIAGNOSIS — M79604 Pain in right leg: Secondary | ICD-10-CM

## 2020-12-28 DIAGNOSIS — Z7951 Long term (current) use of inhaled steroids: Secondary | ICD-10-CM | POA: Insufficient documentation

## 2020-12-28 DIAGNOSIS — L03115 Cellulitis of right lower limb: Secondary | ICD-10-CM | POA: Diagnosis not present

## 2020-12-28 DIAGNOSIS — Z7982 Long term (current) use of aspirin: Secondary | ICD-10-CM | POA: Diagnosis not present

## 2020-12-28 LAB — CBC WITH DIFFERENTIAL/PLATELET
Abs Immature Granulocytes: 0.06 10*3/uL (ref 0.00–0.07)
Basophils Absolute: 0.1 10*3/uL (ref 0.0–0.1)
Basophils Relative: 1 %
Eosinophils Absolute: 0.1 10*3/uL (ref 0.0–0.5)
Eosinophils Relative: 1 %
HCT: 39.3 % (ref 39.0–52.0)
Hemoglobin: 13.7 g/dL (ref 13.0–17.0)
Immature Granulocytes: 1 %
Lymphocytes Relative: 22 %
Lymphs Abs: 2.4 10*3/uL (ref 0.7–4.0)
MCH: 32.8 pg (ref 26.0–34.0)
MCHC: 34.9 g/dL (ref 30.0–36.0)
MCV: 94 fL (ref 80.0–100.0)
Monocytes Absolute: 1.4 10*3/uL — ABNORMAL HIGH (ref 0.1–1.0)
Monocytes Relative: 13 %
Neutro Abs: 6.8 10*3/uL (ref 1.7–7.7)
Neutrophils Relative %: 62 %
Platelets: 268 10*3/uL (ref 150–400)
RBC: 4.18 MIL/uL — ABNORMAL LOW (ref 4.22–5.81)
RDW: 11.9 % (ref 11.5–15.5)
WBC: 10.9 10*3/uL — ABNORMAL HIGH (ref 4.0–10.5)
nRBC: 0 % (ref 0.0–0.2)

## 2020-12-28 LAB — COMPREHENSIVE METABOLIC PANEL
ALT: 30 U/L (ref 0–44)
AST: 29 U/L (ref 15–41)
Albumin: 3.8 g/dL (ref 3.5–5.0)
Alkaline Phosphatase: 66 U/L (ref 38–126)
Anion gap: 12 (ref 5–15)
BUN: 12 mg/dL (ref 8–23)
CO2: 24 mmol/L (ref 22–32)
Calcium: 8.9 mg/dL (ref 8.9–10.3)
Chloride: 99 mmol/L (ref 98–111)
Creatinine, Ser: 1.11 mg/dL (ref 0.61–1.24)
GFR, Estimated: >60 mL/min (ref >60)
Glucose, Bld: 107 mg/dL — ABNORMAL HIGH (ref 70–99)
Potassium: 3.6 mmol/L (ref 3.5–5.1)
Sodium: 135 mmol/L (ref 135–145)
Total Bilirubin: 1 mg/dL (ref 0.3–1.2)
Total Protein: 7.3 g/dL (ref 6.5–8.1)

## 2020-12-28 MED ORDER — OXYCODONE HCL 5 MG PO TABS: 5.0000 mg | ORAL_TABLET | Freq: Three times a day (TID) | ORAL | 0 refills | Status: DC | PRN

## 2020-12-28 MED ORDER — OXYCODONE HCL 5 MG PO TABS
5.0000 mg | ORAL_TABLET | Freq: Once | ORAL | Status: AC
  Administered 2020-12-28: 5 mg via ORAL
  Filled 2020-12-28: qty 1

## 2020-12-28 MED ORDER — ACETAMINOPHEN 500 MG PO TABS
1000.0000 mg | ORAL_TABLET | Freq: Once | ORAL | Status: AC
  Administered 2020-12-28: 1000 mg via ORAL
  Filled 2020-12-28: qty 2

## 2020-12-28 NOTE — ED Triage Notes (Signed)
Right leg redness from foot to groin.  Has been on antibiotic for 2 days now.  Today he woke up with drainage from left eye that stuck his eye shut.

## 2020-12-28 NOTE — ED Provider Notes (Signed)
[name] Emergency Department Provider Note ____________________________________________   Event Date/Time   First MD Initiated Contact with Patient 12/28/20 1034     (approximate)  I have reviewed the triage vital signs and the nursing notes.  HISTORY  Chief Complaint Leg Pain and Eye Drainage   HPI Rodney Howe is a 70 y.o. malewho presents to the ED for evaluation of leg pain.  Chart review indicates hx COPD, quitting smoking about 5 months ago.  Patient reports that he was prescribed Bactrim DS and ciprofloxacin eyedrops 2 days ago due to left eye conjunctivitis and right leg redness and swelling/pain.  He reports his eyes seem to be doing better with the eyedrops and he reports consistent upper respiratory congestion and clear rhinorrhea that of been persistent for 4 to 5 days now.  Denies fevers, sore throat, difficulty breathing, headache or syncopal episodes.  He reports primary concern for right leg erythema, swelling and pain.  He reports he had a "pimple" to his proximal anterior tibia, just inferior to his knee.  He reports popping this pimple, developing erythema, swelling or redness which is why he saw his doctor a few days ago and was prescribed Bactrim.  He reports compliance with Bactrim, but despite this he has had persistent pain to his right leg.  He reports it seems about the same, without significant improvement or worsening.  He denies fevers, trauma, difficulty ambulating, chest pain, shortness of breath, syncopal episodes, hemoptysis, history of DVT.  Past Medical History:  Diagnosis Date   COPD (chronic obstructive pulmonary disease) ([place])     Patient Active Problem List   Diagnosis Date Noted   Sepsis ([place]) 11/16/2017   Hyperlipidemia 06/24/2013   FHx: colon cancer 05/19/2013   Heartburn 05/19/2013   Tobacco abuse 05/19/2013    Past Surgical History:  Procedure Laterality Date   ANKLE SURGERY     COLONOSCOPY  WITH PROPOFOL N/A 09/19/2020   Procedure: COLONOSCOPY WITH PROPOFOL;  Surgeon: Jonathon Bellows, MD;  Location: [facility] ENDOSCOPY;  Service: Gastroenterology;  Laterality: N/A;    Prior to Admission medications   Medication Sig Start Date End Date Taking? Authorizing Provider  oxyCODONE (ROXICODONE) 5 MG immediate release tablet Take 1 tablet (5 mg total) by mouth every 8 (eight) hours as needed. 12/28/20 12/28/21 Yes Vladimir Crofts, MD  albuterol (VENTOLIN HFA) 108 (90 Base) MCG/ACT inhaler Inhale 2 puffs into the lungs every 6 (six) hours as needed for wheezing or shortness of breath. 11/17/17   Henreitta Leber, MD  amitriptyline (ELAVIL) 25 MG tablet Take by mouth. 02/05/12   [provider]  aspirin EC 81 MG tablet Take 81 mg by mouth daily.    [provider]  atorvastatin (LIPITOR) 40 MG tablet Take 40 mg by mouth every evening.    [provider]  budesonide-formoterol (SYMBICORT) 80-4.5 MCG/ACT inhaler Inhale 2 puffs into the lungs 2 (two) times daily. 11/17/17   Henreitta Leber, MD  finasteride (PROSCAR) 5 MG tablet Take 1 tablet (5 mg total) by mouth daily. 04/26/20   Hollice Espy, MD  gabapentin (NEURONTIN) 300 MG capsule Take 300 mg by mouth 2 (two) times daily.    [provider]  HYDROcodone-acetaminophen (NORCO) 5-325 MG tablet Take 1 tablet by mouth every 4 (four) hours as needed for moderate pain. 11/18/20   Merlyn Lot, MD  omeprazole (PRILOSEC) 20 MG capsule Take 20 mg by mouth daily.    [provider]  ondansetron (ZOFRAN) 4 MG  tablet Take 1 tablet (4 mg total) by mouth daily as needed. 11/18/20 11/18/21  Merlyn Lot, MD  sildenafil (REVATIO) 20 MG tablet 2-5 tablets as needed 1 hour prior to intercourse 08/08/20   Hollice Espy, MD  [facility] HANDIHALER 18 MCG inhalation capsule 1 capsule daily. 08/08/20   [provider]  tamsulosin (FLOMAX) 0.4 MG CAPS capsule Take 1 capsule (0.4 mg total) by mouth daily. 04/26/20   Hollice Espy, MD    Allergies Patient has no known allergies.  No family history on file.  Social History Social History   Tobacco Use   Smoking status: Former    Packs/day: 2.00    Years: 49.00    Pack years: 98.00    Types: Cigarettes    Quit date: 07/11/2020    Years since quitting: 0.4   Smokeless tobacco: Never  Substance Use Topics   Alcohol use: Not Currently    Alcohol/week: 36.0 standard drinks    Types: 36 Cans of beer per week   Drug use: Not Currently    Review of Systems  Constitutional: No fever/chills Eyes: No visual changes.  Positive for left conjunctival discharge, improving ENT: No sore throat. Cardiovascular: Denies chest pain. Respiratory: Denies shortness of breath. Gastrointestinal: No abdominal pain.  No nausea, no vomiting.  No diarrhea.  No constipation. Genitourinary: Negative for dysuria. Musculoskeletal: Negative for back pain. Positive for atraumatic right leg swelling and pain. Skin: Negative for rash. Neurological: Negative for headaches, focal weakness or numbness.  ____________________________________________   PHYSICAL EXAM:  VITAL SIGNS: Vitals:   12/28/20 0903  BP: (!) 116/104  Pulse: 86  Resp: 16  Temp: 98.3 F (36.8 C)  SpO2: 96%     Constitutional: Alert and oriented. Well appearing and in no acute distress. Eyes:  PERRL. EOMI. left-sided conjunctive eye are injected with a small amount of crusting around his left eye.  No orbital step-offs, proptosis or periorbital edema Head: Atraumatic. Nose: No congestion/rhinnorhea. Mouth/Throat: Mucous membranes are moist.  Oropharynx non-erythematous. Neck: No stridor. No cervical spine tenderness to palpation. Cardiovascular: Normal rate, regular rhythm. Grossly normal heart sounds.  Good peripheral circulation. Respiratory: Normal respiratory effort.  No retractions. Lungs CTAB. Gastrointestinal: Soft , nondistended, nontender to palpation. No CVA tenderness. Musculoskeletal:   No joint effusions. No signs of acute trauma. Right leg erythema, swelling and pain, primarily to his proximal shin and tibia.  Some associated tenderness to palpation.  Calf and thigh are soft without significant tenderness.  Palpable DP pulses bilaterally and capillary refill symmetric to the left. No erythema or skin changes or significant tenderness noted to the thigh on the right Neurologic:  Normal speech and language. No gross focal neurologic deficits are appreciated. No gait instability noted. Skin:  Skin is warm, dry and intact. No rash noted. Psychiatric: Mood and affect are normal. Speech and behavior are normal.  ____________________________________________   LABS (all labs ordered are listed, but only abnormal results are displayed)  Labs Reviewed  COMPREHENSIVE METABOLIC PANEL - Abnormal; Notable for the following components:      Result Value   Glucose, Bld 107 (*)    All other components within normal limits  CBC WITH DIFFERENTIAL/PLATELET - Abnormal; Notable for the following components:   WBC 10.9 (*)    RBC 4.18 (*)    Monocytes Absolute 1.4 (*)    All other components within normal limits   ____________________________________________  12 Lead EKG   ____________________________________________  RADIOLOGY  ED MD interpretation:  Official radiology report(s): US Venous Img Lower Unilateral Right  Result Date: 12/28/2020 CLINICAL DATA:  Pain and swelling with redness for 4 days EXAM: RIGHT LOWER EXTREMITY VENOUS DOPPLER ULTRASOUND TECHNIQUE: Gray-scale sonography with graded compression, as well as color Doppler and duplex ultrasound were performed to evaluate the lower extremity deep venous systems from the level of the common femoral vein and including the common femoral, femoral, profunda femoral, popliteal and calf veins including the posterior tibial, peroneal and gastrocnemius veins when visible. The superficial great saphenous vein was also  interrogated. Spectral Doppler was utilized to evaluate flow at rest and with distal augmentation maneuvers in the common femoral, femoral and popliteal veins. COMPARISON:  None. FINDINGS: Contralateral Common Femoral Vein: Respiratory phasicity is normal and symmetric with the symptomatic side. No evidence of thrombus. Normal compressibility. Common Femoral Vein: No evidence of thrombus. Normal compressibility, respiratory phasicity and response to augmentation. Saphenofemoral Junction: No evidence of thrombus. Normal compressibility and flow on color Doppler imaging. Profunda Femoral Vein: No evidence of thrombus. Normal compressibility and flow on color Doppler imaging. Femoral Vein: No evidence of thrombus. Normal compressibility, respiratory phasicity and response to augmentation. Popliteal Vein: No evidence of thrombus. Normal compressibility, respiratory phasicity and response to augmentation. Calf Veins: No evidence of thrombus. Normal compressibility and flow on color Doppler imaging. IMPRESSION: No evidence of deep venous thrombosis. Electronically Signed   By: Jerilynn Mages.  Shick M.D.   On: 12/28/2020 11:57    ____________________________________________   PROCEDURES and INTERVENTIONS  Procedure(s) performed (including Critical Care):  Procedures  Medications  acetaminophen (TYLENOL) tablet 1,000 mg (1,000 mg Oral Given 12/28/20 1119)  oxyCODONE (Oxy IR/ROXICODONE) immediate release tablet 5 mg (5 mg Oral Given 12/28/20 1119)    ____________________________________________   MDM / ED COURSE   70 year old male presents to the ED with right leg pain and swelling, consistent with symptomatic cellulitis, amenable to outpatient management.  Normal vitals on room air.  Exam with flat erythema, without evidence of overlying trauma, neurologic or vascular deficits, consistent with cellulitis.  DVT ultrasound is reassuring.  Blood work with minimal leukocytosis, otherwise unremarkable.  He was restarted  on Bactrim, which I think is reasonable to continue to treat his cellulitis.  No evidence of arterial occlusion, DVT or indications for additional diagnostics here in the ED.  Will discharge with symptomatic measures and follow-up precautions.   Clinical Course as of 12/28/20 1217  Thu Dec 28, 2020  1213 Reassessed.  Patient reports feeling better.  We discussed reassuring ultrasound and high likelihood of continued symptoms of cellulitis.  Patient is requesting opiate analgesia.  We discussed prescription for only a few tablets, and we discussed the importance of prioritizing multimodal nonnarcotic analgesia.  We discussed following up as an outpatient and we discussed return precautions for the ED. [DS]    Clinical Course User Index [DS] Vladimir Crofts, MD    ____________________________________________   FINAL CLINICAL IMPRESSION(S) / ED DIAGNOSES  Final diagnoses:  Right leg pain  Acute bacterial conjunctivitis of left eye  Cellulitis of right lower extremity     ED Discharge Orders          Ordered    oxyCODONE (ROXICODONE) 5 MG immediate release tablet  Every 8 hours PRN        12/28/20 1216             Makai Agostinelli   Note:  This document was prepared using Systems analyst and may include unintentional dictation errors.  Vladimir Crofts, MD 12/28/20 [phone]

## 2020-12-28 NOTE — Discharge Instructions (Addendum)
Keep taking your antibiotic pills and finish that whole bottle as prescribed.  Use Tylenol for pain and fevers.  Up to 1000 mg per dose, up to 4 times per day.  Do not take more than 4000 mg of Tylenol/acetaminophen within 24 hours..  Use naproxen/Aleve for anti-inflammatory pain relief. Use up to '500mg'$  every 12 hours. Do not take more frequently than this. Do not use other NSAIDs (ibuprofen, Advil) while taking this medication. It is safe to take Tylenol with this.   Continue your other prescribed medications, including your gabapentin chronic pain medicine.  If you have severe pain on top of all of that, then use the oxycodone tablets that I have prescribed.  Do not drive while taking this medication.  If you develop any further worsening symptoms despite all of these measures, please return to the ED.

## 2021-01-02 NOTE — Progress Notes (Deleted)
01/03/2021 12:00 PM   Rodney Howe 12-Oct-1950 [id number]  Referring provider: [facility], [facility] [address],  [place] [address]  Urological history: 1. BPH with LU TS -cysto -enlarged prostate bilobar coaptation with a relatively short prostatic length 06/2020 -TRUS 25 cc 06/2020 -I PSS *** -PVR ***  2. Peyronie's disease -left mid shaft plaque palpated  3. ED -contributing factors of age, alcohol abuse, smoking, HLD, COPD and neuropathy -SHIM *** -managed with sildenafil 20 mg, on-demand-dosing  No chief complaint on file.   HPI: Rodney Howe is a 70 y.o. male who presents today for one month follow up.    At his last appointment, he was advised to continue the tamsulosin 0.4 mg and finasteride 5 mg daily and decrease his caffeine and alcohol consumption.       Score:  1-7 Mild 8-19 Moderate 20-35 Severe    At his last appointment, he was advised to take 5 tablets of sildenafil 20 mg prior to intercourse.      Score: 1-7 Severe ED 8-11 Moderate ED 12-16 Mild-Moderate ED 17-21 Mild ED 22-25 No ED   PMH: Past Medical History:  Diagnosis Date   COPD (chronic obstructive pulmonary disease) ([place])     Surgical History: Past Surgical History:  Procedure Laterality Date   ANKLE SURGERY     COLONOSCOPY WITH PROPOFOL N/A 09/19/2020   Procedure: COLONOSCOPY WITH PROPOFOL;  Surgeon: Jonathon Bellows, MD;  Location: [facility] ENDOSCOPY;  Service: Gastroenterology;  Laterality: N/A;    Home Medications:  Allergies as of 01/03/2021   No Known Allergies      Medication List        Accurate as of January 02, 2021 12:00 PM. If you have any questions, ask your nurse or doctor.          albuterol 108 (90 Base) MCG/ACT inhaler Commonly known as: Ventolin HFA Inhale 2 puffs into the lungs every 6 (six) hours as needed for wheezing or shortness of breath.   amitriptyline 25 MG tablet Commonly known as: ELAVIL Take by  mouth.   aspirin EC 81 MG tablet Take 81 mg by mouth daily.   atorvastatin 40 MG tablet Commonly known as: LIPITOR Take 40 mg by mouth every evening.   budesonide-formoterol 80-4.5 MCG/ACT inhaler Commonly known as: SYMBICORT Inhale 2 puffs into the lungs 2 (two) times daily.   finasteride 5 MG tablet Commonly known as: PROSCAR Take 1 tablet (5 mg total) by mouth daily.   gabapentin 300 MG capsule Commonly known as: NEURONTIN Take 300 mg by mouth 2 (two) times daily.   HYDROcodone-acetaminophen 5-325 MG tablet Commonly known as: Norco Take 1 tablet by mouth every 4 (four) hours as needed for moderate pain.   omeprazole 20 MG capsule Commonly known as: PRILOSEC Take 20 mg by mouth daily.   ondansetron 4 MG tablet Commonly known as: Zofran Take 1 tablet (4 mg total) by mouth daily as needed.   oxyCODONE 5 MG immediate release tablet Commonly known as: Roxicodone Take 1 tablet (5 mg total) by mouth every 8 (eight) hours as needed.   sildenafil 20 MG tablet Commonly known as: REVATIO 2-5 tablets as needed 1 hour prior to intercourse   Spiriva HandiHaler 18 MCG inhalation capsule Generic drug: tiotropium 1 capsule daily.   tamsulosin 0.4 MG Caps capsule Commonly known as: Flomax Take 1 capsule (0.4 mg total) by mouth daily.        Allergies: No Known Allergies  Family  History: No family history on file.  Social History:  reports that he quit smoking about 5 months ago. His smoking use included cigarettes. He has a 98.00 pack-year smoking history. He has never used smokeless tobacco. He reports previous alcohol use of about 36.0 standard drinks of alcohol per week. He reports previous drug use.  ROS: Pertinent ROS in HPI  Physical Exam: There were no vitals taken for this visit.  Constitutional:  Well nourished. Alert and oriented, No acute distress. HEENT: [place] AT, mask in place.  Trachea midline Cardiovascular: No clubbing, cyanosis, or  edema. Respiratory: Normal respiratory effort, no increased work of breathing. Lymph: No inguinal adenopathy. Neurologic: Grossly intact, no focal deficits, moving all 4 extremities. Psychiatric: Normal mood and affect.   Laboratory Data: Results for orders placed or performed during the hospital encounter of 12/28/20  Comprehensive metabolic panel  Result Value Ref Range   Sodium 135 135 - 145 mmol/L   Potassium 3.6 3.5 - 5.1 mmol/L   Chloride 99 98 - 111 mmol/L   CO2 24 22 - 32 mmol/L   Glucose, Bld 107 (H) 70 - 99 mg/dL   BUN 12 8 - 23 mg/dL   Creatinine, Ser 1.11 0.61 - 1.24 mg/dL   Calcium 8.9 8.9 - 10.3 mg/dL   Total Protein 7.3 6.5 - 8.1 g/dL   Albumin 3.8 3.5 - 5.0 g/dL   AST 29 15 - 41 U/L   ALT 30 0 - 44 U/L   Alkaline Phosphatase 66 38 - 126 U/L   Total Bilirubin 1.0 0.3 - 1.2 mg/dL   GFR, Estimated >60 >60 mL/min   Anion gap 12 5 - 15  CBC with Differential  Result Value Ref Range   WBC 10.9 (H) 4.0 - 10.5 K/uL   RBC 4.18 (L) 4.22 - 5.81 MIL/uL   Hemoglobin 13.7 13.0 - 17.0 g/dL   HCT 39.3 39.0 - 52.0 %   MCV 94.0 80.0 - 100.0 fL   MCH 32.8 26.0 - 34.0 pg   MCHC 34.9 30.0 - 36.0 g/dL   RDW 11.9 11.5 - 15.5 %   Platelets 268 150 - 400 K/uL   nRBC 0.0 0.0 - 0.2 %   Neutrophils Relative % 62 %   Neutro Abs 6.8 1.7 - 7.7 K/uL   Lymphocytes Relative 22 %   Lymphs Abs 2.4 0.7 - 4.0 K/uL   Monocytes Relative 13 %   Monocytes Absolute 1.4 (H) 0.1 - 1.0 K/uL   Eosinophils Relative 1 %   Eosinophils Absolute 0.1 0.0 - 0.5 K/uL   Basophils Relative 1 %   Basophils Absolute 0.1 0.0 - 0.1 K/uL   Immature Granulocytes 1 %   Abs Immature Granulocytes 0.06 0.00 - 0.07 K/uL  I have reviewed the labs.   Pertinent Imaging: ***  Assessment & Plan:    1. 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 ED -Advised patient that he can take up to 5 tablets of the 20 mg sildenafil -He will try up to 5 tablets of the sildenafil to see if it improves his ED  No follow-ups on file.  These notes generated with voice recognition software. I apologize for typographical errors.  Miki Blank, PA-C  [address] [place], [place] [address] [phone]

## 2021-01-03 ENCOUNTER — Ambulatory Visit: Payer: Self-pay | Admitting: Urology

## 2021-01-03 DIAGNOSIS — N529 Male erectile dysfunction, unspecified: Secondary | ICD-10-CM

## 2021-01-03 DIAGNOSIS — N401 Enlarged prostate with lower urinary tract symptoms: Secondary | ICD-10-CM

## 2021-01-04 ENCOUNTER — Encounter: Payer: Self-pay | Admitting: Urology

## 2021-01-05 NOTE — Telephone Encounter (Signed)
Called patient and was able to notify him that medication was received and he was scheduled for [facility] appointment series

## 2021-01-09 ENCOUNTER — Other Ambulatory Visit: Payer: Self-pay

## 2021-01-09 ENCOUNTER — Encounter: Payer: Self-pay | Admitting: Physician Assistant

## 2021-01-09 ENCOUNTER — Ambulatory Visit (INDEPENDENT_AMBULATORY_CARE_PROVIDER_SITE_OTHER): Payer: Medicare Other | Admitting: Urology

## 2021-01-09 ENCOUNTER — Ambulatory Visit (INDEPENDENT_AMBULATORY_CARE_PROVIDER_SITE_OTHER): Payer: Medicare Other | Admitting: Physician Assistant

## 2021-01-09 VITALS — BP 126/82 | HR 69 | Ht 66.0 in | Wt 159.0 lb

## 2021-01-09 VITALS — BP 117/76 | HR 80

## 2021-01-09 DIAGNOSIS — N486 Induration penis plastica: Secondary | ICD-10-CM | POA: Diagnosis not present

## 2021-01-09 MED ORDER — COLLAGENASE CLOSTRID HISTOLYT 0.9 MG IJ SOLR
0.9000 mg | Freq: Once | INTRAMUSCULAR | Status: AC
  Administered 2021-01-09: 0.9 mg via INTRAMUSCULAR

## 2021-01-09 NOTE — Progress Notes (Signed)
Procedure: Xiaflex injection   Previously marked penile plaque site  Remained visible with marking pen with mild bruising at the injection sight.  The plaque was easily palpable  . Xiaflex injection (0.58 mg) was then injected directly into the plaque at the point of maximal curvature at an angle after being warm to room temperature using a small TB needle.  He tolerated the procedure very well.    I,Kailey Littlejohn,acting as a Education administrator for Hollice Espy, MD.,have documented all relevant documentation on the behalf of Hollice Espy, MD,as directed by  Hollice Espy, MD while in the presence of Hollice Espy, MD.   I have reviewed the above documentation for accuracy and completeness, and I agree with the above.   Hollice Espy, MD

## 2021-01-09 NOTE — Progress Notes (Signed)
Xiaflex Induction & Marking  Caverject Injection #1  Due to Peyronie's disease patient is present today for a Caverject injection for induction and marking of his penile plaque.  Dose: 5 mcg  Location: right mid intracavernosal Lot: [id number] Exp: 11/01/2022  Patient tolerated well, no complications were noted.  Performed by: Debroah Loop, PA-C   Caverject Injection #2  Due to Peyronie's disease and insufficiently firm erection with first Caverject dose as above, patient is present today for a second Caverject injection for induction and marking of his penile plaque.  Dose: 5 mcg Location: left distal intracavernosal Lot: [id number] Exp: 03/03/2023  Patient tolerated well, no complications were noted.  Plaque Palpation and Marking  5-10 minutes after his second Caverject dose as above, patient had achieved sufficiently firm erection for marking. Penile plaque was palpated at the right lateral base, measuring approximately 3cm in length. Curvature measures approximately 50 degrees in the right inferior direction. Using a skin marker, I made a linear mark overlying the plaque with a crosshatch at the point of greatest curvature. Photos were taken and saved to the patient's chart for reference.  Follow up: RTC this afternoon for Xiaflex with Dr. Erlene Quan

## 2021-01-09 NOTE — Progress Notes (Signed)
Procedure: Xiaflex injection   Previously marked penile plaque site  Remained visible with marking pen.  The plaque was easily palpable on the right lateral shaft near the base. Xiaflex injection (0.58 mg) was then injected directly into the plaque at the point of maximal curvature at an angle after  to being warm to room temperature using a small TB needle.  He tolerated the procedure very well.   Hollice Espy, MD

## 2021-01-10 ENCOUNTER — Ambulatory Visit (INDEPENDENT_AMBULATORY_CARE_PROVIDER_SITE_OTHER): Payer: Medicare Other | Admitting: Urology

## 2021-01-10 VITALS — BP 112/77 | HR 92

## 2021-01-10 DIAGNOSIS — N486 Induration penis plastica: Secondary | ICD-10-CM | POA: Diagnosis not present

## 2021-01-10 MED ORDER — COLLAGENASE CLOSTRID HISTOLYT 0.9 MG IJ SOLR
0.9000 mg | Freq: Once | INTRAMUSCULAR | Status: AC
  Administered 2021-01-10: 0.9 mg via INTRAMUSCULAR

## 2021-01-12 ENCOUNTER — Ambulatory Visit (INDEPENDENT_AMBULATORY_CARE_PROVIDER_SITE_OTHER): Payer: Medicare Other | Admitting: Physician Assistant

## 2021-01-12 ENCOUNTER — Encounter: Payer: Self-pay | Admitting: Physician Assistant

## 2021-01-12 ENCOUNTER — Other Ambulatory Visit: Payer: Self-pay

## 2021-01-12 VITALS — BP 129/75 | HR 82 | Ht 66.0 in | Wt 159.0 lb

## 2021-01-12 DIAGNOSIS — N486 Induration penis plastica: Secondary | ICD-10-CM

## 2021-01-12 NOTE — Progress Notes (Signed)
Xiaflex modeling  Patient's Peyronie's plaque is identified and palpated on the right lateral base of the phallus. Wearing gloves, I grasped the plaque about 1 cm proximal and distal to the injection site, making sure to avoid direct pressure on the injection site.  Using the target plaque as a fulcrum point, I used both hands to apply firm, steady pressure to elongate and stretch the plaque.  The penis was bent opposite to the patient's penile curvature, with stretching to the point of moderate resistance. I held pressure for 30 seconds then released. After a 60 second rest period, I repeated the penile modeling technique for a total of 3 modeling attempts at 30 seconds for each attempt.  We discussed penile strengthening and bending exercises at home for the next 6 weeks; instruction sheet provided to the patient. He tolerated well with no concerns.   Return in about 6 weeks (around 02/23/2021) for Peyronie's disease follow-up with Dr. Erlene Quan.   I spent 12 minutes on the day of the encounter to include pre-visit record review, face-to-face time with the patient, and post-visit ordering of tests.

## 2021-02-27 NOTE — Progress Notes (Incomplete)
02/27/21 4:05 PM   Rodney Howe 05-02-1951 [id number]  Referring provider:  Center, [facility] [place],  [place] [address] No chief complaint on file.    HPI: Rodney Howe is a 70 y.o.male with a personal history of peyronie's disease and BPH who presents today for 6 week follow-up on peyronie's disease.   His last xiaflex injection was on 01/10/2021 and modeling on 01/12/2021.      PMH: Past Medical History:  Diagnosis Date   COPD (chronic obstructive pulmonary disease) ([employer])     Surgical History: Past Surgical History:  Procedure Laterality Date   ANKLE SURGERY     COLONOSCOPY WITH PROPOFOL N/A 09/19/2020   Procedure: COLONOSCOPY WITH PROPOFOL;  Surgeon: Jonathon Bellows, MD;  Location: [facility] ENDOSCOPY;  Service: Gastroenterology;  Laterality: N/A;    Home Medications:  Allergies as of 02/28/2021   No Known Allergies      Medication List        Accurate as of February 27, 2021  4:05 PM. If you have any questions, ask your nurse or doctor.          albuterol 108 (90 Base) MCG/ACT inhaler Commonly known as: Ventolin HFA Inhale 2 puffs into the lungs every 6 (six) hours as needed for wheezing or shortness of breath.   amitriptyline 25 MG tablet Commonly known as: ELAVIL Take by mouth.   aspirin EC 81 MG tablet Take 81 mg by mouth daily.   atorvastatin 40 MG tablet Commonly known as: LIPITOR Take 40 mg by mouth every evening.   budesonide-formoterol 80-4.5 MCG/ACT inhaler Commonly known as: SYMBICORT Inhale 2 puffs into the lungs 2 (two) times daily.   finasteride 5 MG tablet Commonly known as: PROSCAR Take 1 tablet (5 mg total) by mouth daily.   gabapentin 300 MG capsule Commonly known as: NEURONTIN Take 300 mg by mouth 2 (two) times daily.   omeprazole 20 MG capsule Commonly known as: PRILOSEC Take 20 mg by mouth daily.   sildenafil 20 MG tablet Commonly known as: REVATIO 2-5 tablets as needed 1  hour prior to intercourse   Spiriva HandiHaler 18 MCG inhalation capsule Generic drug: tiotropium 1 capsule daily.   tamsulosin 0.4 MG Caps capsule Commonly known as: Flomax Take 1 capsule (0.4 mg total) by mouth daily.   Xiaflex 0.9 MG Solr Generic drug: Collagenase Clostrid Histolyt        Allergies: No Known Allergies  Family History: No family history on file.  Social History:  reports that he quit smoking about 7 months ago. His smoking use included cigarettes. He has a 98.00 pack-year smoking history. He has never used smokeless tobacco. He reports that he does not currently use alcohol after a past usage of about 36.0 standard drinks per week. He reports that he does not currently use drugs.   Physical Exam: There were no vitals taken for this visit.  Constitutional:  Alert and oriented, No acute distress. HEENT: [place] AT, moist mucus membranes.  Trachea midline, no masses. Cardiovascular: No clubbing, cyanosis, or edema. Respiratory: Normal respiratory effort, no increased work of breathing. Skin: No rashes, bruises or suspicious lesions. Neurologic: Grossly intact, no focal deficits, moving all 4 extremities. Psychiatric: Normal mood and affect.  Laboratory Data:  Lab Results  Component Value Date   CREATININE 1.11 12/28/2020      Urinalysis   Pertinent Imaging:   Assessment & Plan:   Peyronie disease  BPH    No follow-ups on  file.  [name] as a scribe for Hollice Espy, MD.,have documented all relevant documentation on the behalf of Hollice Espy, MD,as directed by  Hollice Espy, MD while in the presence of Hollice Espy, [place] [address] [place], [place] [address] [phone]

## 2021-02-28 ENCOUNTER — Ambulatory Visit: Payer: Medicare Other | Admitting: Urology

## 2021-03-05 ENCOUNTER — Encounter: Payer: Self-pay | Admitting: Urology

## 2021-08-07 ENCOUNTER — Other Ambulatory Visit: Payer: Self-pay | Admitting: *Deleted

## 2021-08-07 DIAGNOSIS — F17210 Nicotine dependence, cigarettes, uncomplicated: Secondary | ICD-10-CM

## 2021-08-07 DIAGNOSIS — Z87891 Personal history of nicotine dependence: Secondary | ICD-10-CM

## 2021-08-21 ENCOUNTER — Ambulatory Visit
Admission: RE | Admit: 2021-08-21 | Discharge: 2021-08-21 | Disposition: A | Discharge status: Home / Self Care | Payer: Medicare Other | Source: Ambulatory Visit | Attending: Acute Care | Admitting: Acute Care

## 2021-08-21 DIAGNOSIS — F17210 Nicotine dependence, cigarettes, uncomplicated: Secondary | ICD-10-CM | POA: Insufficient documentation

## 2021-08-21 DIAGNOSIS — Z87891 Personal history of nicotine dependence: Secondary | ICD-10-CM | POA: Diagnosis present

## 2021-08-23 ENCOUNTER — Other Ambulatory Visit: Payer: Self-pay

## 2021-08-23 DIAGNOSIS — Z87891 Personal history of nicotine dependence: Secondary | ICD-10-CM

## 2021-08-23 DIAGNOSIS — F17210 Nicotine dependence, cigarettes, uncomplicated: Secondary | ICD-10-CM

## 2021-08-26 ENCOUNTER — Other Ambulatory Visit: Payer: Self-pay | Admitting: Urology

## 2021-11-02 ENCOUNTER — Other Ambulatory Visit: Payer: Self-pay

## 2021-11-02 MED ORDER — TAMSULOSIN HCL 0.4 MG PO CAPS: 0.4000 mg | ORAL_CAPSULE | Freq: Every day | ORAL | 0 refills | Status: DC

## 2022-03-14 ENCOUNTER — Institutional Professional Consult (permissible substitution): Payer: Medicare Other | Admitting: Plastic Surgery

## 2022-03-15 ENCOUNTER — Ambulatory Visit (INDEPENDENT_AMBULATORY_CARE_PROVIDER_SITE_OTHER): Payer: Medicare Other | Admitting: Plastic Surgery

## 2022-03-15 ENCOUNTER — Encounter: Payer: Self-pay | Admitting: Plastic Surgery

## 2022-03-15 VITALS — BP 134/82 | HR 75 | Temp 97.5°F | Resp 20 | Ht 65.0 in | Wt 160.0 lb

## 2022-03-15 DIAGNOSIS — H02834 Dermatochalasis of left upper eyelid: Secondary | ICD-10-CM

## 2022-03-15 DIAGNOSIS — L905 Scar conditions and fibrosis of skin: Secondary | ICD-10-CM | POA: Diagnosis not present

## 2022-03-15 DIAGNOSIS — W540XXS Bitten by dog, sequela: Secondary | ICD-10-CM

## 2022-03-15 DIAGNOSIS — H02831 Dermatochalasis of right upper eyelid: Secondary | ICD-10-CM | POA: Diagnosis not present

## 2022-03-15 NOTE — Progress Notes (Signed)
Referring Provider Center, [facility] [place],  [place] [address]   CC:  Chief Complaint  Patient presents with   Consult      Rodney Howe is an 71 y.o. male.  HPI: Rodney Howe is a 71 year old male who presents to the clinic for evaluation of a scar on the left lip and bilateral upper lid excess skin.  Patient notes that he had a dog bite to the lip 2 to 3 years ago which was repaired in the emergency room in [place].  He has a small amount of redundant tissue inside the mouth which he finds uncomfortable.  Additionally the way the lip was sutured has left a tightness at the left commissure which she finds uncomfortable.  He is also requesting evaluation of his upper eyelids.  He says the excess skin blocks his review specifically the peripheral vision in both eyes.  He would like to have this addressed as well.  No Known Allergies  Outpatient Encounter Medications as of 03/15/2022  Medication Sig Note   albuterol (VENTOLIN HFA) 108 (90 Base) MCG/ACT inhaler Inhale 2 puffs into the lungs every 6 (six) hours as needed for wheezing or shortness of breath.    amitriptyline (ELAVIL) 25 MG tablet Take by mouth.    aspirin EC 81 MG tablet Take 81 mg by mouth daily.    atorvastatin (LIPITOR) 40 MG tablet Take 40 mg by mouth every evening.    budesonide-formoterol (SYMBICORT) 80-4.5 MCG/ACT inhaler Inhale 2 puffs into the lungs 2 (two) times daily.    finasteride (PROSCAR) 5 MG tablet Take 1 tablet (5 mg total) by mouth daily.    gabapentin (NEURONTIN) 300 MG capsule Take 300 mg by mouth 2 (two) times daily. 12/16/2019: PRN   omeprazole (PRILOSEC) 20 MG capsule Take 20 mg by mouth daily.    SPIRIVA HANDIHALER 18 MCG inhalation capsule 1 capsule daily.    XIAFLEX 0.9 MG SOLR     sildenafil (REVATIO) 20 MG tablet 2-5 tablets as needed 1 hour prior to intercourse (Patient not taking: Reported on 03/15/2022)    tamsulosin (FLOMAX) 0.4 MG CAPS capsule  Take 1 capsule (0.4 mg total) by mouth daily. (Patient not taking: Reported on 03/15/2022)    No facility-administered encounter medications on file as of 03/15/2022.     Past Medical History:  Diagnosis Date   COPD (chronic obstructive pulmonary disease) ([place])     Past Surgical History:  Procedure Laterality Date   ANKLE SURGERY     COLONOSCOPY WITH PROPOFOL N/A 09/19/2020   Procedure: COLONOSCOPY WITH PROPOFOL;  Surgeon: Jonathon Bellows, MD;  Location: [facility] ENDOSCOPY;  Service: Gastroenterology;  Laterality: N/A;    History reviewed. No pertinent family history.  Social History   Social History Narrative   Not on file     Review of Systems General: Denies fevers, chills, weight loss CV: Denies chest pain, shortness of breath, palpitations Oral cavity: Patient has a well-healed wound on left lateral 25% of the lower lip.  He complains of numbness of the lip and scar tissue within the mouth Vision: Patient reports that his vision is obstructed by the excess tissue on his upper lids.   Physical Exam    03/15/2022   11:11 AM 08/21/2021    9:00 AM 01/12/2021    2:13 PM  Vitals with BMI  Height '5\' 5"'$  '5\' 5"'$  '5\' 6"'$   Weight 160 lbs 145 lbs 159 lbs  BMI 26.63 60.45 40.98  Systolic  237  628  Diastolic 82  75  Pulse 75  82    General:  No acute distress,  Alert and oriented, Non-Toxic, Normal speech and affect Oral cavity:The vermilion border is reasonably well aligned however there is a scar which causes contracture of the lip on the left side.  Intraorally he has a small amount of scar tissue and 1 very small piece of redundant scar tissue which projects into the oral cavity. Eye: Patient has significant upper lid redundant skin.  Brow position is a little low but acceptable.  The upper lid position is at the top of his at the top of the pupil.  Notes improvement of his vision when the skin on the upper lids is retracted.  He has good closure of both lids.   Assessment/Plan Scar:  Patient has a scar from the previous trauma.  I can easily excise the portion of tissue which extends into the oral cavity.  We discussed the numbness of the lip which cannot be repaired.I do not think that I can release the tightness of the left lower lip however, I would be willing to reevaluate this in the operating room. Dermatochalasis: Patient has significant upper eyelid skin which could be easily removed in the operating room.  Before this can be scheduled he will need to be evaluated by ophthalmology for visual field testing with and without taping I will submit a consult for this today. Once visual field testing is complete the patient will return and we will proceed with surgical scheduling.  [name] 03/15/2022, 11:36 AM

## 2022-04-08 ENCOUNTER — Telehealth: Payer: Self-pay | Admitting: Plastic Surgery

## 2022-04-08 NOTE — Telephone Encounter (Signed)
Dr. Dahlia Bailiff office called and stated they received a referral for this pt and provider does not treat for this dx and to please notifiy referring provider.

## 2022-08-22 ENCOUNTER — Ambulatory Visit
Admission: RE | Admit: 2022-08-22 | Discharge: 2022-08-22 | Disposition: A | Discharge status: Home / Self Care | Payer: 59 | Source: Ambulatory Visit | Attending: Acute Care | Admitting: Acute Care

## 2022-08-22 DIAGNOSIS — F17210 Nicotine dependence, cigarettes, uncomplicated: Secondary | ICD-10-CM

## 2022-08-22 DIAGNOSIS — Z87891 Personal history of nicotine dependence: Secondary | ICD-10-CM

## 2022-08-26 ENCOUNTER — Other Ambulatory Visit: Payer: Self-pay | Admitting: Acute Care

## 2022-08-26 DIAGNOSIS — Z87891 Personal history of nicotine dependence: Secondary | ICD-10-CM

## 2022-08-26 DIAGNOSIS — Z122 Encounter for screening for malignant neoplasm of respiratory organs: Secondary | ICD-10-CM

## 2023-07-28 NOTE — Progress Notes (Unsigned)
 07/30/2023 9:07 PM   Rodney Howe 1951/03/23 [id number]  Referring provider: Center, [facility] [address] [place],  [place] [address]  Urological history: 1. BPH with LU TS -cysto -enlarged prostate bilobar coaptation with a relatively short prostatic length 06/2020 -TRUS 25 cc 06/2020   2. Peyronie's disease -left mid shaft plaque palpated -one cycle of Xiaflex    3. ED -contributing factors of age, alcohol abuse, smoking, HLD, COPD and neuropathy -managed with sildenafil 20 mg, on-demand-dosing  No chief complaint on file.  HPI: Rodney Howe is a 73 y.o. male who presents today for follow up.    Previous records reviewed.   I PSS ***  PVR ***  UA ***    Score:  1-7 Mild 8-19 Moderate 20-35 Severe   SHIM ***    Score: 1-7 Severe ED 8-11 Moderate ED 12-16 Mild-Moderate ED 17-21 Mild ED 22-25 No ED   PMH: Past Medical History:  Diagnosis Date   COPD (chronic obstructive pulmonary disease) ([facility])     Surgical History: Past Surgical History:  Procedure Laterality Date   ANKLE SURGERY     COLONOSCOPY WITH PROPOFOL N/A 09/19/2020   Procedure: COLONOSCOPY WITH PROPOFOL;  Surgeon: Wyline Mood, MD;  Location: [facility] ENDOSCOPY;  Service: Gastroenterology;  Laterality: N/A;    Home Medications:  Allergies as of 07/30/2023   No Known Allergies      Medication List        Accurate as of July 28, 2023  9:07 PM. If you have any questions, ask your nurse or doctor.          albuterol 108 (90 Base) MCG/ACT inhaler Commonly known as: Ventolin HFA Inhale 2 puffs into the lungs every 6 (six) hours as needed for wheezing or shortness of breath.   amitriptyline 25 MG tablet Commonly known as: ELAVIL Take by mouth.   aspirin EC 81 MG tablet Take 81 mg by mouth daily.   atorvastatin 40 MG tablet Commonly known as: LIPITOR Take 40 mg by mouth every evening.   budesonide-formoterol 80-4.5 MCG/ACT  inhaler Commonly known as: SYMBICORT Inhale 2 puffs into the lungs 2 (two) times daily.   finasteride 5 MG tablet Commonly known as: PROSCAR Take 1 tablet (5 mg total) by mouth daily.   gabapentin 300 MG capsule Commonly known as: NEURONTIN Take 300 mg by mouth 2 (two) times daily.   omeprazole 20 MG capsule Commonly known as: PRILOSEC Take 20 mg by mouth daily.   sildenafil 20 MG tablet Commonly known as: REVATIO 2-5 tablets as needed 1 hour prior to intercourse   Spiriva HandiHaler 18 MCG inhalation capsule Generic drug: tiotropium 1 capsule daily.   tamsulosin 0.4 MG Caps capsule Commonly known as: FLOMAX Take 1 capsule (0.4 mg total) by mouth daily.   Xiaflex 0.9 MG Solr Generic drug: Collagenase Clostrid Histolyt        Allergies: No Known Allergies  Family History: No family history on file.  Social History:  reports that he quit smoking about 3 years ago. His smoking use included cigarettes. He started smoking about 52 years ago. He has a 98 pack-year smoking history. He has never used smokeless tobacco. He reports that he does not currently use alcohol after a past usage of about 36.0 standard drinks of alcohol per week. He reports that he does not currently use drugs.  ROS: Pertinent ROS in HPI  Physical Exam: There were no vitals taken for this visit.  Constitutional:  Well nourished. Alert and oriented, No acute distress. HEENT: [place] AT, moist mucus membranes.  Trachea midline, no masses. Cardiovascular: No clubbing, cyanosis, or edema. Respiratory: Normal respiratory effort, no increased work of breathing. GI: Abdomen is soft, non tender, non distended, no abdominal masses. Liver and spleen not palpable.  No hernias appreciated.  Stool sample for occult testing is not indicated.   GU: No CVA tenderness.  No bladder fullness or masses.  Patient with circumcised/uncircumcised phallus. ***Foreskin easily retracted***  Urethral meatus is patent.  No penile  discharge. No penile lesions or rashes. Scrotum without lesions, cysts, rashes and/or edema.  Testicles are located scrotally bilaterally. No masses are appreciated in the testicles. Left and right epididymis are normal. Rectal: Patient with  normal sphincter tone. Anus and perineum without scarring or rashes. No rectal masses are appreciated. Prostate is approximately *** grams, *** nodules are appreciated. Seminal vesicles are normal. Skin: No rashes, bruises or suspicious lesions. Lymph: No cervical or inguinal adenopathy. Neurologic: Grossly intact, no focal deficits, moving all 4 extremities. Psychiatric: Normal mood and affect.  Laboratory Data: Urinalysis See EPIC and HPI  I have reviewed the labs.   Pertinent Imaging: ***   Assessment & Plan:  ***  1. BPH with LUTS -PSA stable *** -DRE benign *** -UA benign *** -PVR < 300 cc *** -symptoms - *** -most bothersome symptoms are *** -continue conservative management, avoiding bladder irritants and timed voiding's -Initiate alpha-blocker (***), discussed side effects *** -Initiate 5 alpha reductase inhibitor (***), discussed side effects *** -Continue tamsulosin 0.4 mg daily, alfuzosin 10 mg daily, Rapaflo 8 mg daily, terazosin, doxazosin, Cialis 5 mg daily and finasteride 5 mg daily, dutasteride 0.5 mg daily***:refills given -Cannot tolerate medication or medication failure, schedule cystoscopy ***  2. Erectile dysfunction - I explained to the patient that in order to achieve an erection it takes good functioning of the nervous system (parasympathetic and rs, sympathetic, sensory and motor), good blood flow into the erectile tissue of the penis and a desire to have sex - I explained that conditions like diabetes, hypertension, coronary artery disease, peripheral vascular disease, smoking, alcohol consumption, age, sleep apnea and BPH can diminish the ability to have an erection - I explained the ED may be a risk marker for  underlying CVD and he should follow up with PCP for further studies *** - we will obtain a serum testosterone level at this time; if it is abnormal we will need to repeat the study for confirmation *** - A recent study published in Sex Med 2018 Apr 13 revealed moderate to vigorous aerobic exercise for 40 minutes 4 times per week can decrease erectile problems caused by physical inactivity, obesity, hypertension, metabolic syndrome and/or cardiovascular diseases *** - We discussed trying a *** different PDE5 inhibitor, intra-urethral suppositories, intracavernous vasoactive drug injection therapy, vacuum erection devices, LI-ESWT and penile prosthesis implantation    3. Peyronie's diease -had one cycle of Xiaflex   No follow-ups on file.  These notes generated with voice recognition software. I apologize for typographical errors.  Cloretta Ned  [facility] [address]  [address] [place], [place] [address] [phone]

## 2023-07-30 ENCOUNTER — Encounter: Payer: Self-pay | Admitting: Urology

## 2023-07-30 ENCOUNTER — Ambulatory Visit: Payer: 59 | Admitting: Urology

## 2023-07-30 VITALS — BP 124/74 | HR 91

## 2023-07-30 DIAGNOSIS — N401 Enlarged prostate with lower urinary tract symptoms: Secondary | ICD-10-CM | POA: Diagnosis not present

## 2023-07-30 DIAGNOSIS — N486 Induration penis plastica: Secondary | ICD-10-CM

## 2023-07-30 DIAGNOSIS — N529 Male erectile dysfunction, unspecified: Secondary | ICD-10-CM

## 2023-07-30 DIAGNOSIS — F1010 Alcohol abuse, uncomplicated: Secondary | ICD-10-CM

## 2023-07-30 LAB — URINALYSIS, COMPLETE
Bilirubin, UA: NEGATIVE
Glucose, UA: NEGATIVE
Ketones, UA: NEGATIVE
Leukocytes,UA: NEGATIVE
Nitrite, UA: NEGATIVE
Protein,UA: NEGATIVE
RBC, UA: NEGATIVE
Specific Gravity, UA: 1.01 (ref 1.005–1.030)
Urobilinogen, Ur: 0.2 mg/dL (ref 0.2–1.0)
pH, UA: 6 (ref 5.0–7.5)

## 2023-07-30 LAB — BLADDER SCAN AMB NON-IMAGING: Scan Result: 123

## 2023-07-30 LAB — MICROSCOPIC EXAMINATION: Bacteria, UA: NONE SEEN

## 2023-07-30 MED ORDER — FINASTERIDE 5 MG PO TABS: 5.0000 mg | ORAL_TABLET | Freq: Every day | ORAL | 11 refills | Status: DC

## 2023-07-30 MED ORDER — SILDENAFIL CITRATE 100 MG PO TABS: 100.0000 mg | ORAL_TABLET | Freq: Every day | ORAL | 0 refills | Status: DC | PRN

## 2023-07-30 MED ORDER — TAMSULOSIN HCL 0.4 MG PO CAPS: 0.4000 mg | ORAL_CAPSULE | Freq: Every day | ORAL | 11 refills | Status: DC

## 2023-07-31 LAB — PSA: Prostate Specific Ag, Serum: 1.1 ng/mL (ref 0.0–4.0)

## 2023-07-31 LAB — TESTOSTERONE: Testosterone: 399 ng/dL (ref 264–916)

## 2023-08-25 ENCOUNTER — Ambulatory Visit: Admission: RE | Admit: 2023-08-25 | Payer: 59 | Source: Ambulatory Visit

## 2023-08-28 ENCOUNTER — Emergency Department
Admission: EM | Admit: 2023-08-28 | Discharge: 2023-08-28 | Disposition: A | Discharge status: Home / Self Care | Attending: Emergency Medicine | Admitting: Emergency Medicine

## 2023-08-28 ENCOUNTER — Emergency Department

## 2023-08-28 ENCOUNTER — Other Ambulatory Visit: Payer: Self-pay

## 2023-08-28 ENCOUNTER — Encounter: Payer: Self-pay | Admitting: Emergency Medicine

## 2023-08-28 DIAGNOSIS — J449 Chronic obstructive pulmonary disease, unspecified: Secondary | ICD-10-CM | POA: Insufficient documentation

## 2023-08-28 DIAGNOSIS — S32592A Other specified fracture of left pubis, initial encounter for closed fracture: Secondary | ICD-10-CM | POA: Insufficient documentation

## 2023-08-28 DIAGNOSIS — S79912A Unspecified injury of left hip, initial encounter: Secondary | ICD-10-CM | POA: Diagnosis present

## 2023-08-28 LAB — BASIC METABOLIC PANEL WITH GFR
Anion gap: 11 (ref 5–15)
BUN: 9 mg/dL (ref 8–23)
CO2: 23 mmol/L (ref 22–32)
Calcium: 9 mg/dL (ref 8.9–10.3)
Chloride: 99 mmol/L (ref 98–111)
Creatinine, Ser: 0.97 mg/dL (ref 0.61–1.24)
GFR, Estimated: >60 mL/min (ref >60)
Glucose, Bld: 111 mg/dL — ABNORMAL HIGH (ref 70–99)
Potassium: 3.3 mmol/L — ABNORMAL LOW (ref 3.5–5.1)
Sodium: 133 mmol/L — ABNORMAL LOW (ref 135–145)

## 2023-08-28 LAB — CBC WITH DIFFERENTIAL/PLATELET
Abs Immature Granulocytes: 0.06 10*3/uL (ref 0.00–0.07)
Basophils Absolute: 0.1 10*3/uL (ref 0.0–0.1)
Basophils Relative: 1 %
Eosinophils Absolute: 0.1 10*3/uL (ref 0.0–0.5)
Eosinophils Relative: 1 %
HCT: 45.2 % (ref 39.0–52.0)
Hemoglobin: 15.1 g/dL (ref 13.0–17.0)
Immature Granulocytes: 1 %
Lymphocytes Relative: 30 %
Lymphs Abs: 3 10*3/uL (ref 0.7–4.0)
MCH: 31 pg (ref 26.0–34.0)
MCHC: 33.4 g/dL (ref 30.0–36.0)
MCV: 92.8 fL (ref 80.0–100.0)
Monocytes Absolute: 1 10*3/uL (ref 0.1–1.0)
Monocytes Relative: 10 %
Neutro Abs: 5.8 10*3/uL (ref 1.7–7.7)
Neutrophils Relative %: 57 %
Platelets: 397 10*3/uL (ref 150–400)
RBC: 4.87 MIL/uL (ref 4.22–5.81)
RDW: 13.3 % (ref 11.5–15.5)
WBC: 9.9 10*3/uL (ref 4.0–10.5)
nRBC: 0 % (ref 0.0–0.2)

## 2023-08-28 MED ORDER — NAPROXEN 500 MG PO TABS: 500.0000 mg | ORAL_TABLET | Freq: Two times a day (BID) | ORAL | 0 refills | Status: DC

## 2023-08-28 MED ORDER — POTASSIUM CHLORIDE CRYS ER 20 MEQ PO TBCR
40.0000 meq | EXTENDED_RELEASE_TABLET | Freq: Once | ORAL | Status: AC
  Administered 2023-08-28: 40 meq via ORAL
  Filled 2023-08-28: qty 2

## 2023-08-28 MED ORDER — NAPROXEN 500 MG PO TABS
500.0000 mg | ORAL_TABLET | Freq: Once | ORAL | Status: AC
  Administered 2023-08-28: 500 mg via ORAL
  Filled 2023-08-28: qty 1

## 2023-08-28 MED ORDER — OXYCODONE-ACETAMINOPHEN 5-325 MG PO TABS
1.0000 | ORAL_TABLET | ORAL | Status: DC | PRN
  Administered 2023-08-28: 1 via ORAL
  Filled 2023-08-28: qty 1

## 2023-08-28 NOTE — ED Triage Notes (Signed)
 Pt via POV c/o severe back pain after he fell backwards off a boat onto concrete. Denies hitting head. Pt states he can't walk or move his feet after the fall.

## 2023-08-28 NOTE — ED Provider Notes (Signed)
 [facility] Provider Note    Event Date/Time   First MD Initiated Contact with Patient 08/28/23 1508     (approximate)   History   Chief Complaint: Fall   HPI  Rodney Howe is a 73 y.o. male with a history of COPD, GERD who comes ED complaining of low back pain after a fall which occurred last night.  He fell about 4 feet off of the deck of a boat onto a concrete landing.  Reports that he was able to get up at the time and walk, but overnight his muscles feel more stiff and today he feels like he cannot walk around.  No loss of sensation or motor function.  Denies headache or head injury or neck pain.  Patient denies passing out, but does note that he has a lifelong history of intermittent orthostatic dizziness and syncope since childhood.  He reports that about a week ago he had an episode of passing out in his kitchen which was not preceded by any pain palpitations or other acute complaints.  He woke up on his kitchen floor and felt fine.            Physical Exam   Triage Vital Signs: ED Triage Vitals  Encounter Vitals Group     BP 08/28/23 1224 133/61     Systolic BP Percentile --      Diastolic BP Percentile --      Pulse Rate 08/28/23 1224 100     Resp 08/28/23 1224 20     Temp 08/28/23 1224 97.7 F (36.5 C)     Temp Source 08/28/23 1224 Oral     SpO2 08/28/23 1224 93 %     Weight 08/28/23 1221 175 lb (79.4 kg)     Height 08/28/23 1221 5\' 5"  (1.651 m)     Head Circumference --      Peak Flow --      Pain Score 08/28/23 1221 10     Pain Loc --      Pain Education --      Exclude from Growth Chart --     Most recent vital signs: Vitals:   08/28/23 1224 08/28/23 1600  BP: 133/61 (!) 132/93  Pulse: 100 89  Resp: 20 16  Temp: 97.7 F (36.5 C)   SpO2: 93% 97%    General: Awake, no distress.  Dry oral mucosa CV:  Good peripheral perfusion.  Regular rate and rhythm Resp:  Normal effort.  Clear to auscultation  bilaterally Abd:  No distention.  Soft nontender Other:  No spinal tenderness.  Pelvis stable and nontender.  No long bone tenderness or deformity.  There is low back pain with passive leg raise on the left.  Intact motor function in all lower extremity muscle groups, EHL positive.  Intact sensation.  Plantar reflex downgoing   ED Results / Procedures / Treatments   Labs (all labs ordered are listed, but only abnormal results are displayed) Labs Reviewed  BASIC METABOLIC PANEL WITH GFR - Abnormal; Notable for the following components:      Result Value   Sodium 133 (*)    Potassium 3.3 (*)    Glucose, Bld 111 (*)    All other components within normal limits  CBC WITH DIFFERENTIAL/PLATELET     EKG Interpreted by me Sinus rhythm rate of 86.  Right axis, right bundle branch block.  No acute ischemic changes.   RADIOLOGY X-ray lumbar spine unremarkable X-ray pelvis interpreted  by me, no displaced fracture.  Radiology report reviewed noting nondisplaced left superior pubic ramus fracture.   PROCEDURES:  Procedures   MEDICATIONS ORDERED IN ED: Medications  oxyCODONE-acetaminophen (PERCOCET/ROXICET) 5-325 MG per tablet 1 tablet (1 tablet Oral Given 08/28/23 1226)  potassium chloride SA (KLOR-CON M) CR tablet 40 mEq (has no administration in time range)  naproxen (NAPROSYN) tablet 500 mg (500 mg Oral Given 08/28/23 1536)     IMPRESSION / MDM / ASSESSMENT AND PLAN / ED COURSE  I reviewed the triage vital signs and the nursing notes.  DDx: Lumbar spine fracture, lumbar disc herniation, dehydration, AKI, electrolyte derangement, anemia  Patient's presentation is most consistent with acute presentation with potential threat to life or bodily function.  Patient presents with likely mechanical fall off of a boat after losing his balance while standing on the edge.  He is neuro intact.  Musculoskeletal exam is reassuring.  Vital signs unremarkable.  Will check labs and  x-rays    ----------------------------------------- 5:19 PM on 08/28/2023 ----------------------------------------- X-rays reveal nondisplaced fracture of left inferior pubic ramus, stable.  Labs okay.  Will provide crutches for comfort, recommend NSAIDs, stable for discharge     FINAL CLINICAL IMPRESSION(S) / ED DIAGNOSES   Final diagnoses:  Pubic ramus fracture, left, closed, initial encounter (HCC)     Rx / DC Orders   ED Discharge Orders          Ordered    naproxen (NAPROSYN) 500 MG tablet  2 times daily with meals        08/28/23 1719             Note:  This document was prepared using Dragon voice recognition software and may include unintentional dictation errors.   Sharman Cheek, MD 08/28/23 [phone]

## 2023-09-01 ENCOUNTER — Emergency Department

## 2023-09-01 ENCOUNTER — Inpatient Hospital Stay
Admission: EM | Admit: 2023-09-01 | Discharge: 2023-09-04 | DRG: 811 | Disposition: A | Discharge status: Home Health | Attending: Internal Medicine | Admitting: Internal Medicine

## 2023-09-01 ENCOUNTER — Observation Stay

## 2023-09-01 ENCOUNTER — Other Ambulatory Visit: Payer: Self-pay

## 2023-09-01 DIAGNOSIS — K921 Melena: Secondary | ICD-10-CM

## 2023-09-01 DIAGNOSIS — J84112 Idiopathic pulmonary fibrosis: Secondary | ICD-10-CM | POA: Diagnosis present

## 2023-09-01 DIAGNOSIS — Z79899 Other long term (current) drug therapy: Secondary | ICD-10-CM

## 2023-09-01 DIAGNOSIS — Z8249 Family history of ischemic heart disease and other diseases of the circulatory system: Secondary | ICD-10-CM

## 2023-09-01 DIAGNOSIS — I2510 Atherosclerotic heart disease of native coronary artery without angina pectoris: Secondary | ICD-10-CM | POA: Diagnosis present

## 2023-09-01 DIAGNOSIS — N400 Enlarged prostate without lower urinary tract symptoms: Secondary | ICD-10-CM | POA: Diagnosis present

## 2023-09-01 DIAGNOSIS — Z8042 Family history of malignant neoplasm of prostate: Secondary | ICD-10-CM

## 2023-09-01 DIAGNOSIS — F1010 Alcohol abuse, uncomplicated: Secondary | ICD-10-CM | POA: Diagnosis present

## 2023-09-01 DIAGNOSIS — Z87891 Personal history of nicotine dependence: Secondary | ICD-10-CM

## 2023-09-01 DIAGNOSIS — Z8781 Personal history of (healed) traumatic fracture: Secondary | ICD-10-CM

## 2023-09-01 DIAGNOSIS — K922 Gastrointestinal hemorrhage, unspecified: Secondary | ICD-10-CM | POA: Diagnosis not present

## 2023-09-01 DIAGNOSIS — K253 Acute gastric ulcer without hemorrhage or perforation: Secondary | ICD-10-CM

## 2023-09-01 DIAGNOSIS — K920 Hematemesis: Secondary | ICD-10-CM

## 2023-09-01 DIAGNOSIS — K254 Chronic or unspecified gastric ulcer with hemorrhage: Principal | ICD-10-CM | POA: Diagnosis present

## 2023-09-01 DIAGNOSIS — S32592D Other specified fracture of left pubis, subsequent encounter for fracture with routine healing: Secondary | ICD-10-CM

## 2023-09-01 DIAGNOSIS — D72829 Elevated white blood cell count, unspecified: Secondary | ICD-10-CM | POA: Diagnosis not present

## 2023-09-01 DIAGNOSIS — D62 Acute posthemorrhagic anemia: Principal | ICD-10-CM | POA: Diagnosis present

## 2023-09-01 DIAGNOSIS — E663 Overweight: Secondary | ICD-10-CM | POA: Diagnosis present

## 2023-09-01 DIAGNOSIS — K219 Gastro-esophageal reflux disease without esophagitis: Secondary | ICD-10-CM | POA: Diagnosis present

## 2023-09-01 DIAGNOSIS — N401 Enlarged prostate with lower urinary tract symptoms: Secondary | ICD-10-CM

## 2023-09-01 DIAGNOSIS — J449 Chronic obstructive pulmonary disease, unspecified: Secondary | ICD-10-CM | POA: Diagnosis present

## 2023-09-01 DIAGNOSIS — Z7982 Long term (current) use of aspirin: Secondary | ICD-10-CM

## 2023-09-01 DIAGNOSIS — R55 Syncope and collapse: Secondary | ICD-10-CM | POA: Diagnosis not present

## 2023-09-01 DIAGNOSIS — I951 Orthostatic hypotension: Secondary | ICD-10-CM | POA: Diagnosis present

## 2023-09-01 DIAGNOSIS — Z7951 Long term (current) use of inhaled steroids: Secondary | ICD-10-CM

## 2023-09-01 DIAGNOSIS — K5900 Constipation, unspecified: Secondary | ICD-10-CM | POA: Diagnosis present

## 2023-09-01 DIAGNOSIS — K449 Diaphragmatic hernia without obstruction or gangrene: Secondary | ICD-10-CM | POA: Diagnosis present

## 2023-09-01 DIAGNOSIS — W19XXXD Unspecified fall, subsequent encounter: Secondary | ICD-10-CM | POA: Diagnosis present

## 2023-09-01 DIAGNOSIS — J42 Unspecified chronic bronchitis: Secondary | ICD-10-CM

## 2023-09-01 DIAGNOSIS — E785 Hyperlipidemia, unspecified: Secondary | ICD-10-CM | POA: Diagnosis present

## 2023-09-01 DIAGNOSIS — K259 Gastric ulcer, unspecified as acute or chronic, without hemorrhage or perforation: Secondary | ICD-10-CM

## 2023-09-01 HISTORY — DX: Hyperlipidemia, unspecified: E78.5

## 2023-09-01 HISTORY — DX: Benign prostatic hyperplasia without lower urinary tract symptoms: N40.0

## 2023-09-01 HISTORY — DX: Gastro-esophageal reflux disease without esophagitis: K21.9

## 2023-09-01 HISTORY — DX: Gastrointestinal hemorrhage, unspecified: K92.2

## 2023-09-01 LAB — CBC WITH DIFFERENTIAL/PLATELET
Abs Immature Granulocytes: 0.12 10*3/uL — ABNORMAL HIGH (ref 0.00–0.07)
Basophils Absolute: 0.1 10*3/uL (ref 0.0–0.1)
Basophils Relative: 0 %
Eosinophils Absolute: 0 10*3/uL (ref 0.0–0.5)
Eosinophils Relative: 0 %
HCT: 38.9 % — ABNORMAL LOW (ref 39.0–52.0)
Hemoglobin: 12.8 g/dL — ABNORMAL LOW (ref 13.0–17.0)
Immature Granulocytes: 1 %
Lymphocytes Relative: 20 %
Lymphs Abs: 2.7 10*3/uL (ref 0.7–4.0)
MCH: 30.6 pg (ref 26.0–34.0)
MCHC: 32.9 g/dL (ref 30.0–36.0)
MCV: 93.1 fL (ref 80.0–100.0)
Monocytes Absolute: 1.1 10*3/uL — ABNORMAL HIGH (ref 0.1–1.0)
Monocytes Relative: 8 %
Neutro Abs: 9.4 10*3/uL — ABNORMAL HIGH (ref 1.7–7.7)
Neutrophils Relative %: 71 %
Platelets: 434 10*3/uL — ABNORMAL HIGH (ref 150–400)
RBC: 4.18 MIL/uL — ABNORMAL LOW (ref 4.22–5.81)
RDW: 13.2 % (ref 11.5–15.5)
WBC: 13.4 10*3/uL — ABNORMAL HIGH (ref 4.0–10.5)
nRBC: 0 % (ref 0.0–0.2)

## 2023-09-01 LAB — COMPREHENSIVE METABOLIC PANEL WITH GFR
ALT: 25 U/L (ref 0–44)
AST: 24 U/L (ref 15–41)
Albumin: 2.8 g/dL — ABNORMAL LOW (ref 3.5–5.0)
Alkaline Phosphatase: 45 U/L (ref 38–126)
Anion gap: 7 (ref 5–15)
BUN: 49 mg/dL — ABNORMAL HIGH (ref 8–23)
CO2: 22 mmol/L (ref 22–32)
Calcium: 8.6 mg/dL — ABNORMAL LOW (ref 8.9–10.3)
Chloride: 105 mmol/L (ref 98–111)
Creatinine, Ser: 1.06 mg/dL (ref 0.61–1.24)
GFR, Estimated: >60 mL/min (ref >60)
Glucose, Bld: 113 mg/dL — ABNORMAL HIGH (ref 70–99)
Potassium: 4.8 mmol/L (ref 3.5–5.1)
Sodium: 134 mmol/L — ABNORMAL LOW (ref 135–145)
Total Bilirubin: 0.6 mg/dL (ref 0.0–1.2)
Total Protein: 5.7 g/dL — ABNORMAL LOW (ref 6.5–8.1)

## 2023-09-01 LAB — URINALYSIS, ROUTINE W REFLEX MICROSCOPIC
Bilirubin Urine: NEGATIVE
Glucose, UA: NEGATIVE mg/dL
Hgb urine dipstick: NEGATIVE
Ketones, ur: NEGATIVE mg/dL
Leukocytes,Ua: NEGATIVE
Nitrite: NEGATIVE
Protein, ur: NEGATIVE mg/dL
Specific Gravity, Urine: >1.046 — ABNORMAL HIGH (ref 1.005–1.030)
pH: 5 (ref 5.0–8.0)

## 2023-09-01 LAB — TROPONIN I (HIGH SENSITIVITY)
Troponin I (High Sensitivity): 11 ng/L (ref <18)
Troponin I (High Sensitivity): 13 ng/L (ref <18)

## 2023-09-01 LAB — APTT: aPTT: 24 s (ref 24–36)

## 2023-09-01 LAB — CBC
HCT: 31 % — ABNORMAL LOW (ref 39.0–52.0)
Hemoglobin: 10.4 g/dL — ABNORMAL LOW (ref 13.0–17.0)
MCH: 31.6 pg (ref 26.0–34.0)
MCHC: 33.5 g/dL (ref 30.0–36.0)
MCV: 94.2 fL (ref 80.0–100.0)
Platelets: 331 10*3/uL (ref 150–400)
RBC: 3.29 MIL/uL — ABNORMAL LOW (ref 4.22–5.81)
RDW: 13.2 % (ref 11.5–15.5)
WBC: 10.7 10*3/uL — ABNORMAL HIGH (ref 4.0–10.5)
nRBC: 0 % (ref 0.0–0.2)

## 2023-09-01 LAB — LACTIC ACID, PLASMA: Lactic Acid, Venous: 1.5 mmol/L (ref 0.5–1.9)

## 2023-09-01 LAB — LIPASE, BLOOD: Lipase: 25 U/L (ref 11–51)

## 2023-09-01 LAB — TYPE AND SCREEN
ABO/RH(D): O POS
Antibody Screen: NEGATIVE

## 2023-09-01 LAB — PROTIME-INR
INR: 1.1 (ref 0.8–1.2)
Prothrombin Time: 14.6 s (ref 11.4–15.2)

## 2023-09-01 MED ORDER — OXYCODONE-ACETAMINOPHEN 5-325 MG PO TABS
1.0000 | ORAL_TABLET | ORAL | Status: DC | PRN
  Administered 2023-09-01 – 2023-09-04 (×6): 1 via ORAL
  Filled 2023-09-01 (×6): qty 1

## 2023-09-01 MED ORDER — PANTOPRAZOLE SODIUM 40 MG IV SOLR
40.0000 mg | Freq: Two times a day (BID) | INTRAVENOUS | Status: DC
  Administered 2023-09-02 – 2023-09-04 (×5): 40 mg via INTRAVENOUS
  Filled 2023-09-01 (×5): qty 10

## 2023-09-01 MED ORDER — LORAZEPAM 2 MG/ML IJ SOLN: 1.0000 mg | INTRAMUSCULAR | Status: DC | PRN

## 2023-09-01 MED ORDER — ONDANSETRON HCL 4 MG/2ML IJ SOLN
4.0000 mg | Freq: Once | INTRAMUSCULAR | Status: AC
  Administered 2023-09-01: 4 mg via INTRAVENOUS
  Filled 2023-09-01: qty 2

## 2023-09-01 MED ORDER — FENTANYL CITRATE PF 50 MCG/ML IJ SOSY
50.0000 ug | PREFILLED_SYRINGE | Freq: Once | INTRAMUSCULAR | Status: AC
  Administered 2023-09-01: 50 ug via INTRAVENOUS
  Filled 2023-09-01: qty 1

## 2023-09-01 MED ORDER — PANTOPRAZOLE SODIUM 40 MG IV SOLR
40.0000 mg | Freq: Once | INTRAVENOUS | Status: AC
  Administered 2023-09-01: 40 mg via INTRAVENOUS
  Filled 2023-09-01: qty 10

## 2023-09-01 MED ORDER — MORPHINE SULFATE (PF) 2 MG/ML IV SOLN: 2.0000 mg | INTRAVENOUS | Status: DC | PRN

## 2023-09-01 MED ORDER — DM-GUAIFENESIN ER 30-600 MG PO TB12
1.0000 | ORAL_TABLET | Freq: Two times a day (BID) | ORAL | Status: DC | PRN
Start: 2023-09-01 — End: 2023-09-04

## 2023-09-01 MED ORDER — FOLIC ACID 1 MG PO TABS
1.0000 mg | ORAL_TABLET | Freq: Every day | ORAL | Status: DC
  Administered 2023-09-01 – 2023-09-04 (×4): 1 mg via ORAL
  Filled 2023-09-01 (×4): qty 1

## 2023-09-01 MED ORDER — LORAZEPAM 1 MG PO TABS: 1.0000 mg | ORAL_TABLET | ORAL | Status: DC | PRN

## 2023-09-01 MED ORDER — SODIUM CHLORIDE 0.9 % IV BOLUS
1000.0000 mL | Freq: Once | INTRAVENOUS | Status: AC
  Administered 2023-09-01: 1000 mL via INTRAVENOUS

## 2023-09-01 MED ORDER — THIAMINE MONONITRATE 100 MG PO TABS
100.0000 mg | ORAL_TABLET | Freq: Every day | ORAL | Status: DC
  Administered 2023-09-01 – 2023-09-04 (×4): 100 mg via ORAL
  Filled 2023-09-01 (×4): qty 1

## 2023-09-01 MED ORDER — ADULT MULTIVITAMIN W/MINERALS CH
1.0000 | ORAL_TABLET | Freq: Every day | ORAL | Status: DC
  Administered 2023-09-01 – 2023-09-04 (×4): 1 via ORAL
  Filled 2023-09-01 (×4): qty 1

## 2023-09-01 MED ORDER — ALBUTEROL SULFATE (2.5 MG/3ML) 0.083% IN NEBU: 2.5000 mg | INHALATION_SOLUTION | RESPIRATORY_TRACT | Status: DC | PRN

## 2023-09-01 MED ORDER — SENNOSIDES-DOCUSATE SODIUM 8.6-50 MG PO TABS
1.0000 | ORAL_TABLET | Freq: Two times a day (BID) | ORAL | Status: DC
  Administered 2023-09-01 – 2023-09-04 (×6): 1 via ORAL
  Filled 2023-09-01 (×6): qty 1

## 2023-09-01 MED ORDER — SODIUM CHLORIDE 0.9 % IV SOLN: INTRAVENOUS | Status: AC

## 2023-09-01 MED ORDER — THIAMINE HCL 100 MG/ML IJ SOLN
100.0000 mg | Freq: Every day | INTRAMUSCULAR | Status: DC
  Filled 2023-09-01 (×2): qty 2

## 2023-09-01 MED ORDER — LORAZEPAM 2 MG/ML IJ SOLN
0.0000 mg | Freq: Four times a day (QID) | INTRAMUSCULAR | Status: AC
  Administered 2023-09-01 – 2023-09-02 (×3): 2 mg via INTRAVENOUS
  Filled 2023-09-01 (×3): qty 1

## 2023-09-01 MED ORDER — SODIUM CHLORIDE 0.9 % IV BOLUS
500.0000 mL | Freq: Once | INTRAVENOUS | Status: AC
  Administered 2023-09-01: 500 mL via INTRAVENOUS

## 2023-09-01 MED ORDER — IOHEXOL 350 MG/ML SOLN
100.0000 mL | Freq: Once | INTRAVENOUS | Status: AC | PRN
  Administered 2023-09-01: 100 mL via INTRAVENOUS

## 2023-09-01 MED ORDER — LORAZEPAM 2 MG/ML IJ SOLN: 0.0000 mg | Freq: Two times a day (BID) | INTRAMUSCULAR | Status: DC

## 2023-09-01 NOTE — H&P (Signed)
 History and Physical    Rodney Howe QMV:[id number] DOB: [birth year] DOA: 09/01/2023  Referring MD/NP/PA:   PCP: [facility]   Patient coming from:  The patient is coming from home.     Chief Complaint: Abdominal pain, black stool, hematemesis, SOB, syncope  HPI: Rodney Howe is a 73 y.o. male with medical history significant of alcohol abuse, former smoker, HLD, COPD, GERD, depression, BPH, who presents with abdominal pain, black stool, hematemesis, SOB, syncope.  Pt states that he has abdominal pain in the past several days, which is located in the middle and upper abdomen, intermittent, aching, nonradiating.  Associated with 1 episode of hematemesis with black blood today and several episode of black stool bowel movements.  Patient is constipated.  He states that he had some chest pain and chest discomfort earlier, which has resolved.  He has cough with little mucus production and SOB.  Patient states that he passed out 3 times last Thursday, Wednesday and today at home.  He injured his right shoulder and left hip, causing mild pain in these areas.  Denies headache or neck pain.  No symptoms of UTI.  Patient states he is taking aspirin 81 mg occasionally, not taking Aleve.   Data reviewed independently and ED Course: pt was found to have hemoglobin dropped from 15.1 08/28/2018 5-12.8, WBC 13.4, GFR> 6, liver function okay, lipase normal 25, troponin 11 --> 13, lactic acid 1.5.  Chest x-ray negative.  Patient is placed in telemetry bed for obs.  Dr. Tobi Bastos of GI is consulted.   CTA of chest/abdomen/pelvis: 1. Normal contour and caliber of the thoracic and abdominal aorta. No evidence of aneurysm, dissection, or other acute aortic pathology. Mild aortic atherosclerosis. 2. Mild pulmonary fibrosis in a pattern with slight apical to basal gradient, featuring irregular peripheral interstitial opacity, without evidence significant subpleural bronchiolectasis  or honeycombing. Findings are indeterminate for UIP by ATS pulmonary fibrosis criteria. 3. Coronary artery disease. 4. Moderate hiatal hernia with intrathoracic position of the gastric fundus. 5. Hepatic steatosis. 6. Descending and sigmoid diverticulosis without evidence of acute diverticulitis. 7. Partially imaged left hydrocele.    EKG: I have personally reviewed.  Sinus rhythm, QTc 472, right bundle blockade, poor R wave progression, RAD, low voltage.   Review of Systems:   General: no fevers, chills, no body weight gain, fatigue HEENT: no blurry vision, hearing changes or sore throat Respiratory: has dyspnea, coughing, no wheezing CV: had chest pain, no palpitations GI: has hematemesis, black stool, constipation, abdominal pain GU: no dysuria, burning on urination, increased urinary frequency, hematuria  Ext: no leg edema Neuro: no unilateral weakness, numbness, or tingling, no vision change or hearing loss. Has syncope Skin: no rash, no skin tear. MSK: No muscle spasm, no deformity, no limitation of range of movement in spin Heme: No easy bruising.  Travel history: No recent long distant travel.   Allergy: No Known Allergies  Past Medical History:  Diagnosis Date   BPH (benign prostatic hyperplasia)    COPD (chronic obstructive pulmonary disease) (HCC)    COPD (chronic obstructive pulmonary disease) (HCC)    GERD (gastroesophageal reflux disease)    HLD (hyperlipidemia)     Past Surgical History:  Procedure Laterality Date   ANKLE SURGERY     COLONOSCOPY WITH PROPOFOL N/A 09/19/2020   Procedure: COLONOSCOPY WITH PROPOFOL;  Surgeon: Wyline Mood, MD;  Location: [facility] ENDOSCOPY;  Service: Gastroenterology;  Laterality: N/A;    Social History:  reports that he quit  smoking about 3 years ago. His smoking use included cigarettes. He started smoking about 52 years ago. He has a 98 pack-year smoking history. He has never used smokeless tobacco. He reports current alcohol  use of about 36.0 standard drinks of alcohol per week. He reports that he does not currently use drugs.  Family History:  Family History  Problem Relation Age of Onset   Heart attack Mother    Prostate cancer Father      Prior to Admission medications   Medication Sig Start Date End Date Taking? Authorizing Provider  albuterol (VENTOLIN HFA) 108 (90 Base) MCG/ACT inhaler Inhale 2 puffs into the lungs every 6 (six) hours as needed for wheezing or shortness of breath. 11/17/17   Houston Siren, MD  amitriptyline (ELAVIL) 25 MG tablet Take by mouth. 02/05/12   [provider]  aspirin EC 81 MG tablet Take 81 mg by mouth daily.    [provider]  atorvastatin (LIPITOR) 40 MG tablet Take 40 mg by mouth every evening.    [provider]  budesonide-formoterol (SYMBICORT) 80-4.5 MCG/ACT inhaler Inhale 2 puffs into the lungs 2 (two) times daily. 11/17/17   Houston Siren, MD  finasteride (PROSCAR) 5 MG tablet Take 1 tablet (5 mg total) by mouth daily. 07/30/23   Michiel Cowboy A, PA-C  gabapentin (NEURONTIN) 300 MG capsule Take 300 mg by mouth 2 (two) times daily.    [provider]  naproxen (NAPROSYN) 500 MG tablet Take 1 tablet (500 mg total) by mouth 2 (two) times daily with a meal. 08/28/23   Sharman Cheek, MD  omeprazole (PRILOSEC) 20 MG capsule Take 20 mg by mouth daily.    [provider]  sildenafil (VIAGRA) 100 MG tablet Take 1 tablet (100 mg total) by mouth daily as needed for erectile dysfunction. 07/30/23   Michiel Cowboy A, PA-C  SPIRIVA HANDIHALER 18 MCG inhalation capsule 1 capsule daily. 08/08/20   [provider]  tamsulosin (FLOMAX) 0.4 MG CAPS capsule Take 1 capsule (0.4 mg total) by mouth daily. 07/30/23   Harle Battiest, PA-C  XIAFLEX 0.9 MG SOLR  12/14/20   [provider]    Physical Exam: Vitals:   09/01/23 1539 09/01/23 1600 09/01/23 1809 09/01/23 2000  BP: 92/78 120/85 (!) 149/85 118/85  Pulse: (!)  126 79 (!) 122 (!) 125  Resp: 19 (!) 31 (!) 24 (!) 23  Temp: 98 F (36.7 C)   98.4 F (36.9 C)  TempSrc:    Oral  SpO2: 100% 100% 100% 97%   General: Not in acute distress HEENT:       Eyes: PERRL, EOMI, no jaundice       ENT: No discharge from the ears and nose, no pharynx injection, no tonsillar enlargement.        Neck: No JVD, no bruit, no mass felt. Heme: No neck lymph node enlargement. Cardiac: S1/S2, RRR, No murmurs, No gallops or rubs. Respiratory: No rales, wheezing, rhonchi or rubs. GI: Soft, nondistended, has tenderness in upper abdomen,  no rebound pain, no organomegaly, BS present. GU: No hematuria Ext: No pitting leg edema bilaterally. 1+DP/PT pulse bilaterally. Musculoskeletal: No joint deformities, No joint redness or warmth, no limitation of ROM in spin. Skin: No rashes.  Neuro: Alert, oriented X3, cranial nerves II-XII grossly intact, moves all extremities normally. Muscle strength 5/5 in all extremities, sensation to light touch intact.  Psych: Patient is not psychotic, no suicidal or hemocidal ideation.  Labs on Admission:  I have personally reviewed following labs and imaging studies  CBC: Recent Labs  Lab 08/28/23 1537 09/01/23 1551 09/01/23 2020  WBC 9.9 13.4* 10.7*  NEUTROABS 5.8 9.4*  --   HGB 15.1 12.8* 10.4*  HCT 45.2 38.9* 31.0*  MCV 92.8 93.1 94.2  PLT 397 434* 331   Basic Metabolic Panel: Recent Labs  Lab 08/28/23 1537 09/01/23 1813  NA 133* 134*  K 3.3* 4.8  CL 99 105  CO2 23 22  GLUCOSE 111* 113*  BUN 9 49*  CREATININE 0.97 1.06  CALCIUM 9.0 8.6*   GFR: Estimated Creatinine Clearance: 63 mL/min (by C-G formula based on SCr of 1.06 mg/dL). Liver Function Tests: Recent Labs  Lab 09/01/23 1813  AST 24  ALT 25  ALKPHOS 45  BILITOT 0.6  PROT 5.7*  ALBUMIN 2.8*   Recent Labs  Lab 09/01/23 1813  LIPASE 25   No results for input(s): "AMMONIA" in the last 168 hours. Coagulation Profile: Recent Labs  Lab 09/01/23 2020   INR 1.1   Cardiac Enzymes: No results for input(s): "CKTOTAL", "CKMB", "CKMBINDEX", "TROPONINI" in the last 168 hours. BNP (last 3 results) No results for input(s): "PROBNP" in the last 8760 hours. HbA1C: No results for input(s): "HGBA1C" in the last 72 hours. CBG: No results for input(s): "GLUCAP" in the last 168 hours. Lipid Profile: No results for input(s): "CHOL", "HDL", "LDLCALC", "TRIG", "CHOLHDL", "LDLDIRECT" in the last 72 hours. Thyroid Function Tests: No results for input(s): "TSH", "T4TOTAL", "FREET4", "T3FREE", "THYROIDAB" in the last 72 hours. Anemia Panel: No results for input(s): "VITAMINB12", "FOLATE", "FERRITIN", "TIBC", "IRON", "RETICCTPCT" in the last 72 hours. Urine analysis:    Component Value Date/Time   COLORURINE STRAW (A) 09/01/2023 2020   APPEARANCEUR CLEAR (A) 09/01/2023 2020   APPEARANCEUR Clear 07/30/2023 1021   LABSPEC >1.046 (H) 09/01/2023 2020   PHURINE 5.0 09/01/2023 2020   GLUCOSEU NEGATIVE 09/01/2023 2020   HGBUR NEGATIVE 09/01/2023 2020   BILIRUBINUR NEGATIVE 09/01/2023 2020   BILIRUBINUR Negative 07/30/2023 1021   KETONESUR NEGATIVE 09/01/2023 2020   PROTEINUR NEGATIVE 09/01/2023 2020   NITRITE NEGATIVE 09/01/2023 2020   LEUKOCYTESUR NEGATIVE 09/01/2023 2020   Sepsis Labs: @LABRCNTIP (procalcitonin:4,lacticidven:4) )No results found for this or any previous visit (from the past 240 hours).   Radiological Exams on Admission:   Assessment/Plan Principal Problem:   GI bleeding Active Problems:   Acute blood loss anemia   Syncope   Leukocytosis   COPD (chronic obstructive pulmonary disease) (HCC)   Alcohol abuse   HLD (hyperlipidemia)   BPH (benign prostatic hyperplasia)   History of pelvic fracture   Overweight (BMI 25.0-29.9)   Assessment and Plan:  GI bleeding and acute blood loss anemia:  51M, hx of alcohol abuse, HLD, COPD, BPH, presents with 1 episode of hematemesis with black blood today and several episodes of black  stool bowel movement in the past several days, had three episodes of syncope. CTA of chest/abdomen/pelvis is negative for dissection. CTA showed diverticulosis without evidence of acute diverticulitis. Hgb dropped from 15.1 on 3/27 --> 12.8. pt occasionally taking aspirin 81 mg.  Not taking Aleve per pt.          Syncope   Leukocytosis   COPD (chronic obstructive pulmonary disease) (HCC)   Alcohol abuse   HLD (hyperlipidemia)   BPH (benign prostatic hyperplasia)   History of pelvic fracture   Overweight (BMI 25.0-29.9)      DVT ppx: SCD  Code Status: Full code   Family Communication:  not done, no family member is at bed side.    Disposition Plan:  Anticipate discharge back to previous environment  Consults called:  Dr. Tobi Bastos of GI  Admission status and Level of care: Telemetry Medical:    for obs       Dispo: The patient is from: Home              Anticipated d/c is to: Home              Anticipated d/c date is: 1 day              Patient currently is not medically stable to d/c.    Severity of Illness:  The appropriate patient status for this patient is OBSERVATION. Observation status is judged to be reasonable and necessary in order to provide the required intensity of service to ensure the patient's safety. The patient's presenting symptoms, physical exam findings, and initial radiographic and laboratory data in the context of their medical condition is felt to place them at decreased risk for further clinical deterioration. Furthermore, it is anticipated that the patient will be medically stable for discharge from the hospital within 2 midnights of admission.        Date of Service 09/01/2023    Lorretta Harp [facility]   If 7PM-7AM, please contact night-coverage [email or web address] 09/01/2023, 9:36 PM

## 2023-09-01 NOTE — ED Provider Notes (Signed)
 [facility] Provider Note    Event Date/Time   First MD Initiated Contact with Patient 09/01/23 1552     (approximate)   History   Shortness of Breath and Abdominal Pain   HPI Rodney Howe is a 73 y.o. male with history of HLD, GERD, COPD presenting today for syncope.  Patient states over the past week he has had 3 episodes of syncope with no obvious prodromal symptoms such as shortness of breath, chest pain.  1 episode a week ago led to him falling and injuring his left hip and right shoulder.  He was seen in the ED at that time and found to have nondisplaced left inferior pubic rami fracture but otherwise stable.  He was safe for discharge at that time.  He notes over the past 24 to 48 hours she has had worsening abdominal pain.  Initially started with chest pain for radiating down and then towards his back.  Denies any nausea or vomiting.  While resting in bed, he had another unprovoked syncopal episode which brought him to the emergency department.  Right now he notes mild chest pain and generalized abdominal pain associated with shortness of breath.  Has also noted black stools over the past several days.  No history of GI bleed and not on a blood thinner.     Physical Exam   Triage Vital Signs: ED Triage Vitals [09/01/23 1539]  Encounter Vitals Group     BP 92/78     Systolic BP Percentile      Diastolic BP Percentile      Pulse Rate (!) 126     Resp 19     Temp 98 F (36.7 C)     Temp src      SpO2 100 %     Weight      Height      Head Circumference      Peak Flow      Pain Score 10     Pain Loc      Pain Education      Exclude from Growth Chart     Most recent vital signs: Vitals:   09/01/23 1600 09/01/23 1809  BP: 120/85 (!) 149/85  Pulse: 79 (!) 122  Resp: (!) 31 (!) 24  Temp:    SpO2: 100% 100%   Physical Exam: I have reviewed the vital signs and nursing notes. General: Awake, alert, no acute distress.  Nontoxic  appearing. Head:  Atraumatic, normocephalic.   ENT:  EOM intact, PERRL. Oral mucosa is pink and moist with no lesions. Neck: Neck is supple with full range of motion, No meningeal signs. Cardiovascular:  tachycardia, RR, No murmurs. Peripheral pulses palpable and equal bilaterally. Respiratory:  Symmetrical chest wall expansion.  No rhonchi, rales, or wheezes.  Good air movement throughout.  No use of accessory muscles.   Musculoskeletal:  No cyanosis or edema. Moving extremities with full ROM Abdomen:  Soft, generalized tenderness palpation throughout with distended abdomen Neuro:  GCS 15, moving all four extremities, interacting appropriately. Speech clear. Psych:  Calm, appropriate.   Skin:  Warm, dry, no rash.    ED Results / Procedures / Treatments   Labs (all labs ordered are listed, but only abnormal results are displayed) Labs Reviewed  CBC WITH DIFFERENTIAL/PLATELET - Abnormal; Notable for the following components:      Result Value   WBC 13.4 (*)    RBC 4.18 (*)    Hemoglobin 12.8 (*)  HCT 38.9 (*)    Platelets 434 (*)    Neutro Abs 9.4 (*)    Monocytes Absolute 1.1 (*)    Abs Immature Granulocytes 0.12 (*)    All other components within normal limits  COMPREHENSIVE METABOLIC PANEL WITH GFR - Abnormal; Notable for the following components:   Sodium 134 (*)    Glucose, Bld 113 (*)    BUN 49 (*)    Calcium 8.6 (*)    Total Protein 5.7 (*)    Albumin 2.8 (*)    All other components within normal limits  LACTIC ACID, PLASMA  LIPASE, BLOOD  URINALYSIS, ROUTINE W REFLEX MICROSCOPIC  TYPE AND SCREEN  TROPONIN I (HIGH SENSITIVITY)  TROPONIN I (HIGH SENSITIVITY)     EKG My EKG interpretation: Rate of 126, sinus tachycardia.  Right bundle branch block.  Otherwise normal intervals.  No acute ST elevations or depressions.  Accelerated rate but otherwise consistent with prior EKGs   RADIOLOGY Independently interpreted chest x-ray as well as CTA chest/abdomen/pelvis  with no acute intra-abdominal or intrathoracic pathology.  No issues with the aorta   PROCEDURES:  Critical Care performed: No  Procedures   MEDICATIONS ORDERED IN ED: Medications  sodium chloride 0.9 % bolus 1,000 mL (has no administration in time range)  pantoprazole (PROTONIX) injection 40 mg (has no administration in time range)  sodium chloride 0.9 % bolus 1,000 mL (0 mLs Intravenous Stopped 09/01/23 1801)  fentaNYL (SUBLIMAZE) injection 50 mcg (50 mcg Intravenous Given 09/01/23 1616)  iohexol (OMNIPAQUE) 350 MG/ML injection 100 mL (100 mLs Intravenous Contrast Given 09/01/23 1628)  ondansetron (ZOFRAN) injection 4 mg (4 mg Intravenous Given 09/01/23 1849)  fentaNYL (SUBLIMAZE) injection 50 mcg (50 mcg Intravenous Given 09/01/23 1849)     IMPRESSION / MDM / ASSESSMENT AND PLAN / ED COURSE  I reviewed the triage vital signs and the nursing notes.                              Differential diagnosis includes, but is not limited to, aortic dissection, AAA, ACS, pneumonia, intra-abdominal bleeding secondary to trauma, colitis, enteritis, GI bleed  Patient's presentation is most consistent with acute complicated illness / injury requiring diagnostic workup.  Patient is a 73 year old male presenting today for syncope today associated with chest pain and abdominal pain radiating to his back.  Also having shortness of breath.  Notably tachycardic on arrival with slight hypotension but that resolved by the time he came into the room.  Generalized tenderness palpation throughout the abdomen and slight sweating.  Will get CTA chest/abdomen/pelvis for further evaluation.  Patient given 1 L fluids as well as IV fentanyl for symptomatic control and possible hypovolemia with decreased p.o. intake.  Chest x-ray unremarkable.  CTA chest/abdomen/pelvis shows no acute issues with aorta or no other obvious intrathoracic or intra-abdominal pathology.  Patient had episode of vomiting and testing the vomit  was Hemoccult positive for blood and concerning for upper GI bleed with melanotic stools recently.  Given additional liter of fluids, nausea medication, and pain medicine.  Otherwise, CBC with only slight leukocytosis.  CMP grossly normal.  Lactic acid normal.  Troponin negative x 2 and normal lipase.  Will admit to hospitalist for ongoing workup of syncopal episodes as well as upper GI bleed.  Patient also given Protonix.  The patient is on the cardiac monitor to evaluate for evidence of arrhythmia and/or significant heart rate changes. Clinical Course as  of 09/01/23 1931  Mon Sep 01, 2023  1924 Patient had vomiting episode that was Hemoccult positive.  Given dark stools, will admit to hospitalist for ongoing care and GI evaluation tomorrow with otherwise reassuring imaging [DW]    Clinical Course User Index [DW] Janith Lima, MD     FINAL CLINICAL IMPRESSION(S) / ED DIAGNOSES   Final diagnoses:  Upper GI bleed  Syncope and collapse     Rx / DC Orders   ED Discharge Orders     None        Note:  This document was prepared using Dragon voice recognition software and may include unintentional dictation errors.   Janith Lima, MD 09/01/23 [id number]

## 2023-09-01 NOTE — ED Triage Notes (Addendum)
 Pt comes via EMs with c/o sob, stomach pain. Pt stats something is broke inside. Pt states he was real red yesterday. Pt states today he was real white and they couldn't get a reading on his O2.   Pt states he was having some heart burn that started last night and now it has traveled down his chest to his abdomen. Pt states he couldn't get any sleep.  Pt keeps moving around in chair and unable to sit still.   Pt states he also passed out today. Pt sweaty and diaphoretic. Pt also states he was here last Wed and had fallen and fractured his pelvis or something.

## 2023-09-01 NOTE — ED Notes (Signed)
 Pt to CT via stretcher

## 2023-09-01 NOTE — ED Provider Triage Note (Signed)
 Emergency Medicine Provider Triage Evaluation Note  Rodney Howe , a 73 y.o. male  was evaluated in triage.  Pt complains of SOB and stomach pain. Was seen in the ER on 3/27 after a fall, had a pelvic ramus fracture.  Review of Systems  Positive: Lower abdominal pain Negative:   Physical Exam  BP 92/78   Pulse (!) 126   Temp 98 F (36.7 C)   Resp 19   SpO2 100%  Gen:   Awake, no distress   Resp:  Normal effort  MSK:   Moves extremities without difficulty  Other:  Pt cannot get comfortable, sweaty  Medical Decision Making  Medically screening exam initiated at 3:44 PM.  Appropriate orders placed.  Rodney Howe was informed that the remainder of the evaluation will be completed by another provider, this initial triage assessment does not replace that evaluation, and the importance of remaining in the ED until their evaluation is complete.     Cameron Ali, PA-C 09/01/23 1548

## 2023-09-02 ENCOUNTER — Inpatient Hospital Stay: Admitting: Anesthesiology

## 2023-09-02 ENCOUNTER — Encounter: Payer: Self-pay | Admitting: Internal Medicine

## 2023-09-02 ENCOUNTER — Encounter
Admission: EM | Disposition: A | Discharge status: Home Health | Payer: Self-pay | Source: Home / Self Care | Attending: Internal Medicine

## 2023-09-02 ENCOUNTER — Other Ambulatory Visit: Payer: Self-pay

## 2023-09-02 DIAGNOSIS — K922 Gastrointestinal hemorrhage, unspecified: Secondary | ICD-10-CM | POA: Diagnosis not present

## 2023-09-02 DIAGNOSIS — K920 Hematemesis: Secondary | ICD-10-CM | POA: Diagnosis not present

## 2023-09-02 DIAGNOSIS — F1010 Alcohol abuse, uncomplicated: Secondary | ICD-10-CM | POA: Diagnosis present

## 2023-09-02 DIAGNOSIS — W19XXXD Unspecified fall, subsequent encounter: Secondary | ICD-10-CM | POA: Diagnosis present

## 2023-09-02 DIAGNOSIS — R55 Syncope and collapse: Secondary | ICD-10-CM | POA: Diagnosis present

## 2023-09-02 DIAGNOSIS — K259 Gastric ulcer, unspecified as acute or chronic, without hemorrhage or perforation: Secondary | ICD-10-CM

## 2023-09-02 DIAGNOSIS — K449 Diaphragmatic hernia without obstruction or gangrene: Secondary | ICD-10-CM | POA: Diagnosis present

## 2023-09-02 DIAGNOSIS — K921 Melena: Secondary | ICD-10-CM | POA: Diagnosis not present

## 2023-09-02 DIAGNOSIS — Z8249 Family history of ischemic heart disease and other diseases of the circulatory system: Secondary | ICD-10-CM | POA: Diagnosis not present

## 2023-09-02 DIAGNOSIS — S32592D Other specified fracture of left pubis, subsequent encounter for fracture with routine healing: Secondary | ICD-10-CM | POA: Diagnosis not present

## 2023-09-02 DIAGNOSIS — J84112 Idiopathic pulmonary fibrosis: Secondary | ICD-10-CM | POA: Diagnosis present

## 2023-09-02 DIAGNOSIS — R710 Precipitous drop in hematocrit: Secondary | ICD-10-CM

## 2023-09-02 DIAGNOSIS — D72829 Elevated white blood cell count, unspecified: Secondary | ICD-10-CM | POA: Diagnosis present

## 2023-09-02 DIAGNOSIS — J449 Chronic obstructive pulmonary disease, unspecified: Secondary | ICD-10-CM | POA: Diagnosis present

## 2023-09-02 DIAGNOSIS — I951 Orthostatic hypotension: Secondary | ICD-10-CM | POA: Diagnosis present

## 2023-09-02 DIAGNOSIS — D62 Acute posthemorrhagic anemia: Secondary | ICD-10-CM | POA: Diagnosis present

## 2023-09-02 DIAGNOSIS — K253 Acute gastric ulcer without hemorrhage or perforation: Secondary | ICD-10-CM

## 2023-09-02 DIAGNOSIS — K254 Chronic or unspecified gastric ulcer with hemorrhage: Secondary | ICD-10-CM | POA: Diagnosis present

## 2023-09-02 DIAGNOSIS — K5900 Constipation, unspecified: Secondary | ICD-10-CM | POA: Diagnosis present

## 2023-09-02 DIAGNOSIS — Z87891 Personal history of nicotine dependence: Secondary | ICD-10-CM | POA: Diagnosis not present

## 2023-09-02 DIAGNOSIS — N400 Enlarged prostate without lower urinary tract symptoms: Secondary | ICD-10-CM | POA: Diagnosis present

## 2023-09-02 DIAGNOSIS — Z79899 Other long term (current) drug therapy: Secondary | ICD-10-CM | POA: Diagnosis not present

## 2023-09-02 DIAGNOSIS — Z7951 Long term (current) use of inhaled steroids: Secondary | ICD-10-CM | POA: Diagnosis not present

## 2023-09-02 DIAGNOSIS — Z8042 Family history of malignant neoplasm of prostate: Secondary | ICD-10-CM | POA: Diagnosis not present

## 2023-09-02 DIAGNOSIS — Z7982 Long term (current) use of aspirin: Secondary | ICD-10-CM | POA: Diagnosis not present

## 2023-09-02 DIAGNOSIS — K219 Gastro-esophageal reflux disease without esophagitis: Secondary | ICD-10-CM | POA: Diagnosis present

## 2023-09-02 DIAGNOSIS — I2510 Atherosclerotic heart disease of native coronary artery without angina pectoris: Secondary | ICD-10-CM | POA: Diagnosis present

## 2023-09-02 DIAGNOSIS — E785 Hyperlipidemia, unspecified: Secondary | ICD-10-CM | POA: Diagnosis present

## 2023-09-02 HISTORY — PX: ESOPHAGOGASTRODUODENOSCOPY: SHX5428

## 2023-09-02 HISTORY — DX: Gastric ulcer, unspecified as acute or chronic, without hemorrhage or perforation: K25.9

## 2023-09-02 LAB — BASIC METABOLIC PANEL WITH GFR
Anion gap: 6 (ref 5–15)
BUN: 34 mg/dL — ABNORMAL HIGH (ref 8–23)
CO2: 24 mmol/L (ref 22–32)
Calcium: 8.4 mg/dL — ABNORMAL LOW (ref 8.9–10.3)
Chloride: 108 mmol/L (ref 98–111)
Creatinine, Ser: 0.99 mg/dL (ref 0.61–1.24)
GFR, Estimated: >60 mL/min (ref >60)
Glucose, Bld: 86 mg/dL (ref 70–99)
Potassium: 4 mmol/L (ref 3.5–5.1)
Sodium: 138 mmol/L (ref 135–145)

## 2023-09-02 LAB — URINE DRUG SCREEN, QUALITATIVE (ARMC ONLY)
Amphetamines, Ur Screen: NOT DETECTED
Barbiturates, Ur Screen: NOT DETECTED
Benzodiazepine, Ur Scrn: NOT DETECTED
Cannabinoid 50 Ng, Ur ~~LOC~~: NOT DETECTED
Cocaine Metabolite,Ur ~~LOC~~: NOT DETECTED
MDMA (Ecstasy)Ur Screen: NOT DETECTED
Methadone Scn, Ur: NOT DETECTED
Opiate, Ur Screen: NOT DETECTED
Phencyclidine (PCP) Ur S: NOT DETECTED
Tricyclic, Ur Screen: POSITIVE — AB

## 2023-09-02 LAB — CBC
HCT: 26.1 % — ABNORMAL LOW (ref 39.0–52.0)
HCT: 26.9 % — ABNORMAL LOW (ref 39.0–52.0)
HCT: 23.5 % — ABNORMAL LOW (ref 39.0–52.0)
Hemoglobin: 9 g/dL — ABNORMAL LOW (ref 13.0–17.0)
Hemoglobin: 9.1 g/dL — ABNORMAL LOW (ref 13.0–17.0)
Hemoglobin: 8.1 g/dL — ABNORMAL LOW (ref 13.0–17.0)
MCH: 31.7 pg (ref 26.0–34.0)
MCH: 31 pg (ref 26.0–34.0)
MCH: 31.4 pg (ref 26.0–34.0)
MCHC: 34.5 g/dL (ref 30.0–36.0)
MCHC: 33.8 g/dL (ref 30.0–36.0)
MCHC: 34.5 g/dL (ref 30.0–36.0)
MCV: 91.9 fL (ref 80.0–100.0)
MCV: 91.5 fL (ref 80.0–100.0)
MCV: 91.1 fL (ref 80.0–100.0)
Platelets: 279 10*3/uL (ref 150–400)
Platelets: 288 10*3/uL (ref 150–400)
Platelets: 260 10*3/uL (ref 150–400)
RBC: 2.84 MIL/uL — ABNORMAL LOW (ref 4.22–5.81)
RBC: 2.94 MIL/uL — ABNORMAL LOW (ref 4.22–5.81)
RBC: 2.58 MIL/uL — ABNORMAL LOW (ref 4.22–5.81)
RDW: 13.2 % (ref 11.5–15.5)
RDW: 13.5 % (ref 11.5–15.5)
RDW: 13.5 % (ref 11.5–15.5)
WBC: 9.5 10*3/uL (ref 4.0–10.5)
WBC: 10 10*3/uL (ref 4.0–10.5)
WBC: 7.6 10*3/uL (ref 4.0–10.5)
nRBC: 0 % (ref 0.0–0.2)
nRBC: 0 % (ref 0.0–0.2)
nRBC: 0 % (ref 0.0–0.2)

## 2023-09-02 LAB — HIV ANTIBODY (ROUTINE TESTING W REFLEX): HIV Screen 4th Generation wRfx: NONREACTIVE

## 2023-09-02 SURGERY — EGD (ESOPHAGOGASTRODUODENOSCOPY)
Anesthesia: General

## 2023-09-02 MED ORDER — TIOTROPIUM BROMIDE MONOHYDRATE 18 MCG IN CAPS: 1.0000 | ORAL_CAPSULE | Freq: Every day | RESPIRATORY_TRACT | Status: DC

## 2023-09-02 MED ORDER — PROPOFOL 500 MG/50ML IV EMUL
INTRAVENOUS | Status: DC | PRN
  Administered 2023-09-02: 125 ug/kg/min via INTRAVENOUS

## 2023-09-02 MED ORDER — MOMETASONE FURO-FORMOTEROL FUM 100-5 MCG/ACT IN AERO
2.0000 | INHALATION_SPRAY | Freq: Two times a day (BID) | RESPIRATORY_TRACT | Status: DC
  Administered 2023-09-02 – 2023-09-04 (×5): 2 via RESPIRATORY_TRACT
  Filled 2023-09-02: qty 8.8

## 2023-09-02 MED ORDER — LIDOCAINE HCL (CARDIAC) PF 100 MG/5ML IV SOSY
PREFILLED_SYRINGE | INTRAVENOUS | Status: DC | PRN
  Administered 2023-09-02: 50 mg via INTRAVENOUS

## 2023-09-02 MED ORDER — AMITRIPTYLINE HCL 25 MG PO TABS
25.0000 mg | ORAL_TABLET | Freq: Every day | ORAL | Status: DC
  Administered 2023-09-02 – 2023-09-03 (×2): 25 mg via ORAL
  Filled 2023-09-02 (×2): qty 1

## 2023-09-02 MED ORDER — ONDANSETRON HCL 4 MG/2ML IJ SOLN: 4.0000 mg | Freq: Three times a day (TID) | INTRAMUSCULAR | Status: DC | PRN

## 2023-09-02 MED ORDER — UMECLIDINIUM BROMIDE 62.5 MCG/ACT IN AEPB
1.0000 | INHALATION_SPRAY | Freq: Every day | RESPIRATORY_TRACT | Status: DC
Start: 2023-09-02 — End: 2023-09-04
  Administered 2023-09-02 – 2023-09-04 (×3): 1 via RESPIRATORY_TRACT
  Filled 2023-09-02: qty 7

## 2023-09-02 MED ORDER — ATORVASTATIN CALCIUM 20 MG PO TABS
40.0000 mg | ORAL_TABLET | Freq: Every evening | ORAL | Status: DC
  Administered 2023-09-02 – 2023-09-03 (×2): 40 mg via ORAL
  Filled 2023-09-02 (×2): qty 2

## 2023-09-02 MED ORDER — GABAPENTIN 300 MG PO CAPS
300.0000 mg | ORAL_CAPSULE | Freq: Two times a day (BID) | ORAL | Status: DC
Start: 2023-09-02 — End: 2023-09-04
  Administered 2023-09-02 – 2023-09-04 (×5): 300 mg via ORAL
  Filled 2023-09-02 (×5): qty 1

## 2023-09-02 MED ORDER — ACETAMINOPHEN 325 MG PO TABS: 650.0000 mg | ORAL_TABLET | Freq: Four times a day (QID) | ORAL | Status: DC | PRN

## 2023-09-02 MED ORDER — FINASTERIDE 5 MG PO TABS
5.0000 mg | ORAL_TABLET | Freq: Every day | ORAL | Status: DC
  Administered 2023-09-02 – 2023-09-04 (×3): 5 mg via ORAL
  Filled 2023-09-02 (×3): qty 1

## 2023-09-02 MED ORDER — PROPOFOL 10 MG/ML IV BOLUS
INTRAVENOUS | Status: DC | PRN
  Administered 2023-09-02: 20 mg via INTRAVENOUS
  Administered 2023-09-02: 50 mg via INTRAVENOUS

## 2023-09-02 MED ORDER — HYDRALAZINE HCL 20 MG/ML IJ SOLN: 5.0000 mg | INTRAMUSCULAR | Status: DC | PRN

## 2023-09-02 MED ORDER — PHENYLEPHRINE 80 MCG/ML (10ML) SYRINGE FOR IV PUSH (FOR BLOOD PRESSURE SUPPORT)
PREFILLED_SYRINGE | INTRAVENOUS | Status: AC
  Filled 2023-09-02: qty 10

## 2023-09-02 MED ORDER — TAMSULOSIN HCL 0.4 MG PO CAPS
0.4000 mg | ORAL_CAPSULE | Freq: Every day | ORAL | Status: DC
  Administered 2023-09-02 – 2023-09-04 (×3): 0.4 mg via ORAL
  Filled 2023-09-02 (×3): qty 1

## 2023-09-02 MED ORDER — PHENYLEPHRINE 80 MCG/ML (10ML) SYRINGE FOR IV PUSH (FOR BLOOD PRESSURE SUPPORT)
PREFILLED_SYRINGE | INTRAVENOUS | Status: DC | PRN
  Administered 2023-09-02 (×3): 80 ug via INTRAVENOUS

## 2023-09-02 NOTE — Op Note (Signed)
 [facility] Gastroenterology Patient Name: Rodney Howe Procedure Date: 09/02/2023 12:16 PM MRN: [id number] Account #: [id number] Date of Birth: [birth year] Admit Type: Inpatient Age: 73 Room: [facility] ENDO ROOM 4 Gender: Male Note Status: Finalized Instrument Name: Upper Endoscope [id number] Procedure:             Upper GI endoscopy Indications:           Hematemesis, Melena Providers:             [name] MD, MD Referring MD:          [facility], MD (Referring MD) Medicines:             Propofol per Anesthesia Complications:         No immediate complications. Procedure:             Pre-Anesthesia Assessment:                        - Prior to the procedure, a History and Physical was                         performed, and patient medications and allergies were                         reviewed. The patient's tolerance of previous                         anesthesia was also reviewed. The risks and benefits                         of the procedure and the sedation options and risks                         were discussed with the patient. All questions were                         answered, and informed consent was obtained. Prior                         Anticoagulants: The patient has taken no anticoagulant                         or antiplatelet agents. ASA Grade Assessment: II - A                         patient with mild systemic disease. After reviewing                         the risks and benefits, the patient was deemed in                         satisfactory condition to undergo the procedure.                        After obtaining informed consent, the endoscope was                         passed under direct vision. Throughout the procedure,  the patient's blood pressure, pulse, and oxygen                         saturations were monitored continuously. The                         Endosonoscope was introduced  through the mouth, and                         advanced to the second part of duodenum. The upper GI                         endoscopy was accomplished without difficulty. The                         patient tolerated the procedure well. Findings:      A medium-sized hiatal hernia was present.      Two non-bleeding cratered gastric ulcers with no stigmata of bleeding       were found in the gastric antrum.      The examined duodenum was normal. Impression:            - Medium-sized hiatal hernia.                        - Non-bleeding gastric ulcers with no stigmata of                         bleeding.                        - Normal examined duodenum.                        - No specimens collected. Recommendation:        - Return patient to hospital ward for ongoing care.                        - Resume previous diet.                        - Continue present medications.                        - Healing gastric ulcers with low risk of rebleeding.                        - Use a proton pump inhibitor PO daily. Procedure Code(s):     --- Professional ---                        [phone], Esophagogastroduodenoscopy, flexible,                         transoral; diagnostic, including collection of                         specimen(s) by brushing or washing, when performed                         (separate procedure) Diagnosis Code(s):     --- Professional ---  K92.0, Hematemesis                        K92.1, Melena (includes Hematochezia)                        K25.9, Gastric ulcer, unspecified as acute or chronic,                         without hemorrhage or perforation CPT copyright 2022 [employer]. All rights reserved. The codes documented in this report are preliminary and upon coder review may  be revised to meet current compliance requirements. [name] MD, MD 09/02/2023 1:09:58 PM This report has been signed electronically. Number of Addenda:  0 Note Initiated On: 09/02/2023 12:16 PM Estimated Blood Loss:  Estimated blood loss: none.      [facility]

## 2023-09-02 NOTE — Transfer of Care (Signed)
 Immediate Anesthesia Transfer of Care Note  Patient: Rodney Howe  Procedure(s) Performed: EGD (ESOPHAGOGASTRODUODENOSCOPY)  Patient Location: PACU and Endoscopy Unit  Anesthesia Type:General  Level of Consciousness: sedated  Airway & Oxygen Therapy: Patient Spontanous Breathing  Post-op Assessment: Report given to RN and Post -op Vital signs reviewed and stable  Post vital signs: Reviewed and stable  Last Vitals:  Vitals Value Taken Time  BP 91/47 09/02/23 1316  Temp    Pulse 81 09/02/23 1316  Resp 25 09/02/23 1316  SpO2 93 % 09/02/23 1316    Last Pain:  Vitals:   09/02/23 1241  TempSrc: Temporal  PainSc: 0-No pain         Complications: No notable events documented.

## 2023-09-02 NOTE — Anesthesia Postprocedure Evaluation (Signed)
 Anesthesia Post Note  Patient: Rodney Howe  Procedure(s) Performed: EGD (ESOPHAGOGASTRODUODENOSCOPY)  Patient location during evaluation: Endoscopy Anesthesia Type: General Level of consciousness: awake and alert Pain management: pain level controlled Vital Signs Assessment: post-procedure vital signs reviewed and stable Respiratory status: spontaneous breathing, nonlabored ventilation, respiratory function stable and patient connected to nasal cannula oxygen Cardiovascular status: blood pressure returned to baseline and stable Postop Assessment: no apparent nausea or vomiting Anesthetic complications: no   No notable events documented.   Last Vitals:  Vitals:   09/02/23 1323 09/02/23 1328  BP: (!) 88/50 (!) 93/58  Pulse:    Resp:    Temp:    SpO2:      Last Pain:  Vitals:   09/02/23 1343  TempSrc:   PainSc: 0-No pain                 Lenard Simmer

## 2023-09-02 NOTE — Plan of Care (Signed)

## 2023-09-02 NOTE — Plan of Care (Signed)

## 2023-09-02 NOTE — Progress Notes (Signed)
 The patient had an EGD with a hiatal hernia and small antral clean-based ulcers without any visible vessels or active bleeding.  The ulcers are a low risk of rebleeding.  The patient should be sent home on a PPI.  Nothing further to do from a GI point of view.  I will sign off.  Please call if any further GI concerns or questions.  We would like to thank you for the opportunity to participate in the care of Rodney Howe.

## 2023-09-02 NOTE — Plan of Care (Signed)
   Problem: Education: Goal: Knowledge of General Education information will improve Description Including pain rating scale, medication(s)/side effects and non-pharmacologic comfort measures Outcome: Progressing   Problem: Clinical Measurements: Goal: Will remain free from infection Outcome: Progressing   Problem: Nutrition: Goal: Adequate nutrition will be maintained Outcome: Progressing   Problem: Coping: Goal: Level of anxiety will decrease Outcome: Progressing

## 2023-09-02 NOTE — TOC Initial Note (Signed)
 Transition of Care [facility]) - Initial/Assessment Note    Patient Details  Name: Rodney Howe MRN: [id number] Date of Birth: Nov 30, 1950  Transition of Care [facility] CM/SW Contact:    Marlowe Sax, RN Phone Number: 09/02/2023, 11:06 AM  Clinical Narrative:                 Transition of Care [facility]) - Inpatient Brief Assessment   Patient Details  Name: Rodney Howe MRN: [id number] Date of Birth: 07-08-1950  Transition of Care [facility]) CM/SW Contact:    Marlowe Sax, RN Phone Number: 09/02/2023, 11:06 AM   Clinical Narrative:  Added substance use resources to AVS< no other identified needs at this time for [facility]  Transition of Care Asessment: Insurance and Status: Insurance coverage has been reviewed Patient has primary care physician: Yes ([facility]  Provider Role General - General Practice  Office Phone [phone]) Home environment has been reviewed: independent at home Prior level of function:: independent Prior/Current Home Services: No current home services Social Drivers of Health Review: SDOH reviewed interventions complete (added Substance Use resources to AVS) Readmission risk has been reviewed: Yes Transition of care needs: no transition of care needs at this time         Patient Goals and CMS Choice            Expected Discharge Plan and Services                                              Prior Living Arrangements/Services                       Activities of Daily Living   ADL Screening (condition at time of admission) Independently performs ADLs?: Yes (appropriate for developmental age) Is the patient deaf or have difficulty hearing?: No Does the patient have difficulty seeing, even when wearing glasses/contacts?: No Does the patient have difficulty concentrating, remembering, or making decisions?: No  Permission Sought/Granted                  Emotional Assessment               Admission diagnosis:  Syncope and collapse [R55] GI bleeding [K92.2] Upper GI bleed [K92.2] Patient Active Problem List   Diagnosis Date Noted   GI bleeding 09/01/2023   Acute blood loss anemia 09/01/2023   Syncope 09/01/2023   Leukocytosis 09/01/2023   History of pelvic fracture 09/01/2023   Overweight (BMI 25.0-29.9) 09/01/2023   Alcohol abuse 09/01/2023   HLD (hyperlipidemia)    COPD (chronic obstructive pulmonary disease) (HCC)    GERD (gastroesophageal reflux disease)    BPH (benign prostatic hyperplasia)    Sepsis (HCC) 11/16/2017   Hyperlipidemia 06/24/2013   FHx: colon cancer 05/19/2013   Heartburn 05/19/2013   Tobacco abuse 05/19/2013   PCP:  [facility]:   [facility] - [place], [place] - [address] [place] [address] Phone: [phone] Fax: [phone]  [facility] [address]), [place] - [address] [place] ([place]) [place] [address] Phone: [phone] Fax: [phone]  [facility] [place] [facility] [id number] [place], [place] - [address] [place] [address] Phone: [phone] Fax: [phone]     Social Drivers of Health (SDOH)  Social History: SDOH Screenings   Food Insecurity: No Food Insecurity (09/01/2023)  Housing: Unknown (09/01/2023)  Transportation Needs: No Transportation Needs (09/01/2023)  Utilities: Not At Risk (09/01/2023)  Social Connections: Unknown (09/01/2023)  Tobacco Use: Medium Risk (09/01/2023)   SDOH Interventions:     Readmission Risk Interventions     No data to display

## 2023-09-02 NOTE — Progress Notes (Signed)
 Progress Note   Patient: Rodney Howe ZOX:[id number] DOB: 01-03-1951 DOA: 09/01/2023     0 DOS: the patient was seen and examined on 06-Sep-2023   Brief hospital course:  From HPI "Rodney Howe is a 73 y.o. male with medical history significant of alcohol abuse, former smoker, HLD, COPD, GERD, depression, BPH, who presents with abdominal pain, black stool, hematemesis, SOB, syncope.  Patient underwent EGD by gastroenterologist that showed findings of gastric ulcer.    Assessment and Plan:  GI bleeding and acute blood loss anemia: CTA of chest/abdomen/pelvis is negative for dissection. CTA showed diverticulosis without evidence of acute diverticulitis. Hgb dropped from 15.1 on 3/27 --> 12.8. pt occasionally taking aspirin 81 mg.   Continue pantoprazole Underwent EGD today EGD showed hiatal hernia with nonbleeding gastric ulcer with no stigmata of bleeding Continue to avoid NSAIDs and SQ heparin Case discussed with gastroenterologist Monitor CBC closely   Syncope:  Differential diagnosis include volume depletion and alcohol abuse.  No focal neuro deficits on physical examination.   CT head negative. Monitor orthostatic vitals   Leukocytosis: Resolved  COPD (chronic obstructive pulmonary disease) (HCC) -As needed bronchodilators and Mucinex   Alcohol abuse -CIWA protocol Patient was counseled about importance of quitting alcohol use   HLD (hyperlipidemia) Continue Lipitor  BPH (benign prostatic hyperplasia) -Proscar and Flomax   History of pelvic fracture and left hip pain: X-ray of left hip/pelvis redemonstration of an acute to subacute nondisplaced left inferior pubic rami fracture. Question cortical step-off at the left superior pubic rami/pubic symphysis that could represent a minimally displaced fracture. Continue needed morphine, Percocet, Tylenol for pain   Overweight (BMI 25.0-29.9): Body weight 79.4 kg, BMI 29.12 -Encourage losing weight -Exercise and  healthy diet   DVT ppx: SCD   Code Status: Full code    Family Communication:   No family at bedside   Disposition Plan:  Anticipate discharge back to previous environment   Consults: Gastroenterologist   Admission status and Level of care: Telemetry Medical:    for obs      Subjective:  Patient seen and examined at bedside this morning He denied any more hematemesis Yet to pass through today Denies chest pain abdominal pain Underwent EGD today EGD showed hiatal hernia with nonbleeding gastric ulcer with no stigmata of bleeding  Physical Exam:  General: Not in acute distress HEENT:       Eyes: PERRL, EOMI, no jaundice       ENT: No discharge from the ears and nose, no pharynx injection, no tonsillar enlargement.        Neck: No JVD, no bruit, no mass felt. Heme: No neck lymph node enlargement. Cardiac: S1/S2, RRR, No murmurs, No gallops or rubs. Respiratory: No rales, wheezing, rhonchi or rubs. GI: Soft, nondistended, has tenderness in upper abdomen,  no rebound pain, no organomegaly, BS present. GU: No hematuria Ext: No pitting leg edema bilaterally. 1+DP/PT pulse bilaterally. Musculoskeletal: No joint deformities, No joint redness or warmth, no limitation of ROM in spin. Skin: No rashes.  Neuro: Alert, oriented X3, cranial nerves II-XII grossly intact, moves all extremities normally. Muscle strength 5/5 in all extremities, sensation to light touch intact.  Psych: Patient is not psychotic, no suicidal or hemocidal ideation.   Labs on Admission: I have personally reviewed following labs and imaging studies  Vitals:   2023/09/06 1316 09-06-2023 1323 09-06-2023 1328 09-06-23 1518  BP:  (!) 88/50 (!) 93/58 100/69  Pulse: 81   86  Resp: (!) 25  16  Temp:    97.9 F (36.6 C)  TempSrc:      SpO2: 93%   94%    Data Reviewed: Above-mentioned imaging reviewed    Latest Ref Rng & Units 09/02/2023    3:39 PM 09/02/2023    8:26 AM 09/02/2023    1:42 AM  CBC  WBC 4.0 - 10.5 K/uL  7.6  10.0  9.5   Hemoglobin 13.0 - 17.0 g/dL 8.1  9.1  9.0   Hematocrit 39.0 - 52.0 % 23.5  26.9  26.1   Platelets 150 - 400 K/uL 260  288  279        Latest Ref Rng & Units 09/02/2023    8:26 AM 09/01/2023    6:13 PM 08/28/2023    3:37 PM  BMP  Glucose 70 - 99 mg/dL 86  409  811   BUN 8 - 23 mg/dL 34  49  9   Creatinine 0.61 - 1.24 mg/dL 9.14  7.82  9.56   Sodium 135 - 145 mmol/L 138  134  133   Potassium 3.5 - 5.1 mmol/L 4.0  4.8  3.3   Chloride 98 - 111 mmol/L 108  105  99   CO2 22 - 32 mmol/L 24  22  23    Calcium 8.9 - 10.3 mg/dL 8.4  8.6  9.0       Time spent: 55 minutes  Author: Loyce Dys, MD 09/02/2023 5:49 PM  For on call review [email or web address].

## 2023-09-02 NOTE — Anesthesia Preprocedure Evaluation (Signed)
 Anesthesia Evaluation  Patient identified by MRN, date of birth, ID band Patient awake    Reviewed: Allergy & Precautions, H&P , NPO status , Patient's Chart, lab work & pertinent test results  History of Anesthesia Complications Negative for: history of anesthetic complications  Airway Mallampati: II  TM Distance: >3 FB     Dental  (+) Chipped, Dental Advidsory Given   Pulmonary shortness of breath and with exertion, neg sleep apnea, COPD, neg recent URI, Patient abstained from smoking., former smoker   breath sounds clear to auscultation       Cardiovascular (-) hypertension(-) angina (-) Past MI and (-) Cardiac Stents negative cardio ROS (-) dysrhythmias  Rhythm:regular Rate:Normal     Neuro/Psych negative neurological ROS  negative psych ROS   GI/Hepatic ,GERD  ,,(+)     substance abuse (drinks "two fifths per week")  alcohol use  Endo/Other  negative endocrine ROS    Renal/GU negative Renal ROS  negative genitourinary   Musculoskeletal   Abdominal   Peds  Hematology  (+) Blood dyscrasia, anemia   Anesthesia Other Findings Past Medical History: No date: COPD (chronic obstructive pulmonary disease) (HCC)  Past Surgical History: No date: ANKLE SURGERY  BMI    Body Mass Index: 24.21 kg/m      Reproductive/Obstetrics negative OB ROS                             Anesthesia Physical Anesthesia Plan  ASA: 3  Anesthesia Plan: General   Post-op Pain Management:    Induction: Intravenous  PONV Risk Score and Plan: Propofol infusion, TIVA and Treatment may vary due to age or medical condition  Airway Management Planned: Nasal Cannula and Natural Airway  Additional Equipment:   Intra-op Plan:   Post-operative Plan:   Informed Consent: I have reviewed the patients History and Physical, chart, labs and discussed the procedure including the risks, benefits and alternatives  for the proposed anesthesia with the patient or authorized representative who has indicated his/her understanding and acceptance.     Dental Advisory Given  Plan Discussed with: Anesthesiologist, CRNA and Surgeon  Anesthesia Plan Comments:         Anesthesia Quick Evaluation

## 2023-09-02 NOTE — Progress Notes (Deleted)
 09/04/2023 10:33 PM   Rodney Howe 05-28-1951 [id number]  Referring provider: Center, [facility] [address] [place] [address] [place],  [place] [address]  Urological history: 1. BPH with LU TS -PSA (07/2023) 1.1 -cysto -enlarged prostate bilobar coaptation with a relatively short prostatic length 06/2020 -TRUS 25 cc 06/2020   2. Peyronie's disease -left mid shaft plaque palpated -one cycle of Xiaflex    3. ED -contributing factors of age, alcohol abuse, smoking, HLD, COPD and neuropathy -testosterone level (07/2023) 399 -managed with sildenafil 20 mg, on-demand-dosing  No chief complaint on file.  HPI: Rodney Howe is a 73 y.o. male who presents today for follow up.    Previous records reviewed.   At his visit on 07/30/2023, I PSS 0/2.  PVR 123 mL.  UA benign.  He drinks 7 shots of vodka daily.  Patient denies any modifying or aggravating factors.  Patient denies any recent UTI's, gross hematuria, dysuria or suprapubic/flank pain.  Patient denies any fevers, chills, nausea or vomiting.   He states he has been without his tamsulosin and finasteride for almost 2 years.  SHIM 11.  He has to take 5 the 20 mg sildenafil to achieve adequate erections.  Patient still having spontaneous erections.  He denies any pain or curvature with erections.   He was encouraged to discontinue alcohol.  He was instructed to continue the tamsulosin and finasteride.    I PSS ***    Score:  1-7 Mild 8-19 Moderate 20-35 Severe   SHIM ***      Score: 1-7 Severe ED 8-11 Moderate ED 12-16 Mild-Moderate ED 17-21 Mild ED 22-25 No ED   PMH: Past Medical History:  Diagnosis Date   BPH (benign prostatic hyperplasia)    COPD (chronic obstructive pulmonary disease) (HCC)    COPD (chronic obstructive pulmonary disease) (HCC)    GERD (gastroesophageal reflux disease)    HLD (hyperlipidemia)     Surgical History: Past Surgical History:  Procedure Laterality Date    ANKLE SURGERY     COLONOSCOPY WITH PROPOFOL N/A 09/19/2020   Procedure: COLONOSCOPY WITH PROPOFOL;  Surgeon: Wyline Mood, MD;  Location: [facility] ENDOSCOPY;  Service: Gastroenterology;  Laterality: N/A;    Home Medications:  Allergies as of 09/04/2023   No Known Allergies      Medication List      Notice   This visit is during an admission. Changes to the med list made in this visit will be reflected in the After Visit Summary of the admission.     Allergies: No Known Allergies  Family History: Family History  Problem Relation Age of Onset   Heart attack Mother    Prostate cancer Father     Social History:  reports that he quit smoking about 3 years ago. His smoking use included cigarettes. He started smoking about 52 years ago. He has a 98 pack-year smoking history. He has never used smokeless tobacco. He reports current alcohol use of about 36.0 standard drinks of alcohol per week. He reports that he does not currently use drugs.  ROS: Pertinent ROS in HPI  Physical Exam: There were no vitals taken for this visit.  Constitutional:  Well nourished. Alert and oriented, No acute distress. HEENT: [place] AT, moist mucus membranes.  Trachea midline, no masses. Cardiovascular: No clubbing, cyanosis, or edema. Respiratory: Normal respiratory effort, no increased work of breathing. GI: Abdomen is soft, non tender, non distended, no abdominal masses. Liver and spleen not palpable.  No hernias  appreciated.  Stool sample for occult testing is not indicated.   GU: No CVA tenderness.  No bladder fullness or masses.  Patient with circumcised/uncircumcised phallus. ***Foreskin easily retracted***  Urethral meatus is patent.  No penile discharge. No penile lesions or rashes. Scrotum without lesions, cysts, rashes and/or edema.  Testicles are located scrotally bilaterally. No masses are appreciated in the testicles. Left and right epididymis are normal. Rectal: Patient with  normal sphincter tone. Anus  and perineum without scarring or rashes. No rectal masses are appreciated. Prostate is approximately *** grams, *** nodules are appreciated. Seminal vesicles are normal. Skin: No rashes, bruises or suspicious lesions. Lymph: No cervical or inguinal adenopathy. Neurologic: Grossly intact, no focal deficits, moving all 4 extremities. Psychiatric: Normal mood and affect.   Laboratory Data: CBC    Component Value Date/Time   WBC 7.6 09/02/2023 1539   RBC 2.58 (L) 09/02/2023 1539   HGB 8.1 (L) 09/02/2023 1539   HCT 23.5 (L) 09/02/2023 1539   PLT 260 09/02/2023 1539   MCV 91.1 09/02/2023 1539   MCH 31.4 09/02/2023 1539   MCHC 34.5 09/02/2023 1539   RDW 13.5 09/02/2023 1539   LYMPHSABS 2.7 09/01/2023 1551   MONOABS 1.1 (H) 09/01/2023 1551   EOSABS 0.0 09/01/2023 1551   BASOSABS 0.1 09/01/2023 1551    BMET    Component Value Date/Time   NA 138 09/02/2023 0826   K 4.0 09/02/2023 0826   CL 108 09/02/2023 0826   CO2 24 09/02/2023 0826   GLUCOSE 86 09/02/2023 0826   BUN 34 (H) 09/02/2023 0826   CREATININE 0.99 09/02/2023 0826   CALCIUM 8.4 (L) 09/02/2023 0826   GFRNONAA >60 09/02/2023 0826    Urinalysis See EPIC and HPI  I have reviewed the labs.   Pertinent Imaging: *** Assessment & Plan:    1. BPH with LUTS -PSA pending -DRE benign  -UA benign  -PVR < 300 cc  --continue conservative management, avoiding bladder irritants and timed voiding's -Continue tamsulosin 0.4 mg daily and finasteride 5 mg daily  2. Erectile dysfunction -Start sildenafil 100 mg on demand dosing -Testosterone level pending  3. Peyronie's diease -had one cycle of Xiaflex  -Curvature resolved  4. Alcohol abuse -Explained to him the dangers of excessive alcohol consumption and encouraged him to quit -He stated he had 2 shots of vodka before his office visit with the today, I am not hopeful that he will abstain from alcohol at this time -Continue to encourage abstinence from Etoh   No  follow-ups on file.  These notes generated with voice recognition software. I apologize for typographical errors.  Cloretta Ned  [facility] [address] [place] [address]  [address] [place], [place] [address] [phone]

## 2023-09-02 NOTE — Discharge Instructions (Signed)

## 2023-09-02 NOTE — Consult Note (Signed)
 Rodney Minium, MD [facility]  [address], [address] [place], [place] [address] Phone: [phone] Fax : [phone]  Consultation  Referring Provider:     Dr. Clyde Lundborg Primary Care Physician:  [facility] Primary Gastroenterologist:  Dr. Tobi Bastos         Reason for Consultation:     Melena with hematemesis  Date of Admission:  09/01/2023 Date of Consultation:  09/02/2023         HPI:   Rodney Howe is a 73 y.o. male who was admitted with shortness of breath and abdominal pain.  The patient has a history of GERD COPD and came in with syncope.  He reports that he has had 3 episodes of syncope in the last week prior to being admitted.  The patient had a fall 1 week ago with a left hip and right shoulder injury and was found to have a nondisplaced left inferior pubic ramus fracture.  He denies ever having any history of GI bleeding. The patient tells me today that he does not have a history of alcohol abuse but on his admission there is a history of significant alcohol abuse.  He also reported that he had hematemesis to the hospitalist who admitted him.  The patient's labs have shown:  Component     Latest Ref Rng 08/28/2023 09/01/2023 09/02/2023  RBC     4.22 - 5.81 MIL/uL 4.87  3.29 (L)  2.94 (L)   RBC       4.18 (L)  2.84 (L)   Hemoglobin     13.0 - 17.0 g/dL 78.4  69.6 (L)  9.1 (L)   Hemoglobin       12.8 (L)  9.0 (L)    The patient also had a CT scan of the chest abdomen pelvis that showed:  IMPRESSION: 1. Normal contour and caliber of the thoracic and abdominal aorta. No evidence of aneurysm, dissection, or other acute aortic pathology. Mild aortic atherosclerosis. 2. Mild pulmonary fibrosis in a pattern with slight apical to basal gradient, featuring irregular peripheral interstitial opacity, without evidence significant subpleural bronchiolectasis or honeycombing. Findings are indeterminate for UIP by ATS pulmonary fibrosis criteria. 3. Coronary artery  disease. 4. Moderate hiatal hernia with intrathoracic position of the gastric fundus. 5. Hepatic steatosis. 6. Descending and sigmoid diverticulosis without evidence of acute diverticulitis. 7. Partially imaged left hydrocele.  The patient's liver enzymes have been historically normal.   Past Medical History:  Diagnosis Date   BPH (benign prostatic hyperplasia)    COPD (chronic obstructive pulmonary disease) (HCC)    COPD (chronic obstructive pulmonary disease) (HCC)    GERD (gastroesophageal reflux disease)    HLD (hyperlipidemia)     Past Surgical History:  Procedure Laterality Date   ANKLE SURGERY     COLONOSCOPY WITH PROPOFOL N/A 09/19/2020   Procedure: COLONOSCOPY WITH PROPOFOL;  Surgeon: Wyline Mood, MD;  Location: [facility] ENDOSCOPY;  Service: Gastroenterology;  Laterality: N/A;    Prior to Admission medications   Medication Sig Start Date End Date Taking? Authorizing Provider  albuterol (VENTOLIN HFA) 108 (90 Base) MCG/ACT inhaler Inhale 2 puffs into the lungs every 6 (six) hours as needed for wheezing or shortness of breath. 11/17/17   Houston Siren, MD  amitriptyline (ELAVIL) 25 MG tablet Take by mouth. 02/05/12   [provider]  aspirin EC 81 MG tablet Take 81 mg by mouth daily.    [provider]  atorvastatin (LIPITOR) 40 MG tablet  Take 40 mg by mouth every evening.    [provider]  budesonide-formoterol (SYMBICORT) 80-4.5 MCG/ACT inhaler Inhale 2 puffs into the lungs 2 (two) times daily. 11/17/17   Houston Siren, MD  finasteride (PROSCAR) 5 MG tablet Take 1 tablet (5 mg total) by mouth daily. 07/30/23   Michiel Cowboy A, PA-C  gabapentin (NEURONTIN) 300 MG capsule Take 300 mg by mouth 2 (two) times daily.    [provider]  naproxen (NAPROSYN) 500 MG tablet Take 1 tablet (500 mg total) by mouth 2 (two) times daily with a meal. 08/28/23   Sharman Cheek, MD  omeprazole (PRILOSEC) 20 MG capsule Take 20 mg by mouth daily.     [provider]  sildenafil (VIAGRA) 100 MG tablet Take 1 tablet (100 mg total) by mouth daily as needed for erectile dysfunction. 07/30/23   Michiel Cowboy A, PA-C  SPIRIVA HANDIHALER 18 MCG inhalation capsule 1 capsule daily. 08/08/20   [provider]  tamsulosin (FLOMAX) 0.4 MG CAPS capsule Take 1 capsule (0.4 mg total) by mouth daily. 07/30/23   Harle Battiest, PA-C  XIAFLEX 0.9 MG SOLR  12/14/20   [provider]    Family History  Problem Relation Age of Onset   Heart attack Mother    Prostate cancer Father      Social History   Tobacco Use   Smoking status: Former    Current packs/day: 0.00    Average packs/day: 2.0 packs/day for 49.0 years (98.0 ttl pk-yrs)    Types: Cigarettes    Start date: 07/12/1971    Quit date: 07/11/2020    Years since quitting: 3.1   Smokeless tobacco: Never  Substance Use Topics   Alcohol use: Yes    Alcohol/week: 36.0 standard drinks of alcohol    Types: 36 Cans of beer per week   Drug use: Not Currently    Allergies as of 09/01/2023   (No Known Allergies)    Review of Systems:    All systems reviewed and negative except where noted in HPI.   Physical Exam:  Vital signs in last 24 hours: Temp:  [97.8 F (36.6 C)-98.4 F (36.9 C)] 98.1 F (36.7 C) (04/01 0952) Pulse Rate:  [79-126] 83 (04/01 0952) Resp:  [16-31] 16 (04/01 0952) BP: (92-149)/(74-87) 111/74 (04/01 0952) SpO2:  [93 %-100 %] 98 % (04/01 0952) Last BM Date : 08/30/23 General:   Pleasant, cooperative in NAD Head:  Normocephalic and atraumatic. Eyes:   No icterus.   Conjunctiva pink. PERRLA. Ears:  Normal auditory acuity. Neck:  Supple; no masses or thyroidomegaly Lungs: Respirations even and unlabored. Lungs clear to auscultation bilaterally.   No wheezes, crackles, or rhonchi.  Heart:  Regular rate and rhythm;  Without murmur, clicks, rubs or gallops Abdomen:  Soft, nondistended, nontender. Normal bowel sounds. No appreciable masses or  hepatomegaly.  No rebound or guarding.  Rectal:  Not performed. Msk:  Symmetrical without gross deformities.    Extremities:  Without edema, cyanosis or clubbing. Neurologic:  Alert and oriented x3;  grossly normal neurologically. Skin:  Intact without significant lesions or rashes. Cervical Nodes:  No significant cervical adenopathy. Psych:  Alert and cooperative. Normal affect.  LAB RESULTS: Recent Labs    09/01/23 2020 09/02/23 0142 09/02/23 0826  WBC 10.7* 9.5 10.0  HGB 10.4* 9.0* 9.1*  HCT 31.0* 26.1* 26.9*  PLT 331 279 288   BMET Recent Labs    09/01/23 1813 09/02/23 0826  NA 134* 138  K 4.8 4.0  CL 105 108  CO2 22 24  GLUCOSE 113* 86  BUN 49* 34*  CREATININE 1.06 0.99  CALCIUM 8.6* 8.4*   LFT Recent Labs    09/01/23 1813  PROT 5.7*  ALBUMIN 2.8*  AST 24  ALT 25  ALKPHOS 45  BILITOT 0.6   PT/INR Recent Labs    09/01/23 2020  LABPROT 14.6  INR 1.1    STUDIES: DG HIP UNILAT WITH PELVIS 2-3 VIEWS LEFT Result Date: 09/01/2023 CLINICAL DATA:  Something broken inside per patient. EXAM: DG HIP (WITH OR WITHOUT PELVIS) 2-3V LEFT COMPARISON:  X-ray pelvis 08/28/2023 FINDINGS: Limited evaluation due to overlapping osseous structures and overlying soft tissues. Redemonstration of an acute to subacute nondisplaced left inferior pubic rami fracture. Question cortical step-off at the left superior pubic rami/pubic symphysis. No definite new acute displaced fracture or dislocation of the left hip. No acute displaced fracture or dislocation of right hip on frontal view. No pelvic diastasis. There is no evidence of arthropathy or other focal bone abnormality. Severe vascular calcifications. Excreted intravenous contrast opacifies the urinary bladder lumen. IMPRESSION: 1. Redemonstration of an acute to subacute nondisplaced left inferior pubic rami fracture. 2. Question cortical step-off at the left superior pubic rami/pubic symphysis that could represent a minimally  displaced fracture. Electronically Signed   By: Tish Frederickson M.D.   On: 09/01/2023 22:16   DG Shoulder Right Result Date: 09/01/2023 CLINICAL DATA:  [id number] Right shoulder pain [id number] EXAM: RIGHT SHOULDER - 2+ VIEW COMPARISON:  None Available. FINDINGS: There is no evidence of fracture or dislocation. Acromioclavicular joint degenerative changes. Soft tissues are unremarkable. IMPRESSION: No acute displaced fracture or dislocation. Electronically Signed   By: Tish Frederickson M.D.   On: 09/01/2023 22:13   CT HEAD WO CONTRAST ( ) Result Date: 09/01/2023 CLINICAL DATA:  Syncope/presyncope, cerebrovascular cause suspected. Diaphoretic on exam. EXAM: CT HEAD WITHOUT CONTRAST TECHNIQUE: Contiguous axial images were obtained from the base of the skull through the vertex without intravenous contrast. RADIATION DOSE REDUCTION: This exam was performed according to the departmental dose-optimization program which includes automated exposure control, adjustment of the mA and/or kV according to patient size and/or use of iterative reconstruction technique. COMPARISON:  Head CT 07/12/2020 FINDINGS: Brain: There are mild features of cerebral atrophy and small-vessel disease, unremarkable cerebellum and brainstem. No cortical based acute infarct, hemorrhage, mass or mass effect are seen. There is a partially empty sella. This was seen previously. The ventricles are normal in size and position. The basal cisterns are clear. Vascular: Scattered calcific plaques both distal vertebral arteries and both siphons. No hyperdense central vessel is seen. Skull: Negative for fractures or focal lesions. No visible scalp hematoma. Sinuses/Orbits: 1 cm retention cyst or polyp left maxillary sinus, mild membrane thickening in the left frontal sinus. Other sinuses, bilateral mastoid air cells, and middle ears are clear. 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 Carious maxillary molar dentition  bilaterally. Follow-up with a dentist is recommended. IMPRESSION: 1. No acute intracranial CT findings or interval changes. 2. Mild atrophy and small-vessel disease. 3. Partially empty sella. 4. Carious maxillary molar dentition bilaterally. Follow-up with a dentist is recommended. Electronically Signed   By: Almira Bar M.D.   On: 09/01/2023 21:58   CT Angio Chest/Abd/Pel for Dissection W and/or Wo Contrast Result Date: 09/01/2023 CLINICAL DATA:  Midline chest and abdominal pain radiating to back, multiple syncopal episodes EXAM: CT ANGIOGRAPHY CHEST, ABDOMEN AND PELVIS  TECHNIQUE: Non-contrast CT of the chest was initially obtained. Multidetector CT imaging through the chest, abdomen and pelvis was performed using the standard protocol during bolus administration of intravenous contrast. Multiplanar reconstructed images and MIPs were obtained and reviewed to evaluate the vascular anatomy. RADIATION DOSE REDUCTION: This exam was performed according to the departmental dose-optimization program which includes automated exposure control, adjustment of the mA and/or kV according to patient size and/or use of iterative reconstruction technique. CONTRAST:  OMNIPAQUE IOHEXOL 350 MG/ML SOLN COMPARISON:  CT chest, 08/22/2022 FINDINGS: CTA CHEST FINDINGS VASCULAR Aorta: Satisfactory opacification of the aorta. Normal contour and caliber of the thoracic aorta. No evidence of aneurysm, dissection, or other acute aortic pathology. Mild aortic atherosclerosis. Cardiovascular: No evidence of pulmonary embolism on limited non-tailored examination. Normal heart size. Three-vessel coronary artery calcifications. No pericardial effusion. Review of the MIP images confirms the above findings. NON VASCULAR Mediastinum/Nodes: No enlarged mediastinal, hilar, or axillary lymph nodes. Moderate hiatal hernia with intrathoracic position of the gastric fundus. Thyroid gland, trachea, and esophagus demonstrate no significant  findings. Lungs/Pleura: Mild pulmonary fibrosis in a pattern with slight apical to basal gradient, featuring irregular peripheral interstitial opacity, without evidence significant subpleural bronchiolectasis or honeycombing. No pleural effusion or pneumothorax. Musculoskeletal: No chest wall abnormality. No acute osseous findings. Review of the MIP images confirms the above findings. CTA ABDOMEN AND PELVIS FINDINGS VASCULAR Normal contour and caliber of the abdominal aorta. No evidence of aneurysm, dissection, or other acute aortic pathology. Standard branching pattern of the abdominal aorta with solitary bilateral renal arteries. Mild aortic atherosclerosis. Review of the MIP images confirms the above findings. NON-VASCULAR Hepatobiliary: No solid liver abnormality is seen. Hepatic steatosis. No gallstones, gallbladder wall thickening, or biliary dilatation. Pancreas: Unremarkable. No pancreatic ductal dilatation or surrounding inflammatory changes. Spleen: Normal in size without significant abnormality. Adrenals/Urinary Tract: Adrenal glands are unremarkable. Kidneys are normal, without renal calculi, solid lesion, or hydronephrosis. Bladder is unremarkable. Pancolonic diverticulosis. Stomach/Bowel: Stomach is within normal limits. Appendix appears normal. No evidence of bowel wall thickening, distention, or inflammatory changes. Lymphatic: No enlarged abdominal or pelvic lymph nodes. Reproductive: No mass or other significant abnormality. Partially imaged left hydrocele. Other: No abdominal wall hernia or abnormality. No ascites. Musculoskeletal: No acute osseous findings. IMPRESSION: 1. Normal contour and caliber of the thoracic and abdominal aorta. No evidence of aneurysm, dissection, or other acute aortic pathology. Mild aortic atherosclerosis. 2. Mild pulmonary fibrosis in a pattern with slight apical to basal gradient, featuring irregular peripheral interstitial opacity, without evidence significant  subpleural bronchiolectasis or honeycombing. Findings are indeterminate for UIP by ATS pulmonary fibrosis criteria. 3. Coronary artery disease. 4. Moderate hiatal hernia with intrathoracic position of the gastric fundus. 5. Hepatic steatosis. 6. Descending and sigmoid diverticulosis without evidence of acute diverticulitis. 7. Partially imaged left hydrocele. Aortic Atherosclerosis (ICD10-I70.0). Electronically Signed   By: Jearld Lesch M.D.   On: 09/01/2023 17:04   DG Chest Portable 1 View Result Date: 09/01/2023 CLINICAL DATA:  Shortness of breath, chest pain. EXAM: PORTABLE CHEST 1 VIEW COMPARISON:  09/18/2019 and CT chest 08/22/2022. FINDINGS: Trachea is midline. Heart size is accentuated by AP technique and somewhat low lung volumes. Mild bibasilar scarring. No airspace consolidation or pleural fluid. IMPRESSION: No acute findings. Electronically Signed   By: Leanna Battles M.D.   On: 09/01/2023 16:51      Impression / Plan:   Assessment: Principal Problem:   GI bleeding Active Problems:   HLD (hyperlipidemia)   COPD (chronic obstructive pulmonary  disease) (HCC)   BPH (benign prostatic hyperplasia)   Acute blood loss anemia   Syncope   Leukocytosis   History of pelvic fracture   Overweight (BMI 25.0-29.9)   Alcohol abuse   Rodney Howe is a 73 y.o. y/o male with with a history of melena and a drop in his hemoglobin with a report of hematemesis.  The patient states that his abdominal pain has improved and he is not having any further vomiting.  He has never had GI bleeding before and had a colonoscopy in 2022 by Dr. Tobi Bastos.  Plan:  The patient will be set up for an upper endoscopy for today.  Patient has been explained the plan and agrees with it.  He has been n.p.o. overnight.  PPI IV twice daily  Continue serial CBCs and transfuse PRN Avoid NSAIDs Maintain 2 large-bore IV lines Please page GI with any acute hemodynamic changes, or signs of active GI  bleeding   Thank you for involving me in the care of this patient.      LOS: 0 days   Rodney Minium, MD, MD. Clementeen Graham 09/02/2023, 11:59 AM,  Pager [phone] 7am-5pm  Check AMION for 5pm -7am coverage and on weekends   Note: This dictation was prepared with Dragon dictation along with smaller phrase technology. Any transcriptional errors that result from this process are unintentional.

## 2023-09-03 ENCOUNTER — Inpatient Hospital Stay

## 2023-09-03 ENCOUNTER — Encounter: Payer: Self-pay | Admitting: Gastroenterology

## 2023-09-03 DIAGNOSIS — K922 Gastrointestinal hemorrhage, unspecified: Principal | ICD-10-CM

## 2023-09-03 HISTORY — DX: Gastrointestinal hemorrhage, unspecified: K92.2

## 2023-09-03 LAB — CBC WITH DIFFERENTIAL/PLATELET
Abs Immature Granulocytes: 0.04 10*3/uL (ref 0.00–0.07)
Basophils Absolute: 0 10*3/uL (ref 0.0–0.1)
Basophils Relative: 1 %
Eosinophils Absolute: 0.1 10*3/uL (ref 0.0–0.5)
Eosinophils Relative: 2 %
HCT: 23.1 % — ABNORMAL LOW (ref 39.0–52.0)
Hemoglobin: 7.8 g/dL — ABNORMAL LOW (ref 13.0–17.0)
Immature Granulocytes: 1 %
Lymphocytes Relative: 33 %
Lymphs Abs: 2.6 10*3/uL (ref 0.7–4.0)
MCH: 31.3 pg (ref 26.0–34.0)
MCHC: 33.8 g/dL (ref 30.0–36.0)
MCV: 92.8 fL (ref 80.0–100.0)
Monocytes Absolute: 0.7 10*3/uL (ref 0.1–1.0)
Monocytes Relative: 8 %
Neutro Abs: 4.6 10*3/uL (ref 1.7–7.7)
Neutrophils Relative %: 55 %
Platelets: 263 10*3/uL (ref 150–400)
RBC: 2.49 MIL/uL — ABNORMAL LOW (ref 4.22–5.81)
RDW: 13.4 % (ref 11.5–15.5)
WBC: 8.1 10*3/uL (ref 4.0–10.5)
nRBC: 0 % (ref 0.0–0.2)

## 2023-09-03 LAB — BASIC METABOLIC PANEL WITH GFR
Anion gap: 7 (ref 5–15)
BUN: 25 mg/dL — ABNORMAL HIGH (ref 8–23)
CO2: 24 mmol/L (ref 22–32)
Calcium: 8 mg/dL — ABNORMAL LOW (ref 8.9–10.3)
Chloride: 101 mmol/L (ref 98–111)
Creatinine, Ser: 1.01 mg/dL (ref 0.61–1.24)
GFR, Estimated: >60 mL/min (ref >60)
Glucose, Bld: 102 mg/dL — ABNORMAL HIGH (ref 70–99)
Potassium: 3.7 mmol/L (ref 3.5–5.1)
Sodium: 132 mmol/L — ABNORMAL LOW (ref 135–145)

## 2023-09-03 MED ORDER — SODIUM CHLORIDE 0.9 % IV BOLUS
2000.0000 mL | Freq: Once | INTRAVENOUS | Status: AC
  Administered 2023-09-03: 1000 mL via INTRAVENOUS

## 2023-09-03 NOTE — Evaluation (Signed)
 Physical Therapy Evaluation Patient Details Name: Rodney Howe MRN: [id number] DOB: 09/23/1950 Today's Date: 09/03/2023  History of Present Illness  73 y/o presented to ED on 09/01/23 for SOB and stomach pain. Seen in ED on 3/27 after falling backwards off boat onto concrete. Sustained acute to subacute nondisplaced L inferior pubic rami fracture. Admitted for GI bleed and acute blood loss anemia. PMH: alcohol abuse, former smoker, COPD, depression  Clinical Impression  Patient admitted with the above. PTA, patient lives with friend and reports he is typically independent with no AD and currently working. Patient presents with weakness, impaired balance, and decreased activity tolerance. Required CGA for OOB mobility and ambulation 50' with no AD but demonstrating decreased stance time on L due to discomfort. SpO2 dropping to 84% on RA. Donned 2L on return to room with increase back to >90%. Patient will benefit from skilled PT services during acute stay to address listed deficits. Patient will benefit from ongoing therapy at discharge to maximize functional independence and safety.       If plan is discharge home, recommend the following: A little help with walking and/or transfers;A little help with bathing/dressing/bathroom;Assistance with cooking/housework;Assist for transportation;Help with stairs or ramp for entrance   Can travel by private vehicle        Equipment Recommendations Rolling Taray Normoyle (2 wheels)  Recommendations for Other Services       Functional Status Assessment Patient has had a recent decline in their functional status and demonstrates the ability to make significant improvements in function in a reasonable and predictable amount of time.     Precautions / Restrictions Precautions Precautions: Fall Recall of Precautions/Restrictions: Intact Precaution/Restrictions Comments: O2 Restrictions Weight Bearing Restrictions Per Provider Order: No      Mobility   Bed Mobility Overal bed mobility: Modified Independent                  Transfers Overall transfer level: Needs assistance Equipment used: None Transfers: Sit to/from Stand Sit to Stand: Contact guard assist                Ambulation/Gait Ambulation/Gait assistance: Contact guard assist Gait Distance (Feet): 50 Feet Assistive device: None Gait Pattern/deviations: Decreased stance time - left Gait velocity: decreased     General Gait Details: decreased stance time notable on L due to discomfort of L hip/groin with known pelivc fracture. CGA for safety. SpO2 dropping 84% on RA with mobility  Stairs            Wheelchair Mobility     Tilt Bed    Modified Rankin (Stroke Patients Only)       Balance Overall balance assessment: Needs assistance Sitting-balance support: No upper extremity supported, Feet supported Sitting balance-Leahy Scale: Good     Standing balance support: No upper extremity supported, During functional activity Standing balance-Leahy Scale: Fair                               Pertinent Vitals/Pain Pain Assessment Pain Assessment: Faces Faces Pain Scale: Hurts little more Pain Location: L shoulder; L LE with movement Pain Descriptors / Indicators: Guarding, Discomfort Pain Intervention(s): Limited activity within patient's tolerance, Monitored during session, Repositioned    Home Living Family/patient expects to be discharged to:: Private residence Living Arrangements: Other relatives Available Help at Discharge: Family Type of Home: House Home Access: Stairs to enter Entrance Stairs-Rails: None Entrance Stairs-Number of Steps: 4   Home  Layout: One level Home Equipment: None      Prior Function Prior Level of Function : Independent/Modified Independent;Working/employed;Driving                     Extremity/Trunk Assessment   Upper Extremity Assessment Upper Extremity Assessment: Defer to OT  evaluation    Lower Extremity Assessment Lower Extremity Assessment: Generalized weakness    Cervical / Trunk Assessment Cervical / Trunk Assessment: Normal  Communication   Communication Communication: No apparent difficulties    Cognition Arousal: Alert Behavior During Therapy: WFL for tasks assessed/performed   PT - Cognitive impairments: No family/caregiver present to determine baseline                       PT - Cognition Comments: overall WFL; slight decrease in safety awareness Following commands: Intact       Cueing       General Comments General comments (skin integrity, edema, etc.): SpO2 dropping to 84% on RA with mobility. Donned 2L O2 on return to room with slow increase back to >90%    Exercises     Assessment/Plan    PT Assessment Patient needs continued PT services  PT Problem List Decreased strength;Decreased activity tolerance;Decreased balance;Decreased mobility;Decreased knowledge of use of DME;Decreased safety awareness       PT Treatment Interventions DME instruction;Gait training;Functional mobility training;Therapeutic activities;Therapeutic exercise;Balance training;Stair training;Patient/family education    PT Goals (Current goals can be found in the Care Plan section)  Acute Rehab PT Goals Patient Stated Goal: to go home PT Goal Formulation: With patient Time For Goal Achievement: 09/17/23 Potential to Achieve Goals: Good    Frequency Min 2X/week     Co-evaluation               AM-PAC PT "6 Clicks" Mobility  Outcome Measure Help needed turning from your back to your side while in a flat bed without using bedrails?: None Help needed moving from lying on your back to sitting on the side of a flat bed without using bedrails?: None Help needed moving to and from a bed to a chair (including a wheelchair)?: A Little Help needed standing up from a chair using your arms (e.g., wheelchair or bedside chair)?: A Little Help  needed to walk in hospital room?: A Little Help needed climbing 3-5 steps with a railing? : A Little 6 Click Score: 20    End of Session Equipment Utilized During Treatment: Oxygen Activity Tolerance: Patient tolerated treatment well Patient left: in bed;with call bell/phone within reach;with bed alarm set;with nursing/sitter in room Nurse Communication: Mobility status PT Visit Diagnosis: Muscle weakness (generalized) (M62.81);Unsteadiness on feet (R26.81)    Time: 1610-9604 PT Time Calculation (min) (ACUTE ONLY): 15 min   Charges:   PT Evaluation $PT Eval Moderate Complexity: 1 Mod   PT General Charges $$ ACUTE PT VISIT: 1 Visit         Maylon Peppers, PT, DPT Physical Therapist - [facility]  [facility]   Canyon Lohr A Saban Heinlen 09/03/2023, 10:42 AM

## 2023-09-03 NOTE — Evaluation (Signed)
 Occupational Therapy Evaluation Patient Details Name: Rodney Howe MRN: [id number] DOB: [birth year] Today's Date: 09/03/2023   History of Present Illness   73 y/o presented to ED on 09/01/23 for SOB and stomach pain. Seen in ED on 3/27 after falling backwards off boat onto concrete. Sustained acute to subacute nondisplaced L inferior pubic rami fracture. Admitted for GI bleed and acute blood loss anemia. PMH: alcohol abuse, former smoker, COPD, depression     Clinical Impressions PTA, pt reports being IND in ADLs/mobility including working and driving. On OT eval, pt performs bed mobility MOD I with increased time and use of bed rails, rises from regular bed height without UE support and no physical assist, but is unable to tolerate standing or further transfers due to c/o dizziness. See below for orthostatic vitals assessed. Pt received on 2L/min O2 via [place] with SpO2 96-97% with activity, decreased down to 1L/min with SpO2 95%>. Pt with c/o R shoulder spasms/cramps/pain; RN in room for meds. Further activity deferred due to positive orthostatics with pt symptomatic, MD alerted via secure chat. Anticipate pt requiring up to MIN A for LB ADLs and transfers. Of note, pt ambulated 50 ft with PT earlier, no AD. Pt would benefit from skilled OT services to address noted impairments and functional limitations (see below for any additional details) in order to maximize safety and independence while minimizing falls risk and caregiver burden. Anticipate the need for follow up [facility] OT services upon acute hospital DC.   Orthostatic vitals assessed: Lying 96/67, HR 92 Sitting 105/65, HR 95 Standing 0 mins 76/56, HR 101 & symptomatic  Lying 96/67 HR 90       If plan is discharge home, recommend the following:   A little help with walking and/or transfers;A little help with bathing/dressing/bathroom;Assistance with cooking/housework     Functional Status Assessment   Patient has had a recent  decline in their functional status and demonstrates the ability to make significant improvements in function in a reasonable and predictable amount of time.     Equipment Recommendations   None recommended by OT      Precautions/Restrictions   Precautions Precautions: Fall Recall of Precautions/Restrictions: Intact Precaution/Restrictions Comments: O2, orthostatic Restrictions Weight Bearing Restrictions Per Provider Order: No     Mobility Bed Mobility Overal bed mobility: Modified Independent                  Transfers Overall transfer level: Needs assistance Equipment used: None Transfers: Sit to/from Stand Sit to Stand: Contact guard assist           General transfer comment: unable to perform transfer due to pt reporting dizziness, BP assessed      Balance Overall balance assessment: Needs assistance Sitting-balance support: No upper extremity supported, Feet supported Sitting balance-Leahy Scale: Good Sitting balance - Comments: able to sit EOB for ~15 mins for phone call   Standing balance support: No upper extremity supported, During functional activity Standing balance-Leahy Scale: Fair Standing balance comment: CGA for standing balance due to orthostatic symptoms                           ADL either performed or assessed with clinical judgement   ADL Overall ADL's : Needs assistance/impaired     Grooming: Oral care;Bed level Grooming Details (indicate cue type and reason): pt provided with items for oral care, then answers phone call  Anticipate up to MIN A for LB dressing/transfers with RUE impairments      Vision Baseline Vision/History: 1 Wears glasses              Pertinent Vitals/Pain Pain Assessment Pain Assessment: 0-10 Pain Score: 8  Pain Location: L shoulder Pain Descriptors / Indicators: Guarding, Discomfort Pain Intervention(s): Limited activity within patient's  tolerance, RN gave pain meds during session, Patient requesting pain meds-RN notified     Extremity/Trunk Assessment Upper Extremity Assessment Upper Extremity Assessment: RUE deficits/detail RUE Deficits / Details: pt c/o spasms and increased pain throughout session, unable to raise beyond 90 deg shld flexion w/o increased cramps RUE: Unable to fully assess due to pain   Lower Extremity Assessment Lower Extremity Assessment: Generalized weakness   Cervical / Trunk Assessment Cervical / Trunk Assessment: Normal   Communication Communication Communication: No apparent difficulties   Cognition Arousal: Alert Behavior During Therapy: WFL for tasks assessed/performed                                 Following commands: Intact       Cueing  General Comments   Cueing Techniques: Verbal cues  On 2L O2 upon arrival, SpO2 96-97%. Decreased down to 1L, SpO2 95%. Orthostatic vitals assessed.           Home Living Family/patient expects to be discharged to:: Private residence Living Arrangements: Spouse/significant other (pt states he lives with his girlfriend and her husband) Available Help at Discharge: Other (Comment) (girlfriend) Type of Home: House Home Access: Stairs to enter [employer] of Steps: 4 Entrance Stairs-Rails: None Home Layout: One level     Bathroom Shower/Tub: Chief Strategy Officer: Handicapped height     Home Equipment: None          Prior Functioning/Environment Prior Level of Function : Independent/Modified Independent;Working/employed;Driving                    OT Problem List: Decreased strength;Decreased range of motion;Impaired balance (sitting and/or standing);Decreased activity tolerance;Pain;Impaired UE functional use;Cardiopulmonary status limiting activity;Decreased knowledge of precautions;Decreased knowledge of use of DME or AE   OT Treatment/Interventions: Self-care/ADL  training;Therapeutic exercise;Neuromuscular education;Energy conservation;DME and/or AE instruction;Therapeutic activities;Patient/family education;Balance training      OT Goals(Current goals can be found in the care plan section)   Acute Rehab OT Goals OT Goal Formulation: With patient Time For Goal Achievement: 09/17/23 Potential to Achieve Goals: Good   OT Frequency:  Min 2X/week       AM-PAC OT "6 Clicks" Daily Activity     Outcome Measure Help from another person eating meals?: None Help from another person taking care of personal grooming?: None Help from another person toileting, which includes using toliet, bedpan, or urinal?: A Little Help from another person bathing (including washing, rinsing, drying)?: A Little Help from another person to put on and taking off regular upper body clothing?: A Little Help from another person to put on and taking off regular lower body clothing?: A Little 6 Click Score: 20   End of Session Equipment Utilized During Treatment: Oxygen Nurse Communication: Mobility status;Patient requests pain meds;Other (comment) (orthostatic)  Activity Tolerance: Treatment limited secondary to medical complications (Comment) (orthostatic with standing attempts) Patient left: in bed;with call bell/phone within reach;with bed alarm set  OT Visit Diagnosis: History of falling (Z91.81);Other abnormalities of gait and mobility (R26.89);Unsteadiness on feet (R26.81)  Time: 1610-9604 OT Time Calculation (min): 47 min Charges:  OT General Charges $OT Visit: 1 Visit OT Evaluation $OT Eval Low Complexity: 1 Low OT Treatments $Self Care/Home Management : 23-37 mins  Dajsha Massaro L. Rayona Sardinha, OTR/L  09/03/23, 2:28 PM  09/03/2023, 2:19 PM

## 2023-09-03 NOTE — Progress Notes (Signed)
 Progress Note   Patient: Rodney Howe OZH:[id number] DOB: [birth year] DOA: 09/01/2023     1 DOS: the patient was seen and examined on 09/03/2023    Brief hospital course:   From HPI "[name] is a 73 y.o. male with medical history significant of alcohol abuse, former smoker, HLD, COPD, GERD, depression, BPH, who presents with abdominal pain, black stool, hematemesis, SOB, syncope.  Patient underwent EGD by gastroenterologist that showed findings of gastric ulcer.     Assessment and Plan:  GI bleeding and acute blood loss anemia: CTA of chest/abdomen/pelvis is negative for dissection. CTA showed diverticulosis without evidence of acute diverticulitis. Hgb dropped from 15.1 on 3/27 --> 12.8. pt occasionally taking aspirin 81 mg.   Continue pantoprazole Underwent EGD on 09/02/2023 EGD showed hiatal hernia with nonbleeding gastric ulcer with no stigmata of bleeding Continue to avoid NSAIDs and SQ heparin Case discussed with gastroenterologist Monitor CBC closely   Syncope: Likely secondary to orthostatic hypotension No focal neuro deficits on physical examination.   BP dropping to 76/56, HR 101 with pt symptomatic in standing  Patient also desaturated to 84 on ambulating Continue IV fluid CT head negative. Monitor orthostatic vitals   Leukocytosis: Resolved   COPD (chronic obstructive pulmonary disease) (HCC) -As needed bronchodilators and Mucinex   Alcohol abuse -CIWA protocol Patient was counseled about importance of quitting alcohol use   HLD (hyperlipidemia) Continue Lipitor   BPH (benign prostatic hyperplasia) -Proscar and Flomax   History of pelvic fracture and left hip pain: X-ray of left hip/pelvis redemonstration of an acute to subacute nondisplaced left inferior pubic rami fracture. Question cortical step-off at the left superior pubic rami/pubic symphysis that could represent a minimally displaced fracture. Continue needed morphine, Percocet,  Tylenol for pain   Overweight (BMI 25.0-29.9): Body weight 79.4 kg, BMI 29.12 -Encourage losing weight -Exercise and healthy diet   DVT ppx: SCD   Code Status: Full code    Family Communication:   No family at bedside   Disposition Plan:  Anticipate discharge back to previous environment   Consults: Gastroenterologist   Admission status and Level of care: Telemetry Medical:    for obs      Subjective:  Patient seen and examined at bedside this morning Patient worked with physical therapy today and became severely orthostatic dropping from 110 to the 70s systolic We will attempt IV fluid resuscitation .  Denies nausea vomiting abdominal pain No more bleeding overnight BP dropping to 76/56, HR 101 with pt symptomatic in standing  Patient also desaturated to 84 on ambulating  Physical Exam:   General: Not in acute distress ENT: No discharge from the ears and nose, no pharynx injection Heme: No neck lymph node enlargement. Cardiac: S1/S2, RRR, No murmurs, No gallops or rubs. Respiratory: No rales, wheezing, rhonchi or rubs. GI: Soft, nondistended, nontender GU: No hematuria Ext: No pitting leg edema bilaterally. 1+DP/PT pulse bilaterally. Musculoskeletal: No joint deformities, No joint redness or warmth, no limitation of ROM in spin. Skin: No rashes.  Neuro: Alert, oriented X3, cranial nerves II-XII grossly intact Psych: Patient is not psychotic, no suicidal or hemocidal ideation.   Labs on Admission: I have personally reviewed following labs and imaging studies     Data Reviewed: Above-mentioned imaging reviewed     Latest Ref Rng & Units 09/03/2023    2:03 AM 09/02/2023    3:39 PM 09/02/2023    8:26 AM  CBC  WBC 4.0 - 10.5 K/uL 8.1  7.6  10.0   Hemoglobin 13.0 - 17.0 g/dL 7.8  8.1  9.1   Hematocrit 39.0 - 52.0 % 23.1  23.5  26.9   Platelets 150 - 400 K/uL 263  260  288        Latest Ref Rng & Units 09/03/2023    2:03 AM 09/02/2023    8:26 AM 09/01/2023    6:13 PM   BMP  Glucose 70 - 99 mg/dL 469  86  629   BUN 8 - 23 mg/dL 25  34  49   Creatinine 0.61 - 1.24 mg/dL 5.28  4.13  2.44   Sodium 135 - 145 mmol/L 132  138  134   Potassium 3.5 - 5.1 mmol/L 3.7  4.0  4.8   Chloride 98 - 111 mmol/L 101  108  105   CO2 22 - 32 mmol/L 24  24  22    Calcium 8.9 - 10.3 mg/dL 8.0  8.4  8.6     Vitals:   09/02/23 2220 09/03/23 0403 09/03/23 0725 09/03/23 1052  BP: 100/60 97/70 115/67   Pulse: 88 82 81   Resp:  18 20   Temp:  97.9 F (36.6 C) 98.6 F (37 C)   TempSrc:      SpO2:  93% 96% 96%     Author: Loyce Dys, MD 09/03/2023 12:12 PM  For on call review [email or web address].

## 2023-09-04 ENCOUNTER — Encounter: Payer: Self-pay | Admitting: Urology

## 2023-09-04 ENCOUNTER — Inpatient Hospital Stay

## 2023-09-04 ENCOUNTER — Ambulatory Visit: Payer: 59 | Admitting: Urology

## 2023-09-04 DIAGNOSIS — N138 Other obstructive and reflux uropathy: Secondary | ICD-10-CM

## 2023-09-04 DIAGNOSIS — N529 Male erectile dysfunction, unspecified: Secondary | ICD-10-CM

## 2023-09-04 DIAGNOSIS — K922 Gastrointestinal hemorrhage, unspecified: Secondary | ICD-10-CM | POA: Diagnosis not present

## 2023-09-04 DIAGNOSIS — N486 Induration penis plastica: Secondary | ICD-10-CM

## 2023-09-04 MED ORDER — ACETAMINOPHEN 325 MG PO TABS: 650.0000 mg | ORAL_TABLET | Freq: Four times a day (QID) | ORAL | 0 refills | Status: AC | PRN

## 2023-09-04 MED ORDER — VITAMIN B-1 100 MG PO TABS: 100.0000 mg | ORAL_TABLET | Freq: Every day | ORAL | 3 refills | Status: DC

## 2023-09-04 MED ORDER — FOLIC ACID 1 MG PO TABS: 1.0000 mg | ORAL_TABLET | Freq: Every day | ORAL | 3 refills | Status: DC

## 2023-09-04 MED ORDER — SENNOSIDES-DOCUSATE SODIUM 8.6-50 MG PO TABS: 1.0000 | ORAL_TABLET | Freq: Two times a day (BID) | ORAL | 0 refills | Status: AC

## 2023-09-04 MED ORDER — LACTULOSE 10 GM/15ML PO SOLN
30.0000 g | Freq: Two times a day (BID) | ORAL | Status: DC | PRN
Start: 2023-09-04 — End: 2023-09-04
  Administered 2023-09-04: 30 g via ORAL
  Filled 2023-09-04: qty 60

## 2023-09-04 NOTE — Plan of Care (Signed)
   Problem: Education: Goal: Knowledge of General Education information will improve Description: Including pain rating scale, medication(s)/side effects and non-pharmacologic comfort measures Outcome: Progressing   Problem: Activity: Goal: Risk for activity intolerance will decrease Outcome: Progressing   Problem: Nutrition: Goal: Adequate nutrition will be maintained Outcome: Progressing   Problem: Coping: Goal: Level of anxiety will decrease Outcome: Progressing

## 2023-09-04 NOTE — Progress Notes (Signed)
 Physical Therapy Treatment Patient Details Name: Rodney Howe MRN: [id number] DOB: 1951-02-03 Today's Date: 09/04/2023   History of Present Illness 73 y/o presented to ED on 09/01/23 for SOB and stomach pain. Seen in ED on 3/27 after falling backwards off boat onto concrete. Sustained acute to subacute nondisplaced L inferior pubic rami fracture. Admitted for GI bleed and acute blood loss anemia. PMH: alcohol abuse, former smoker, COPD, depression    PT Comments  Pt was long sitting in bed upon arrival. He is alert and oriented and sitting on rm air. Sao2 97% that only dropped to lowest of 94% during  ambulation and OOB activity. Pt did not want to use AD however due to severity of L hip pain, required +1 HHA to ambulate 50 ft. Author encouraged pt to use RW at DC. He demonstrated much improved gait safety with use of RW when ambulating an additional 50 ft. Overall session limited by pt need to have BM. He is distended but medicated to assist with movement. At conclusion of session, pt was sitting on toilet with RN aware. DC recs remain appropriated to maximize independence and safety with all ADLs.    If plan is discharge home, recommend the following: A little help with walking and/or transfers;A little help with bathing/dressing/bathroom;Assistance with cooking/housework;Assist for transportation;Help with stairs or ramp for entrance     Equipment Recommendations  Rolling walker (2 wheels)       Precautions / Restrictions Precautions Precautions: Fall Recall of Precautions/Restrictions: Intact Precaution/Restrictions Comments: o2 Restrictions Weight Bearing Restrictions Per Provider Order: No     Mobility  Bed Mobility Overal bed mobility: Modified Independent  Transfers Overall transfer level: Needs assistance Equipment used: None, 1 person hand held assist Transfers: Sit to/from Stand Sit to Stand: Contact guard assist   Ambulation/Gait Ambulation/Gait assistance: Contact  guard assist, Supervision Gait Distance (Feet): 50 Feet Assistive device: 1 person hand held assist, Rolling walker (2 wheels) Gait Pattern/deviations: Antalgic, Decreased stance time - left Gait velocity: decreased  General Gait Details: pt ambulated 2 x 50 ft. attempted to ambulate without AD however due to L hip pain required at least +1 HHA. ambulated another 50 ft with RW with much improved stability. Pt's L hip pain makes him ambulate with antalgic gait however no LOB. overall session limited by pt's urge to have BM.   Balance Overall balance assessment: Needs assistance Sitting-balance support: No upper extremity supported, Feet supported Sitting balance-Leahy Scale: Good     Standing balance support: Bilateral upper extremity supported, During functional activity, Reliant on assistive device for balance Standing balance-Leahy Scale: Good Standing balance comment: poor safety/balance without at least +1 UE support. recommend use fo RW at DC until L hip pain improves      Communication Communication Communication: No apparent difficulties  Cognition Arousal: Alert Behavior During Therapy: WFL for tasks assessed/performed   PT - Cognitive impairments: No family/caregiver present to determine baseline  PT - Cognition Comments: overall WFL; slight decrease in safety awareness Following commands: Intact      Cueing Cueing Techniques: Verbal cues     General Comments General comments (skin integrity, edema, etc.): pt was on rm air upon arrival with sao2 97%. sao2 only dropped to 94% during mbulation and OOB activity.      Pertinent Vitals/Pain Pain Assessment Pain Assessment: 0-10 Pain Score: 6  Pain Location: L shoulder Pain Descriptors / Indicators: Guarding, Discomfort, Sharp Pain Intervention(s): Limited activity within patient's tolerance, Monitored during session, Premedicated before session, Repositioned  PT Goals (current goals can now be found in the care plan  section) Acute Rehab PT Goals Patient Stated Goal: to go home Progress towards PT goals: Progressing toward goals    Frequency    Min 2X/week       AM-PAC PT "6 Clicks" Mobility   Outcome Measure  Help needed turning from your back to your side while in a flat bed without using bedrails?: None Help needed moving from lying on your back to sitting on the side of a flat bed without using bedrails?: None Help needed moving to and from a bed to a chair (including a wheelchair)?: A Little Help needed standing up from a chair using your arms (e.g., wheelchair or bedside chair)?: A Little Help needed to walk in hospital room?: A Little Help needed climbing 3-5 steps with a railing? : A Little 6 Click Score: 20    End of Session   Activity Tolerance: Patient tolerated treatment well;Other (comment) (limited by need to have BM) Patient left: Other (comment) (in BR on toilet wiith RN aware) Nurse Communication: Mobility status PT Visit Diagnosis: Muscle weakness (generalized) (M62.81);Unsteadiness on feet (R26.81)     Time: 9562-1308 PT Time Calculation (min) (ACUTE ONLY): 9 min  Charges:    $Gait Training: 8-22 mins PT General Charges $$ ACUTE PT VISIT: 1 Visit                    Jetta Lout PTA 09/04/23, 11:44 AM

## 2023-09-04 NOTE — Progress Notes (Signed)
 PT Cancellation Note  Patient Details Name: Rodney Howe MRN: [id number] DOB: February 13, 1951   Cancelled Treatment:     PT attempt. RN techs assisting pt to BR. Author will return later this date and continue to progress per current POC.    Rushie Chestnut 09/04/2023, 10:03 AM
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
## 2023-10-07 ENCOUNTER — Other Ambulatory Visit: Payer: Self-pay

## 2023-10-07 ENCOUNTER — Ambulatory Visit (INDEPENDENT_AMBULATORY_CARE_PROVIDER_SITE_OTHER): Admitting: Family Medicine

## 2023-10-07 ENCOUNTER — Encounter: Payer: Self-pay | Admitting: Family Medicine

## 2023-10-07 VITALS — BP 126/70 | HR 91 | Resp 16 | Ht 65.0 in | Wt 170.0 lb

## 2023-10-07 DIAGNOSIS — F1010 Alcohol abuse, uncomplicated: Secondary | ICD-10-CM

## 2023-10-07 DIAGNOSIS — N401 Enlarged prostate with lower urinary tract symptoms: Secondary | ICD-10-CM

## 2023-10-07 DIAGNOSIS — E785 Hyperlipidemia, unspecified: Secondary | ICD-10-CM

## 2023-10-07 DIAGNOSIS — Z7689 Persons encountering health services in other specified circumstances: Secondary | ICD-10-CM

## 2023-10-07 DIAGNOSIS — Z122 Encounter for screening for malignant neoplasm of respiratory organs: Secondary | ICD-10-CM

## 2023-10-07 DIAGNOSIS — D62 Acute posthemorrhagic anemia: Secondary | ICD-10-CM

## 2023-10-07 DIAGNOSIS — I4510 Unspecified right bundle-branch block: Secondary | ICD-10-CM

## 2023-10-07 DIAGNOSIS — K2100 Gastro-esophageal reflux disease with esophagitis, without bleeding: Secondary | ICD-10-CM

## 2023-10-07 DIAGNOSIS — Z87891 Personal history of nicotine dependence: Secondary | ICD-10-CM

## 2023-10-07 DIAGNOSIS — N486 Induration penis plastica: Secondary | ICD-10-CM

## 2023-10-07 DIAGNOSIS — K259 Gastric ulcer, unspecified as acute or chronic, without hemorrhage or perforation: Secondary | ICD-10-CM

## 2023-10-07 DIAGNOSIS — Z09 Encounter for follow-up examination after completed treatment for conditions other than malignant neoplasm: Secondary | ICD-10-CM

## 2023-10-07 DIAGNOSIS — J449 Chronic obstructive pulmonary disease, unspecified: Secondary | ICD-10-CM

## 2023-10-07 NOTE — Assessment & Plan Note (Addendum)
 Recheck CBC/iron today he denies any melena or hematemesis He continues to have GERD symptoms He states he stop drinking alcohol and he was instructed to avoid NSAIDs -he was not clear about all medications which he should avoid so we reviewed that at length today Suspect he may need something stronger than omeprazole once daily with recent ulcers hemorrhage and acute blood loss? Refer to outpatient GI

## 2023-10-07 NOTE — Assessment & Plan Note (Signed)
 On multiple meds and no lipids available in EMR We requested records from his past [facility] Unclear why he would need multiple medications, did discontinue pravastatin it does look like he is being prescribed atorvastatin  and I am not sure why he is also on fenofibrate with history of alcoholism possibly has high triglycerides but I am concerned about the stress that may also have on the liver Requesting records, labs today Anticipate simplifying medications with increase atorvastatin  dose or changing to Crestor From what I can see there is no atherosclerotic disease known or noted in the chart

## 2023-10-07 NOTE — Assessment & Plan Note (Signed)
 Pt reported abnormal cardiac monitoring, EKG and blood pressures while in the hospital I reviewed multiple ECGs from his hospitalization and looked back at his last outpatient ECG the right bundle branch block is new with his hospitalization in March Request records from PCP to see if there is any other history in the interim

## 2023-10-07 NOTE — Assessment & Plan Note (Signed)
 Patient is unclear about his daily maintenance inhalers and cannot give me history about his lung health or last exacerbation He does have an albuterol  inhaler, Symbicort  and Spiriva  on the chart and he does not think he has ever had pulmonary function test done Refer to pulmonology for PFTs and further evaluation he also needs to reconnect with lung cancer screening pulmonary team

## 2023-10-07 NOTE — Assessment & Plan Note (Addendum)
 Patient reports a long history of alcohol use and also reports that he stopped drinking but then stated he drinks several shots of liquor after being discharged from the hospital -so current alcohol use and history is unclear Patient educated about the effects of alcohol, advised to avoid alcohol With lab work today do feel like checking deficiencies would be important -folate thiamine , iron deficiency, B12, CBC, liver enzymes

## 2023-10-07 NOTE — Assessment & Plan Note (Signed)
 EGD from hospital in March reviewed, no biopsies taken, PPI medication was continued omeprazole 20 mg once daily -which I am not sure is adequate since he is still symptomatic and notes no improvement since discharge from the hospital, he will need to establish with outpatient GI

## 2023-10-07 NOTE — Patient Instructions (Signed)
 Health Maintenance  Topic Date Due   Medicare Annual Wellness Visit  Never done   Hepatitis C Screening  Never done   COVID-19 Vaccine (4 - 2024-25 season) 02/02/2023   Zoster (Shingles) Vaccine (2 of 2) 01/07/2024*   Flu Shot  01/02/2024   Screening for Lung Cancer  08/31/2024   Colon Cancer Screening  09/20/2027   DTaP/Tdap/Td vaccine (4 - Td or Tdap) 07/13/2030   Pneumonia Vaccine  Completed   HPV Vaccine  Aged Out   Meningitis B Vaccine  Aged Out  *Topic was postponed. The date shown is not the original due date.

## 2023-10-07 NOTE — Assessment & Plan Note (Signed)
 On omeprazole, still symptomatic Stopped ETOH Needs outpt GI f/up

## 2023-10-07 NOTE — Assessment & Plan Note (Signed)
 Per u rology

## 2023-10-07 NOTE — Progress Notes (Signed)
 Name: Rodney Howe   MRN: [id number]    DOB: 01-18-1951   Date:10/07/2023       Progress Note  Chief Complaint  Patient presents with   Establish Care   Subjective:   Rodney Howe is a 73 y.o. male, presents to clinic fto est care, previously est at [facility] - will request records Recent hospitalization for upper GI bleed - no PCP appt since d/c, HFU done today, reviewed discharge summary, labs, dx, test results, EGD etc Hx of GERD and ETOH use/abuse - EGD done by Dr. Ole Berkeley  Acute blood loss anemia - gastric ulcer, pt has passed out 2x since d/c from hospital, EGD done inpt, no GI f/up outpt No PCP f/up or recheck of labs outpt Hemoglobin  Date Value Ref Range Status  09/03/2023 7.8 (L) 13.0 - 17.0 g/dL Final  56/21/3086 8.1 (L) 13.0 - 17.0 g/dL Final  57/84/6962 9.1 (L) 13.0 - 17.0 g/dL Final  95/28/4132 9.0 (L) 13.0 - 17.0 g/dL Final   Sig smoking hx and COPD- on symbicort  and spiriva ? Has rescue inhaler No pulm management   Urology hx - peyronies and BPH with LUTS seen and managed by urology, on meds - per [facility]  IPSS     Row Name 10/07/23 1425         International Prostate Symptom Score   How often have you had the sensation of not emptying your bladder? Not at All     How often have you had to urinate less than every two hours? Not at All     How often have you found you stopped and started again several times when you urinated? Not at All     How often have you found it difficult to postpone urination? Not at All     How often have you had a weak urinary stream? Not at All     How often have you had to strain to start urination? Not at All     How many times did you typically get up at night to urinate? None     Total IPSS Score 0               Also hx of HTN and HLD Not on BP meds right now, BP at goal BP Readings from Last 3 Encounters:  10/07/23 126/70  09/04/23 117/71  08/28/23 (!)  132/93   Multiple cholesterol meds in chart - will get last PCP records and review meds and labs and update but it looks like atorvastatin  and finofibrate (?)  No results found for: "CHOL", "HDL", "LDLCALC", "LDLDIRECT", "TRIG", "CHOLHDL"  Pt with syncope inpt and then passed out at home since he was d/c home Did orthostatics today   Current Outpatient Medications:    acetaminophen  (TYLENOL ) 325 MG tablet, Take 2 tablets (650 mg total) by mouth every 6 (six) hours as needed for mild pain (pain score 1-3) or fever., Disp: 30 tablet, Rfl: 0   albuterol  (VENTOLIN  HFA) 108 (90 Base) MCG/ACT inhaler, Inhale 2 puffs into the lungs every 6 (six) hours as needed for wheezing or shortness of breath., Disp: 1 Inhaler, Rfl: 1   aspirin  EC 81 MG tablet, Take 81 mg by mouth daily., Disp: , Rfl:    atorvastatin  (LIPITOR) 40 MG tablet, Take 40 mg by mouth every evening., Disp: , Rfl:    budesonide -formoterol  (SYMBICORT ) 80-4.5 MCG/ACT inhaler, Inhale 2 puffs into the lungs 2 (two)  times daily., Disp: 1 Inhaler, Rfl: 1   fenofibrate 160 MG tablet, Take 160 mg by mouth daily., Disp: , Rfl:    finasteride  (PROSCAR ) 5 MG tablet, Take 1 tablet (5 mg total) by mouth daily., Disp: 30 tablet, Rfl: 11   folic acid  (FOLVITE ) 1 MG tablet, Take 1 tablet (1 mg total) by mouth daily., Disp: 30 tablet, Rfl: 3   gabapentin  (NEURONTIN ) 300 MG capsule, Take 300 mg by mouth 2 (two) times daily., Disp: , Rfl:    omeprazole (PRILOSEC) 20 MG capsule, Take 20 mg by mouth daily., Disp: , Rfl:    senna-docusate (SENOKOT-S) 8.6-50 MG tablet, Take 1 tablet by mouth 2 (two) times daily., Disp: 30 tablet, Rfl: 0   SPIRIVA  HANDIHALER 18 MCG inhalation capsule, 1 capsule daily., Disp: , Rfl:    tamsulosin  (FLOMAX ) 0.4 MG CAPS capsule, Take 1 capsule (0.4 mg total) by mouth daily., Disp: 30 capsule, Rfl: 11   thiamine  (VITAMIN B-1) 100 MG tablet, Take 1 tablet (100 mg total) by mouth daily., Disp: 30 tablet, Rfl: 3   amitriptyline   (ELAVIL ) 25 MG tablet, Take by mouth. (Patient not taking: Reported on 10/07/2023), Disp: , Rfl:    XIAFLEX  0.9 MG SOLR, , Disp: , Rfl:   Patient Active Problem List   Diagnosis Date Noted   Peyronie's disease 10/07/2023   Former smoker 10/07/2023   RBBB 10/07/2023   Upper GI bleed 09/03/2023   Gastric ulcer without hemorrhage or perforation 09/02/2023   GI bleeding 09/01/2023   Acute blood loss anemia 09/01/2023   History of pelvic fracture 09/01/2023   Overweight (BMI 25.0-29.9) 09/01/2023   Alcohol abuse 09/01/2023   COPD (chronic obstructive pulmonary disease) (HCC)    GERD (gastroesophageal reflux disease)    BPH (benign prostatic hyperplasia)    Hyperlipidemia 06/24/2013   FHx: colon cancer 05/19/2013    Past Surgical History:  Procedure Laterality Date   ANKLE SURGERY     COLONOSCOPY WITH PROPOFOL  N/A 09/19/2020   Procedure: COLONOSCOPY WITH PROPOFOL ;  Surgeon: Luke Salaam, MD;  Location[facility] ENDOSCOPY;  Service: Gastroenterology;  Laterality: N/A;   ESOPHAGOGASTRODUODENOSCOPY N/A 09/02/2023   Procedure: EGD (ESOPHAGOGASTRODUODENOSCOPY);  Surgeon: Marnee Sink, MD;  Location: [facility] ENDOSCOPY;  Service: Endoscopy;  Laterality: N/A;    Family History  Problem Relation Age of Onset   Heart attack Mother    Prostate cancer Father     Social History   Tobacco Use   Smoking status: Former    Current packs/day: 0.00    Average packs/day: 2.0 packs/day for 49.0 years (98.0 ttl pk-yrs)    Types: Cigarettes    Start date: 07/12/1971    Quit date: 07/11/2020    Years since quitting: 3.2   Smokeless tobacco: Never  Substance Use Topics   Alcohol use: Yes    Alcohol/week: 36.0 standard drinks of alcohol    Types: 36 Cans of beer per week   Drug use: Not Currently     No Known Allergies  Health Maintenance  Topic Date Due   Medicare Annual Wellness (AWV)  Never done   Hepatitis C Screening  Never done   COVID-19 Vaccine (4 - 2024-25 season) 02/02/2023   Zoster Vaccines-  Shingrix (2 of 2) 01/07/2024 (Originally 08/05/2023)   INFLUENZA VACCINE  01/02/2024   Lung Cancer Screening  08/31/2024   Colonoscopy  09/20/2027   DTaP/Tdap/Td (4 - Td or Tdap) 07/13/2030   Pneumonia Vaccine 16+ Years old  Completed   HPV VACCINES  Aged Out  Meningococcal B Vaccine  Aged Out    Chart Review Today: I personally reviewed active problem list, medication list, allergies, family history, social history, health maintenance, notes from last encounter, lab results, imaging with the patient/caregiver today.   Review of Systems  Constitutional: Negative.   HENT: Negative.    Eyes: Negative.   Respiratory: Negative.    Cardiovascular: Negative.   Gastrointestinal: Negative.   Endocrine: Negative.   Genitourinary: Negative.   Musculoskeletal: Negative.   Skin: Negative.   Allergic/Immunologic: Negative.   Neurological: Negative.   Hematological: Negative.   Psychiatric/Behavioral: Negative.    All other systems reviewed and are negative.    Objective:   Vitals:   10/07/23 1416  BP: 126/70  Pulse: 91  Resp: 16  SpO2: 100%  Weight: 170 lb (77.1 kg)  Height: 5\' 5"  (1.651 m)    Body mass index is 28.29 kg/m.  Physical Exam Vitals and nursing note reviewed.  Constitutional:      General: He is not in acute distress.    Appearance: Normal appearance. He is well-developed. He is not ill-appearing, toxic-appearing or diaphoretic.  HENT:     Head: Normocephalic and atraumatic.     Nose: Nose normal.  Eyes:     General: No scleral icterus.       Right eye: No discharge.        Left eye: No discharge.     Conjunctiva/sclera: Conjunctivae normal.  Neck:     Trachea: No tracheal deviation.  Cardiovascular:     Rate and Rhythm: Normal rate and regular rhythm.     Pulses: Normal pulses.     Heart sounds: Normal heart sounds.  Pulmonary:     Effort: Pulmonary effort is normal. No respiratory distress.     Breath sounds: No stridor.  Musculoskeletal:      Right lower leg: No edema.     Left lower leg: No edema.  Skin:    General: Skin is warm and dry.     Coloration: Skin is not pale.     Findings: No rash.  Neurological:     Mental Status: He is alert. Mental status is at baseline.     Motor: No abnormal muscle tone.     Coordination: Coordination normal.  Psychiatric:        Behavior: Behavior normal.      Functional Status Survey: Is the patient deaf or have difficulty hearing?: No Does the patient have difficulty seeing, even when wearing glasses/contacts?: No Does the patient have difficulty concentrating, remembering, or making decisions?: No Does the patient have difficulty walking or climbing stairs?: No Does the patient have difficulty dressing or bathing?: No Does the patient have difficulty doing errands alone such as visiting a doctor's office or shopping?: No Results for orders placed or performed during the hospital encounter of 09/01/23  CBC with Differential   Collection Time: 09/01/23  3:51 PM  Result Value Ref Range   WBC 13.4 (H) 4.0 - 10.5 K/uL   RBC 4.18 (L) 4.22 - 5.81 MIL/uL   Hemoglobin 12.8 (L) 13.0 - 17.0 g/dL   HCT 04.5 (L) 40.9 - 81.1 %   MCV 93.1 80.0 - 100.0 fL   MCH 30.6 26.0 - 34.0 pg   MCHC 32.9 30.0 - 36.0 g/dL   RDW 91.4 78.2 - 95.6 %   Platelets 434 (H) 150 - 400 K/uL   nRBC 0.0 0.0 - 0.2 %   Neutrophils Relative % 71 %   Neutro  Abs 9.4 (H) 1.7 - 7.7 K/uL   Lymphocytes Relative 20 %   Lymphs Abs 2.7 0.7 - 4.0 K/uL   Monocytes Relative 8 %   Monocytes Absolute 1.1 (H) 0.1 - 1.0 K/uL   Eosinophils Relative 0 %   Eosinophils Absolute 0.0 0.0 - 0.5 K/uL   Basophils Relative 0 %   Basophils Absolute 0.1 0.0 - 0.1 K/uL   Immature Granulocytes 1 %   Abs Immature Granulocytes 0.12 (H) 0.00 - 0.07 K/uL  Troponin I (High Sensitivity)   Collection Time: 09/01/23  3:51 PM  Result Value Ref Range   Troponin I (High Sensitivity) 11 <18 ng/L  Lactic acid, plasma   Collection Time: 09/01/23   4:18 PM  Result Value Ref Range   Lactic Acid, Venous 1.5 0.5 - 1.9 mmol/L  Comprehensive metabolic panel with GFR   Collection Time: 09/01/23  6:13 PM  Result Value Ref Range   Sodium 134 (L) 135 - 145 mmol/L   Potassium 4.8 3.5 - 5.1 mmol/L   Chloride 105 98 - 111 mmol/L   CO2 22 22 - 32 mmol/L   Glucose, Bld 113 (H) 70 - 99 mg/dL   BUN 49 (H) 8 - 23 mg/dL   Creatinine, Ser 8.11 0.61 - 1.24 mg/dL   Calcium  8.6 (L) 8.9 - 10.3 mg/dL   Total Protein 5.7 (L) 6.5 - 8.1 g/dL   Albumin 2.8 (L) 3.5 - 5.0 g/dL   AST 24 15 - 41 U/L   ALT 25 0 - 44 U/L   Alkaline Phosphatase 45 38 - 126 U/L   Total Bilirubin 0.6 0.0 - 1.2 mg/dL   GFR, Estimated >91 >47 mL/min   Anion gap 7 5 - 15  Lipase, blood   Collection Time: 09/01/23  6:13 PM  Result Value Ref Range   Lipase 25 11 - 51 U/L  Troponin I (High Sensitivity)   Collection Time: 09/01/23  6:13 PM  Result Value Ref Range   Troponin I (High Sensitivity) 13 <18 ng/L  Urinalysis, Routine w reflex microscopic -Urine, Clean Catch   Collection Time: 09/01/23  8:20 PM  Result Value Ref Range   Color, Urine STRAW (A) YELLOW   APPearance CLEAR (A) CLEAR   Specific Gravity, Urine >1.046 (H) 1.005 - 1.030   pH 5.0 5.0 - 8.0   Glucose, UA NEGATIVE NEGATIVE mg/dL   Hgb urine dipstick NEGATIVE NEGATIVE   Bilirubin Urine NEGATIVE NEGATIVE   Ketones, ur NEGATIVE NEGATIVE mg/dL   Protein, ur NEGATIVE NEGATIVE mg/dL   Nitrite NEGATIVE NEGATIVE   Leukocytes,Ua NEGATIVE NEGATIVE  Protime-INR   Collection Time: 09/01/23  8:20 PM  Result Value Ref Range   Prothrombin Time 14.6 11.4 - 15.2 seconds   INR 1.1 0.8 - 1.2  APTT   Collection Time: 09/01/23  8:20 PM  Result Value Ref Range   aPTT 24 24 - 36 seconds  CBC   Collection Time: 09/01/23  8:20 PM  Result Value Ref Range   WBC 10.7 (H) 4.0 - 10.5 K/uL   RBC 3.29 (L) 4.22 - 5.81 MIL/uL   Hemoglobin 10.4 (L) 13.0 - 17.0 g/dL   HCT 82.9 (L) 56.2 - 13.0 %   MCV 94.2 80.0 - 100.0 fL   MCH 31.6  26.0 - 34.0 pg   MCHC 33.5 30.0 - 36.0 g/dL   RDW 86.5 78.4 - 69.6 %   Platelets 331 150 - 400 K/uL   nRBC 0.0 0.0 - 0.2 %  HIV  Antibody (routine testing w rflx)   Collection Time: 09/01/23  8:20 PM  Result Value Ref Range   HIV Screen 4th Generation wRfx Non Reactive Non Reactive  Urine Drug Screen, Qualitative ([facility] only)   Collection Time: 09/01/23  8:20 PM  Result Value Ref Range   Tricyclic, Ur Screen POSITIVE (A) NONE DETECTED   Amphetamines, Ur Screen NONE DETECTED NONE DETECTED   MDMA (Ecstasy)Ur Screen NONE DETECTED NONE DETECTED   Cocaine Metabolite,Ur [place] NONE DETECTED NONE DETECTED   Opiate, Ur Screen NONE DETECTED NONE DETECTED   Phencyclidine (PCP) Ur S NONE DETECTED NONE DETECTED   Cannabinoid 50 Ng, Ur [place] NONE DETECTED NONE DETECTED   Barbiturates, Ur Screen NONE DETECTED NONE DETECTED   Benzodiazepine, Ur Scrn NONE DETECTED NONE DETECTED   Methadone Scn, Ur NONE DETECTED NONE DETECTED  Type and screen [facility]   Collection Time: 09/01/23  8:20 PM  Result Value Ref Range   ABO/RH(D) O POS    Antibody Screen NEG    Sample Expiration      09/04/2023,2359 Performed at [facility], [address] [place], [place] [address]   CBC   Collection Time: 09/02/23  1:42 AM  Result Value Ref Range   WBC 9.5 4.0 - 10.5 K/uL   RBC 2.84 (L) 4.22 - 5.81 MIL/uL   Hemoglobin 9.0 (L) 13.0 - 17.0 g/dL   HCT 21.3 (L) 08.6 - 57.8 %   MCV 91.9 80.0 - 100.0 fL   MCH 31.7 26.0 - 34.0 pg   MCHC 34.5 30.0 - 36.0 g/dL   RDW 46.9 62.9 - 52.8 %   Platelets 279 150 - 400 K/uL   nRBC 0.0 0.0 - 0.2 %  CBC   Collection Time: 09/02/23  8:26 AM  Result Value Ref Range   WBC 10.0 4.0 - 10.5 K/uL   RBC 2.94 (L) 4.22 - 5.81 MIL/uL   Hemoglobin 9.1 (L) 13.0 - 17.0 g/dL   HCT 41.3 (L) 24.4 - 01.0 %   MCV 91.5 80.0 - 100.0 fL   MCH 31.0 26.0 - 34.0 pg   MCHC 33.8 30.0 - 36.0 g/dL   RDW 27.2 53.6 - 64.4 %   Platelets 288 150 - 400 K/uL   nRBC 0.0 0.0 -  0.2 %  Basic metabolic panel   Collection Time: 09/02/23  8:26 AM  Result Value Ref Range   Sodium 138 135 - 145 mmol/L   Potassium 4.0 3.5 - 5.1 mmol/L   Chloride 108 98 - 111 mmol/L   CO2 24 22 - 32 mmol/L   Glucose, Bld 86 70 - 99 mg/dL   BUN 34 (H) 8 - 23 mg/dL   Creatinine, Ser 0.34 0.61 - 1.24 mg/dL   Calcium  8.4 (L) 8.9 - 10.3 mg/dL   GFR, Estimated >74 >25 mL/min   Anion gap 6 5 - 15  CBC   Collection Time: 09/02/23  3:39 PM  Result Value Ref Range   WBC 7.6 4.0 - 10.5 K/uL   RBC 2.58 (L) 4.22 - 5.81 MIL/uL   Hemoglobin 8.1 (L) 13.0 - 17.0 g/dL   HCT 95.6 (L) 38.7 - 56.4 %   MCV 91.1 80.0 - 100.0 fL   MCH 31.4 26.0 - 34.0 pg   MCHC 34.5 30.0 - 36.0 g/dL   RDW 33.2 95.1 - 88.4 %   Platelets 260 150 - 400 K/uL   nRBC 0.0 0.0 - 0.2 %  CBC with Differential/Platelet   Collection  Time: 09/03/23  2:03 AM  Result Value Ref Range   WBC 8.1 4.0 - 10.5 K/uL   RBC 2.49 (L) 4.22 - 5.81 MIL/uL   Hemoglobin 7.8 (L) 13.0 - 17.0 g/dL   HCT 40.9 (L) 81.1 - 91.4 %   MCV 92.8 80.0 - 100.0 fL   MCH 31.3 26.0 - 34.0 pg   MCHC 33.8 30.0 - 36.0 g/dL   RDW 78.2 95.6 - 21.3 %   Platelets 263 150 - 400 K/uL   nRBC 0.0 0.0 - 0.2 %   Neutrophils Relative % 55 %   Neutro Abs 4.6 1.7 - 7.7 K/uL   Lymphocytes Relative 33 %   Lymphs Abs 2.6 0.7 - 4.0 K/uL   Monocytes Relative 8 %   Monocytes Absolute 0.7 0.1 - 1.0 K/uL   Eosinophils Relative 2 %   Eosinophils Absolute 0.1 0.0 - 0.5 K/uL   Basophils Relative 1 %   Basophils Absolute 0.0 0.0 - 0.1 K/uL   Immature Granulocytes 1 %   Abs Immature Granulocytes 0.04 0.00 - 0.07 K/uL  Basic metabolic panel   Collection Time: 09/03/23  2:03 AM  Result Value Ref Range   Sodium 132 (L) 135 - 145 mmol/L   Potassium 3.7 3.5 - 5.1 mmol/L   Chloride 101 98 - 111 mmol/L   CO2 24 22 - 32 mmol/L   Glucose, Bld 102 (H) 70 - 99 mg/dL   BUN 25 (H) 8 - 23 mg/dL   Creatinine, Ser 0.86 0.61 - 1.24 mg/dL   Calcium  8.0 (L) 8.9 - 10.3 mg/dL   GFR,  Estimated >57 >84 mL/min   Anion gap 7 5 - 15      Assessment & Plan:   Encounter to establish care with new doctor Req records from [facility] Patient very difficult historian.  If I asked him a question about only his cholesterol medications he would answer by talking about something completely different.  He answered his cell phone multiple times during the office visit.  Seems to have low health literacy. I explained to him that I will request his records and review them thoroughly to update our medical records accurately   Hyperlipidemia, unspecified hyperlipidemia type Assessment & Plan: On multiple meds and no lipids available in EMR We requested records from [facility] Unclear why he would need multiple medications, did discontinue pravastatin it does look like he is being prescribed atorvastatin  and I am not sure why he is also on fenofibrate with history of alcoholism possibly has high triglycerides but I am concerned about the stress that may also have on the liver Requesting records, labs today Anticipate simplifying medications with increase atorvastatin  dose or changing to Crestor From what I can see there is no atherosclerotic disease known or noted in the chart  Orders: -     Comprehensive metabolic panel with GFR -     Lipid panel  Alcohol abuse Assessment & Plan: Patient reports a long history of alcohol use and also reports that he stopped drinking but then stated he drinks several shots of liquor after being discharged from the hospital -so current alcohol use and history is unclear Patient educated about the effects of alcohol, advised to avoid alcohol With lab work today do feel like checking deficiencies would be important -folate thiamine , iron deficiency, B12, CBC, liver enzymes  Orders: -     CBC with Differential/Platelet -  Comprehensive metabolic panel with GFR -     Z61  and Folate Panel -     Vitamin B1 -     Iron, TIBC and Ferritin Panel  Benign prostatic hyperplasia with lower urinary tract symptoms, symptom details unspecified Assessment & Plan: Managed by urology, on multiple medications and IPSS done today shows virtually no symptoms   Gastroesophageal reflux disease with esophagitis, unspecified whether hemorrhage Assessment & Plan: On omeprazole, still symptomatic Stopped ETOH Needs outpt GI f/up   Chronic obstructive pulmonary disease, unspecified COPD type (HCC) Assessment & Plan: Patient is unclear about his daily maintenance inhalers and cannot give me history about his lung health or last exacerbation He does have an albuterol  inhaler, Symbicort  and Spiriva  on the chart and he does not think he has ever had pulmonary function test done Refer to pulmonology for PFTs and further evaluation he also needs to reconnect with lung cancer screening pulmonary team  Orders: -     CBC with Differential/Platelet  Peyronie's disease Assessment & Plan: Per urology   Former smoker Assessment & Plan: Significant smoking history  Orders: -     Ambulatory Referral for Lung Cancer Scre  Acute blood loss anemia Assessment & Plan: Recheck CBC/iron today he denies any melena or hematemesis He continues to have GERD symptoms He states he stop drinking alcohol and he was instructed to avoid NSAIDs -he was not clear about all medications which he should avoid so we reviewed that at length today Suspect he may need something stronger than omeprazole once daily with recent ulcers hemorrhage and acute blood loss? Refer to outpatient GI  Orders: -     Fecal Globin By Immunochemistry -     Iron, TIBC and Ferritin Panel -     Ambulatory referral to Gastroenterology  Gastric ulcer without hemorrhage or perforation, unspecified chronicity Assessment & Plan: EGD from hospital in March reviewed, no biopsies taken, PPI medication was continued omeprazole  20 mg once daily -which I am not sure is adequate since he is still symptomatic and notes no improvement since discharge from the hospital, he will need to establish with outpatient GI  Orders: -     CBC with Differential/Platelet -     Ambulatory referral to Gastroenterology  RBBB Assessment & Plan: Pt reported abnormal cardiac monitoring, EKG and blood pressures while in the hospital I reviewed multiple ECGs from his hospitalization and looked back at his last outpatient ECG the right bundle branch block is new with his hospitalization in March Request records from PCP to see if there is any other history in the interim   Encounter for screening for malignant neoplasm of lung in former smoker who quit in past 15 years with 30 pack year history or greater -     Ambulatory Referral for Lung Cancer [facility] discharge follow-up - done today, see HPI -     CBC with Differential/Platelet -     Comprehensive metabolic panel with GFR -     W96 and Folate Panel -     Vitamin B1         Return for 1 month follow up for continued f/up, review records, labs, anemia.   Adeline Hone, PA-C 10/07/23 5:22 PM

## 2023-10-07 NOTE — Assessment & Plan Note (Signed)
 Significant smoking history

## 2023-10-07 NOTE — Assessment & Plan Note (Signed)
 Managed by urology, on multiple medications and IPSS done today shows virtually no symptoms

## 2023-10-10 LAB — LIPID PANEL
Cholesterol: 116 mg/dL (ref <200)
HDL: 43 mg/dL (ref >=40)
LDL Cholesterol (Calc): 53 mg/dL
Non-HDL Cholesterol (Calc): 73 mg/dL (ref <130)
Total CHOL/HDL Ratio: 2.7 (calc) (ref <5.0)
Triglycerides: 123 mg/dL (ref <150)

## 2023-10-10 LAB — CBC WITH DIFFERENTIAL/PLATELET
Absolute Lymphocytes: 2251 {cells}/uL (ref 850–3900)
Absolute Monocytes: 643 {cells}/uL (ref 200–950)
Basophils Absolute: 40 {cells}/uL (ref 0–200)
Basophils Relative: 0.6 %
Eosinophils Absolute: 141 {cells}/uL (ref 15–500)
Eosinophils Relative: 2.1 %
HCT: 23 % — ABNORMAL LOW (ref 38.5–50.0)
Hemoglobin: 7 g/dL — ABNORMAL LOW (ref 13.2–17.1)
MCH: 25.6 pg — ABNORMAL LOW (ref 27.0–33.0)
MCHC: 30.4 g/dL — ABNORMAL LOW (ref 32.0–36.0)
MCV: 84.2 fL (ref 80.0–100.0)
MPV: 10 fL (ref 7.5–12.5)
Monocytes Relative: 9.6 %
Neutro Abs: 3625 {cells}/uL (ref 1500–7800)
Neutrophils Relative %: 54.1 %
Platelets: 557 10*3/uL — ABNORMAL HIGH (ref 140–400)
RBC: 2.73 10*6/uL — ABNORMAL LOW (ref 4.20–5.80)
RDW: 16.7 % — ABNORMAL HIGH (ref 11.0–15.0)
Total Lymphocyte: 33.6 %
WBC: 6.7 10*3/uL (ref 3.8–10.8)

## 2023-10-10 LAB — VITAMIN B1: Vitamin B1 (Thiamine): 57 nmol/L — ABNORMAL HIGH (ref 8–30)

## 2023-10-10 LAB — IRON,TIBC AND FERRITIN PANEL
%SAT: 2 % — ABNORMAL LOW (ref 20–48)
Ferritin: 12 ng/mL — ABNORMAL LOW (ref 24–380)
Iron: 10 ug/dL — ABNORMAL LOW (ref 50–180)
TIBC: 440 ug/dL — ABNORMAL HIGH (ref 250–425)

## 2023-10-10 LAB — COMPREHENSIVE METABOLIC PANEL WITH GFR
AG Ratio: 1.3 (calc) (ref 1.0–2.5)
ALT: 16 U/L (ref 9–46)
AST: 16 U/L (ref 10–35)
Albumin: 3.5 g/dL — ABNORMAL LOW (ref 3.6–5.1)
Alkaline phosphatase (APISO): 67 U/L (ref 35–144)
BUN: 8 mg/dL (ref 7–25)
CO2: 27 mmol/L (ref 20–32)
Calcium: 8.5 mg/dL — ABNORMAL LOW (ref 8.6–10.3)
Chloride: 104 mmol/L (ref 98–110)
Creat: 1.1 mg/dL (ref 0.70–1.28)
Globulin: 2.6 g/dL (ref 1.9–3.7)
Glucose, Bld: 97 mg/dL (ref 65–99)
Potassium: 4.4 mmol/L (ref 3.5–5.3)
Sodium: 138 mmol/L (ref 135–146)
Total Bilirubin: 0.2 mg/dL (ref 0.2–1.2)
Total Protein: 6.1 g/dL (ref 6.1–8.1)
eGFR: 72 mL/min/{1.73_m2} (ref >=60)

## 2023-10-10 LAB — B12 AND FOLATE PANEL
Folate: 19.1 ng/mL
Vitamin B-12: 308 pg/mL (ref 200–1100)

## 2023-10-10 LAB — CBC MORPHOLOGY

## 2023-10-13 ENCOUNTER — Other Ambulatory Visit: Payer: Self-pay

## 2023-10-13 ENCOUNTER — Other Ambulatory Visit: Payer: Self-pay | Admitting: Family Medicine

## 2023-10-13 ENCOUNTER — Emergency Department: Admission: EM | Admit: 2023-10-13 | Discharge: 2023-10-13 | Disposition: A | Discharge status: Home / Self Care

## 2023-10-13 DIAGNOSIS — D62 Acute posthemorrhagic anemia: Secondary | ICD-10-CM

## 2023-10-13 DIAGNOSIS — J449 Chronic obstructive pulmonary disease, unspecified: Secondary | ICD-10-CM | POA: Insufficient documentation

## 2023-10-13 DIAGNOSIS — D649 Anemia, unspecified: Secondary | ICD-10-CM | POA: Diagnosis present

## 2023-10-13 DIAGNOSIS — E519 Thiamine deficiency, unspecified: Secondary | ICD-10-CM

## 2023-10-13 DIAGNOSIS — F1010 Alcohol abuse, uncomplicated: Secondary | ICD-10-CM

## 2023-10-13 DIAGNOSIS — K259 Gastric ulcer, unspecified as acute or chronic, without hemorrhage or perforation: Secondary | ICD-10-CM

## 2023-10-13 DIAGNOSIS — E538 Deficiency of other specified B group vitamins: Secondary | ICD-10-CM

## 2023-10-13 DIAGNOSIS — E785 Hyperlipidemia, unspecified: Secondary | ICD-10-CM

## 2023-10-13 LAB — CBC WITH DIFFERENTIAL/PLATELET
Abs Immature Granulocytes: 0.02 10*3/uL (ref 0.00–0.07)
Basophils Absolute: 0.1 10*3/uL (ref 0.0–0.1)
Basophils Relative: 1 %
Eosinophils Absolute: 0.1 10*3/uL (ref 0.0–0.5)
Eosinophils Relative: 2 %
HCT: 26.2 % — ABNORMAL LOW (ref 39.0–52.0)
Hemoglobin: 7.9 g/dL — ABNORMAL LOW (ref 13.0–17.0)
Immature Granulocytes: 0 %
Lymphocytes Relative: 34 %
Lymphs Abs: 2.2 10*3/uL (ref 0.7–4.0)
MCH: 25.1 pg — ABNORMAL LOW (ref 26.0–34.0)
MCHC: 30.2 g/dL (ref 30.0–36.0)
MCV: 83.2 fL (ref 80.0–100.0)
Monocytes Absolute: 0.8 10*3/uL (ref 0.1–1.0)
Monocytes Relative: 13 %
Neutro Abs: 3.3 10*3/uL (ref 1.7–7.7)
Neutrophils Relative %: 50 %
Platelets: 739 10*3/uL — ABNORMAL HIGH (ref 150–400)
RBC: 3.15 MIL/uL — ABNORMAL LOW (ref 4.22–5.81)
RDW: 17 % — ABNORMAL HIGH (ref 11.5–15.5)
WBC: 6.5 10*3/uL (ref 4.0–10.5)
nRBC: 0 % (ref 0.0–0.2)

## 2023-10-13 LAB — TYPE AND SCREEN
ABO/RH(D): O POS
Antibody Screen: NEGATIVE

## 2023-10-13 LAB — COMPREHENSIVE METABOLIC PANEL WITH GFR
ALT: 17 U/L (ref 0–44)
AST: 18 U/L (ref 15–41)
Albumin: 3.4 g/dL — ABNORMAL LOW (ref 3.5–5.0)
Alkaline Phosphatase: 61 U/L (ref 38–126)
Anion gap: 5 (ref 5–15)
BUN: 13 mg/dL (ref 8–23)
CO2: 26 mmol/L (ref 22–32)
Calcium: 8.8 mg/dL — ABNORMAL LOW (ref 8.9–10.3)
Chloride: 102 mmol/L (ref 98–111)
Creatinine, Ser: 1.09 mg/dL (ref 0.61–1.24)
GFR, Estimated: >60 mL/min (ref >60)
Glucose, Bld: 103 mg/dL — ABNORMAL HIGH (ref 70–99)
Potassium: 4.3 mmol/L (ref 3.5–5.1)
Sodium: 133 mmol/L — ABNORMAL LOW (ref 135–145)
Total Bilirubin: 0.5 mg/dL (ref 0.0–1.2)
Total Protein: 6.8 g/dL (ref 6.5–8.1)

## 2023-10-13 MED ORDER — ATORVASTATIN CALCIUM 40 MG PO TABS: 40.0000 mg | ORAL_TABLET | Freq: Every evening | ORAL | 3 refills | Status: DC

## 2023-10-13 MED ORDER — VITAMIN B-1 100 MG PO TABS: 100.0000 mg | ORAL_TABLET | Freq: Every day | ORAL | 3 refills | Status: DC

## 2023-10-13 MED ORDER — VITAMIN B-12 1000 MCG PO TABS: 1000.0000 ug | ORAL_TABLET | Freq: Every day | ORAL | 1 refills | Status: DC

## 2023-10-13 MED ORDER — PANTOPRAZOLE SODIUM 40 MG PO TBEC: 40.0000 mg | DELAYED_RELEASE_TABLET | Freq: Two times a day (BID) | ORAL | 3 refills | Status: DC

## 2023-10-13 MED ORDER — FOLIC ACID 1 MG PO TABS
1.0000 mg | ORAL_TABLET | Freq: Every day | ORAL | 3 refills | Status: DC
Start: 2023-10-13 — End: 2024-02-10

## 2023-10-13 NOTE — ED Triage Notes (Addendum)
 Pt here from [facility] PCP due to low HgB after routine check up, pt endorses that is the first time he has been to a MD in "awhile".   Pt denies taking blood thinners. Pt does endorse having intermittent black stools. Pt states he fell last March and states this is when "everything started to happen".   Pt does endorse FX'ing Pelvis in March. NAD noted. Pt states pain only from previous pelvic FX.

## 2023-10-13 NOTE — Discharge Instructions (Addendum)
 Your evaluation in the emergency department was reassuring, and your blood level improved from prior.  You have been started on pantoprazole  40 mg twice daily--please try taking this as recently prescribed by your outpatient provider.  Follow-up with your GI provider tomorrow as already planned.  Return to the emergency department with any new or worsening symptoms.

## 2023-10-13 NOTE — ED Provider Notes (Signed)
 [facility] Provider Note    None    (approximate)   History   No chief complaint on file.  Pt here from [facility] due to low HgB after routine check up, pt endorses that is the first time he has been to a MD in "awhile".   Pt denies taking blood thinners. Pt does endorse having intermittent black stools. Pt states he fell last March and states this is when "everything started to happen".   Pt does endorse FX'ing Pelvis in March. NAD noted. Pt states pain only from previous pelvic FX.    HPI Deloss Rathel is a 73 y.o. male PMH COPD, PUD, prior upper GI bleed/gastric ulcer (last admitted March 2025 per my chart review), hyperlipidemia presents for evaluation of reportedly low hemoglobin - Patient was seen in clinic last week, noted to have a anemia to 7.0.  Baseline hemoglobin running around 8 recently since his hospitalization.  Did have a dark stool early last week though has not had any since then.  No abdominal pain.  Has been feeling somewhat fatigued with dyspnea on exertion last week though feels very well today and is asymptomatic. - No NSAIDs, no alcohol, not on thinners - Has an outpatient GI appointment tomorrow - He is taking his antiacids as prescribed       Physical Exam   Triage Vital Signs: ED Triage Vitals  Encounter Vitals Group     BP 10/13/23 1242 (!) 140/73     Systolic BP Percentile --      Diastolic BP Percentile --      Pulse Rate 10/13/23 1242 86     Resp 10/13/23 1242 17     Temp 10/13/23 1242 97.7 F (36.5 C)     Temp Source 10/13/23 1242 Oral     SpO2 10/13/23 1242 92 %     Weight 10/13/23 1243 169 lb 15.6 oz (77.1 kg)     Height 10/13/23 1243 5\' 5"  (1.651 m)     Head Circumference --      Peak Flow --      Pain Score 10/13/23 1243 5     Pain Loc --      Pain Education --      Exclude from Growth Chart --     Most recent vital signs: Vitals:   10/13/23 1242  BP: (!) 140/73  Pulse: 86  Resp: 17   Temp: 97.7 F (36.5 C)  SpO2: 92%     General: Awake, no distress.  CV:  Good peripheral perfusion. RRR, RP 2+ Resp:  Normal effort. CTAB Abd:  No distention. Nontender to deep palpation throughout Rectal:  light brown stool, no blood/melena, Hemoccult negative   ED Results / Procedures / Treatments   Labs (all labs ordered are listed, but only abnormal results are displayed) Labs Reviewed  CBC WITH DIFFERENTIAL/PLATELET - Abnormal; Notable for the following components:      Result Value   RBC 3.15 (*)    Hemoglobin 7.9 (*)    HCT 26.2 (*)    MCH 25.1 (*)    RDW 17.0 (*)    Platelets 739 (*)    All other components within normal limits  COMPREHENSIVE METABOLIC PANEL WITH GFR - Abnormal; Notable for the following components:   Sodium 133 (*)    Glucose, Bld 103 (*)    Calcium  8.8 (*)    Albumin 3.4 (*)    All other components within normal limits  TYPE  AND SCREEN     EKG  N/a   RADIOLOGY  N/a   PROCEDURES:  Critical Care performed: No  Procedures   MEDICATIONS ORDERED IN ED: Medications - No data to display   IMPRESSION / MDM / ASSESSMENT AND PLAN / ED COURSE  I reviewed the triage vital signs and the nursing notes.                              DDX/MDM/AP: Differential diagnosis includes, but is not limited to, ongoing anemia in the setting of recent GI bleed, consider recurrent GI bleed.  Plan: - Labs  -reassess  Patient's presentation is most consistent with acute presentation with potential threat to life or bodily function.    ED course below.  Labs with improved anemia, back to 7.9 (baseline 8 prior to discharge).  Rectal exam negative.  Patient asymptomatic, no indication for transfusion at this time.  Hemodynamically stable.  Has outpatient GI follow-up tomorrow.  Outpatient provider earlier today switch from omeprazole 20 mg daily to pantoprazole  40 mg twice daily --I agree with this plan.  Patient stable for outpatient follow-up  tomorrow.  ED return precautions in place.  Patient agrees with plan.      FINAL CLINICAL IMPRESSION(S) / ED DIAGNOSES   Final diagnoses:  Anemia, unspecified type     Rx / DC Orders   ED Discharge Orders     None        Note:  This document was prepared using Dragon voice recognition software and may include unintentional dictation errors.   Collis Deaner, MD 10/13/23 [phone]

## 2023-10-15 ENCOUNTER — Encounter: Payer: Self-pay | Admitting: Oncology

## 2023-10-15 ENCOUNTER — Inpatient Hospital Stay

## 2023-10-15 ENCOUNTER — Inpatient Hospital Stay: Attending: Oncology | Admitting: Oncology

## 2023-10-15 VITALS — BP 107/67 | HR 94 | Temp 97.3°F | Resp 16 | Ht 65.0 in | Wt 167.9 lb

## 2023-10-15 DIAGNOSIS — K922 Gastrointestinal hemorrhage, unspecified: Secondary | ICD-10-CM | POA: Diagnosis present

## 2023-10-15 DIAGNOSIS — D509 Iron deficiency anemia, unspecified: Secondary | ICD-10-CM | POA: Insufficient documentation

## 2023-10-15 DIAGNOSIS — D500 Iron deficiency anemia secondary to blood loss (chronic): Secondary | ICD-10-CM | POA: Insufficient documentation

## 2023-10-15 NOTE — Progress Notes (Signed)
 [facility]  [phone] [phone]  ID: Rodney Howe OB: [birth year]  MR#: [id number]  NGE#:[id number]  Patient Care Team: Adeline Hone, PA-C as PCP - General (Family Medicine) Shellie Dials, MD as Consulting Physician (Hematology and Oncology)  CHIEF COMPLAINT: Iron deficiency anemia.  INTERVAL HISTORY: Patient is a 73 year old male who recently presented to the emergency room with increasing weakness and fatigue and intermittent history of dark tarry stools.  He was found to have significantly decreased hemoglobin and iron stores and was referred for further evaluation.  He had an appointment with GI yesterday who plan to do an EGD in July.  He currently feels well.  He does not complain of any weakness or fatigue today.  He has no neurologic complaints.  He denies any recent fevers or illnesses.  He has a good appetite and denies weight loss.  He has no chest pain, shortness of breath, cough, or hemoptysis.  He denies any nausea, vomiting, constipation, or diarrhea.  He has no urinary complaints.  Patient offers no further specific complaints today.  REVIEW OF SYSTEMS:   Review of Systems  Constitutional: Negative.  Negative for fever, malaise/fatigue and weight loss.  Respiratory: Negative.  Negative for cough, hemoptysis and shortness of breath.   Cardiovascular: Negative.  Negative for chest pain and leg swelling.  Gastrointestinal:  Positive for melena. Negative for abdominal pain and blood in stool.  Genitourinary:  Negative for dysuria.  Musculoskeletal: Negative.  Negative for back pain.  Skin: Negative.  Negative for rash.  Neurological: Negative.  Negative for dizziness, focal weakness, weakness and headaches.  Psychiatric/Behavioral: Negative.  The patient is not nervous/anxious.     As per HPI. Otherwise, a complete review of systems is negative.  PAST MEDICAL HISTORY: Past Medical History:  Diagnosis Date   BPH  (benign prostatic hyperplasia)    COPD (chronic obstructive pulmonary disease) (HCC)    COPD (chronic obstructive pulmonary disease) (HCC)    GERD (gastroesophageal reflux disease)    HLD (hyperlipidemia)     PAST SURGICAL HISTORY: Past Surgical History:  Procedure Laterality Date   ANKLE SURGERY     COLONOSCOPY WITH PROPOFOL  N/A 09/19/2020   Procedure: COLONOSCOPY WITH PROPOFOL ;  Surgeon: Luke Salaam, MD;  Location: [facility] ENDOSCOPY;  Service: Gastroenterology;  Laterality: N/A;   ESOPHAGOGASTRODUODENOSCOPY N/A 09/02/2023   Procedure: EGD (ESOPHAGOGASTRODUODENOSCOPY);  Surgeon: Marnee Sink, MD;  Location: [facility] ENDOSCOPY;  Service: Endoscopy;  Laterality: N/A;    FAMILY HISTORY: Family History  Problem Relation Age of Onset   Heart attack Mother    Prostate cancer Father     ADVANCED DIRECTIVES (Y/N):  N  HEALTH MAINTENANCE: Social History   Tobacco Use   Smoking status: Former    Current packs/day: 0.00    Average packs/day: 2.0 packs/day for 49.0 years (98.0 ttl pk-yrs)    Types: Cigarettes    Start date: 07/12/1971    Quit date: 07/11/2020    Years since quitting: 3.2   Smokeless tobacco: Never  Substance Use Topics   Alcohol use: Yes    Alcohol/week: 36.0 standard drinks of alcohol    Types: 36 Cans of beer per week   Drug use: Not Currently     Colonoscopy:  PAP:  Bone density:  Lipid panel:  No Known Allergies  Current Outpatient Medications  Medication Sig Dispense Refill   acetaminophen  (TYLENOL ) 325 MG tablet Take 2 tablets (650 mg total) by mouth every 6 (six) hours as needed for mild pain (  pain score 1-3) or fever. 30 tablet 0   albuterol  (VENTOLIN  HFA) 108 (90 Base) MCG/ACT inhaler Inhale 2 puffs into the lungs every 6 (six) hours as needed for wheezing or shortness of breath. 1 Inhaler 1   amitriptyline  (ELAVIL ) 25 MG tablet Take 25 mg by mouth at bedtime.     atorvastatin  (LIPITOR) 40 MG tablet Take 1 tablet (40 mg total) by mouth every evening. 90  tablet 3   budesonide -formoterol  (SYMBICORT ) 80-4.5 MCG/ACT inhaler Inhale 2 puffs into the lungs 2 (two) times daily. 1 Inhaler 1   cyanocobalamin (VITAMIN B12) 1000 MCG tablet Take 1 tablet (1,000 mcg total) by mouth daily. 90 tablet 1   fenofibrate 160 MG tablet Take 160 mg by mouth daily.     finasteride  (PROSCAR ) 5 MG tablet Take 1 tablet (5 mg total) by mouth daily. 30 tablet 11   folic acid  (FOLVITE ) 1 MG tablet Take 1 tablet (1 mg total) by mouth daily. 30 tablet 3   gabapentin  (NEURONTIN ) 300 MG capsule Take 300 mg by mouth 2 (two) times daily.     pantoprazole  (PROTONIX ) 40 MG tablet Take 1 tablet (40 mg total) by mouth 2 (two) times daily. 60 tablet 3   senna-docusate (SENOKOT-S) 8.6-50 MG tablet Take 1 tablet by mouth 2 (two) times daily. 30 tablet 0   SPIRIVA  HANDIHALER 18 MCG inhalation capsule 1 capsule daily.     tamsulosin  (FLOMAX ) 0.4 MG CAPS capsule Take 1 capsule (0.4 mg total) by mouth daily. 30 capsule 11   thiamine  (VITAMIN B-1) 100 MG tablet Take 1 tablet (100 mg total) by mouth daily. 30 tablet 3   No current facility-administered medications for this visit.    OBJECTIVE: Vitals:   10/15/23 1411  BP: 107/67  Pulse: 94  Resp: 16  Temp: (!) 97.3 F (36.3 C)  SpO2: 94%     Body mass index is 27.94 kg/m.    ECOG FS:0 - Asymptomatic  General: Well-developed, well-nourished, no acute distress. Eyes: Pink conjunctiva, anicteric sclera. HEENT: Normocephalic, moist mucous membranes. Lungs: No audible wheezing or coughing. Heart: Regular rate and rhythm. Abdomen: Soft, nontender, no obvious distention. Musculoskeletal: No edema, cyanosis, or clubbing. Neuro: Alert, answering all questions appropriately. Cranial nerves grossly intact. Skin: No rashes or petechiae noted. Psych: Normal affect. Lymphatics: No cervical, calvicular, axillary or inguinal LAD.   LAB RESULTS:  Lab Results  Component Value Date   NA 133 (L) 10/13/2023   K 4.3 10/13/2023   CL 102  10/13/2023   CO2 26 10/13/2023   GLUCOSE 103 (H) 10/13/2023   BUN 13 10/13/2023   CREATININE 1.09 10/13/2023   CALCIUM  8.8 (L) 10/13/2023   PROT 6.8 10/13/2023   ALBUMIN 3.4 (L) 10/13/2023   AST 18 10/13/2023   ALT 17 10/13/2023   ALKPHOS 61 10/13/2023   BILITOT 0.5 10/13/2023   GFRNONAA >60 10/13/2023   GFRAA >60 09/18/2019    Lab Results  Component Value Date   WBC 6.5 10/13/2023   NEUTROABS 3.3 10/13/2023   HGB 7.9 (L) 10/13/2023   HCT 26.2 (L) 10/13/2023   MCV 83.2 10/13/2023   PLT 739 (H) 10/13/2023   Lab Results  Component Value Date   IRON 10 (L) 10/07/2023   TIBC 440 (H) 10/07/2023   IRONPCTSAT 2 (L) 10/07/2023   Lab Results  Component Value Date   FERRITIN 12 (L) 10/07/2023     STUDIES: No results found.  ASSESSMENT: Iron deficiency anemia.  PLAN:    Iron deficiency  anemia: Possibly related to GI blood loss.  Patient had consultation with GI yesterday who plans to do an EGD at the end of July 2025.  Patient's hemoglobin iron stores are significantly reduced and he would benefit from IV Venofer.  Return to clinic 5 times over the next 1 to 2 weeks to receive 200 mg IV Venofer.  Patient will then return to clinic in 3 months with repeat laboratory work, further evaluation, and continuation of treatment if needed. GI bleed: Follow-up with GI as scheduled.  I spent a total of 45 minutes reviewing chart data, face-to-face evaluation with the patient, counseling and coordination of care as detailed above.   Patient expressed understanding and was in agreement with this plan. He also understands that He can call clinic at any time with any questions, concerns, or complaints.    Shellie Dials, MD   10/15/2023 3:57 PM

## 2023-10-17 ENCOUNTER — Inpatient Hospital Stay

## 2023-10-17 VITALS — BP 110/70 | HR 100 | Temp 98.4°F | Resp 16

## 2023-10-17 DIAGNOSIS — D500 Iron deficiency anemia secondary to blood loss (chronic): Secondary | ICD-10-CM

## 2023-10-17 DIAGNOSIS — D509 Iron deficiency anemia, unspecified: Secondary | ICD-10-CM | POA: Diagnosis not present

## 2023-10-17 MED ORDER — SODIUM CHLORIDE 0.9% FLUSH
10.0000 mL | Freq: Once | INTRAVENOUS | Status: AC | PRN
  Administered 2023-10-17: 10 mL
  Filled 2023-10-17: qty 10

## 2023-10-17 MED ORDER — IRON SUCROSE 20 MG/ML IV SOLN
200.0000 mg | Freq: Once | INTRAVENOUS | Status: AC
  Administered 2023-10-17: 200 mg via INTRAVENOUS

## 2023-10-17 NOTE — Progress Notes (Signed)
 Patient tolerated for initial Venofer infusion well, no questions/concerns voiced. Monitored 30 min post transfusion. Patient stable at discharge. VSS. AVS given.

## 2023-10-17 NOTE — Patient Instructions (Signed)

## 2023-10-21 ENCOUNTER — Inpatient Hospital Stay

## 2023-10-21 VITALS — BP 111/66 | HR 74 | Temp 98.4°F | Resp 17

## 2023-10-21 DIAGNOSIS — D509 Iron deficiency anemia, unspecified: Secondary | ICD-10-CM | POA: Diagnosis not present

## 2023-10-21 DIAGNOSIS — D500 Iron deficiency anemia secondary to blood loss (chronic): Secondary | ICD-10-CM

## 2023-10-21 MED ORDER — IRON SUCROSE 20 MG/ML IV SOLN
200.0000 mg | Freq: Once | INTRAVENOUS | Status: AC
Start: 2023-10-21 — End: 2023-10-21
  Administered 2023-10-21: 200 mg via INTRAVENOUS

## 2023-10-21 NOTE — Patient Instructions (Signed)

## 2023-10-24 ENCOUNTER — Inpatient Hospital Stay

## 2023-10-24 VITALS — BP 114/71 | HR 89 | Temp 98.6°F | Resp 18

## 2023-10-24 DIAGNOSIS — D500 Iron deficiency anemia secondary to blood loss (chronic): Secondary | ICD-10-CM

## 2023-10-24 DIAGNOSIS — D509 Iron deficiency anemia, unspecified: Secondary | ICD-10-CM | POA: Diagnosis not present

## 2023-10-24 MED ORDER — IRON SUCROSE 20 MG/ML IV SOLN
200.0000 mg | Freq: Once | INTRAVENOUS | Status: AC
  Administered 2023-10-24: 200 mg via INTRAVENOUS

## 2023-10-28 ENCOUNTER — Inpatient Hospital Stay

## 2023-10-28 VITALS — BP 124/76 | HR 89 | Temp 96.8°F | Resp 18

## 2023-10-28 DIAGNOSIS — D500 Iron deficiency anemia secondary to blood loss (chronic): Secondary | ICD-10-CM

## 2023-10-28 DIAGNOSIS — D509 Iron deficiency anemia, unspecified: Secondary | ICD-10-CM | POA: Diagnosis not present

## 2023-10-28 MED ORDER — IRON SUCROSE 20 MG/ML IV SOLN
200.0000 mg | Freq: Once | INTRAVENOUS | Status: AC
  Administered 2023-10-28: 200 mg via INTRAVENOUS

## 2023-10-28 NOTE — Patient Instructions (Signed)

## 2023-10-31 ENCOUNTER — Inpatient Hospital Stay

## 2023-10-31 VITALS — BP 128/79 | HR 99 | Temp 98.8°F | Resp 18

## 2023-10-31 DIAGNOSIS — D500 Iron deficiency anemia secondary to blood loss (chronic): Secondary | ICD-10-CM

## 2023-10-31 DIAGNOSIS — D509 Iron deficiency anemia, unspecified: Secondary | ICD-10-CM | POA: Diagnosis not present

## 2023-10-31 MED ORDER — IRON SUCROSE 20 MG/ML IV SOLN
200.0000 mg | Freq: Once | INTRAVENOUS | Status: AC
  Administered 2023-10-31: 200 mg via INTRAVENOUS

## 2023-11-10 ENCOUNTER — Ambulatory Visit: Admitting: Family Medicine

## 2023-11-10 ENCOUNTER — Encounter: Payer: Self-pay | Admitting: Family Medicine

## 2023-11-10 VITALS — BP 116/62 | HR 73 | Temp 97.5°F | Ht 65.0 in | Wt 161.8 lb

## 2023-11-10 DIAGNOSIS — G4700 Insomnia, unspecified: Secondary | ICD-10-CM | POA: Insufficient documentation

## 2023-11-10 DIAGNOSIS — J449 Chronic obstructive pulmonary disease, unspecified: Secondary | ICD-10-CM

## 2023-11-10 DIAGNOSIS — F322 Major depressive disorder, single episode, severe without psychotic features: Secondary | ICD-10-CM

## 2023-11-10 DIAGNOSIS — K2100 Gastro-esophageal reflux disease with esophagitis, without bleeding: Secondary | ICD-10-CM | POA: Diagnosis not present

## 2023-11-10 DIAGNOSIS — E785 Hyperlipidemia, unspecified: Secondary | ICD-10-CM

## 2023-11-10 DIAGNOSIS — D62 Acute posthemorrhagic anemia: Secondary | ICD-10-CM

## 2023-11-10 DIAGNOSIS — Z5181 Encounter for therapeutic drug level monitoring: Secondary | ICD-10-CM

## 2023-11-10 DIAGNOSIS — M25511 Pain in right shoulder: Secondary | ICD-10-CM

## 2023-11-10 DIAGNOSIS — G629 Polyneuropathy, unspecified: Secondary | ICD-10-CM

## 2023-11-10 MED ORDER — PREGABALIN 25 MG PO CAPS: 25.0000 mg | ORAL_CAPSULE | Freq: Two times a day (BID) | ORAL | 0 refills | Status: DC

## 2023-11-10 MED ORDER — BUDESONIDE-FORMOTEROL FUMARATE 80-4.5 MCG/ACT IN AERO: 2.0000 | INHALATION_SPRAY | Freq: Two times a day (BID) | RESPIRATORY_TRACT | 1 refills | Status: DC

## 2023-11-10 MED ORDER — SPIRIVA HANDIHALER 18 MCG IN CAPS: 1.0000 | ORAL_CAPSULE | Freq: Every day | RESPIRATORY_TRACT | 2 refills | Status: DC

## 2023-11-10 NOTE — Assessment & Plan Note (Signed)
 Lipids ordered today - same as last OV, no PCP records received yet   On multiple meds and no lipids available in EMR We requested records from his [facility] with [facility] Unclear why he would need multiple medications, did discontinue pravastatin it does look like he is being prescribed atorvastatin  and I am not sure why he is also on fenofibrate with history of alcoholism possibly has high triglycerides but I am concerned about the stress that may also have on the liver Requesting records, labs today Anticipate simplifying medications with increase atorvastatin  dose or changing to Crestor From what I can see there is no atherosclerotic disease known or noted in the chart

## 2023-11-10 NOTE — Progress Notes (Signed)
 Name: Rodney Howe   MRN: [id number]    DOB: [birth year]   Date:11/10/2023       Progress Note  Chief Complaint  Patient presents with   Medical Management of Chronic Issues    1 month follow up   Insomnia    States he still feels like he is waking up during the night and can't get back to sleep    Shoulder Pain    R shoulder, onset 2.5 months    Depression   Anxiety    Subjective:   Rodney Howe is a 73 y.o. male, presents to clinic to continue to est care and f/up on abnormal labs  He est care about a month ago and had also recent been hospitalized His labs were sig for anemia - he was symptomatic - having endorsed several syncopal and near syncopal episodes in the months prior He was instructed to go to the ED for hemoglobin of 7.0 (down from hospital encounter where it was 9.1-7.8) He went to the ER about 6 days later and then shortly after did est with GI at [facility] and is scheduled for EGD He has also seen hematology since our last OV and labs He overall is feeling much better, no syncope or near syncope  Chronic health issues and dx that need to be addressed - record from prior PCP not received yet HLD on statin and fenofibrate   GERD not on PPI he never opened it from the pharmacy.  He says he's not drinking alcohol and he's not taking any NSAIDs, no abd pain, blood in stool  COPD - on multiple inhalers, he states he needs symbicort  refiiled no recent exacerbations, he doesn't think he's ever consulted with pulmonology  Right shoulder pain onset 1 week ago , waking him up at night, no injury    Insomnia -unclear what treatment he has been on  possibly on amytriptyline for sleep?  No records from [place] [facility]  - not working Hasn't tried trazodone or seroquel      Current Outpatient Medications:    acetaminophen  (TYLENOL ) 325 MG tablet, Take 2 tablets (650 mg total) by mouth every 6 (six) hours as needed for mild pain (pain score 1-3) or fever.,  Disp: 30 tablet, Rfl: 0   albuterol  (VENTOLIN  HFA) 108 (90 Base) MCG/ACT inhaler, Inhale 2 puffs into the lungs every 6 (six) hours as needed for wheezing or shortness of breath., Disp: 1 Inhaler, Rfl: 1   amitriptyline  (ELAVIL ) 25 MG tablet, Take 25 mg by mouth at bedtime., Disp: , Rfl:    atorvastatin  (LIPITOR) 40 MG tablet, Take 1 tablet (40 mg total) by mouth every evening., Disp: 90 tablet, Rfl: 3   budesonide -formoterol  (SYMBICORT ) 80-4.5 MCG/ACT inhaler, Inhale 2 puffs into the lungs 2 (two) times daily., Disp: 1 Inhaler, Rfl: 1   cyanocobalamin (VITAMIN B12) 1000 MCG tablet, Take 1 tablet (1,000 mcg total) by mouth daily., Disp: 90 tablet, Rfl: 1   fenofibrate 160 MG tablet, Take 160 mg by mouth daily., Disp: , Rfl:    finasteride  (PROSCAR ) 5 MG tablet, Take 1 tablet (5 mg total) by mouth daily., Disp: 30 tablet, Rfl: 11   folic acid  (FOLVITE ) 1 MG tablet, Take 1 tablet (1 mg total) by mouth daily., Disp: 30 tablet, Rfl: 3   gabapentin  (NEURONTIN ) 300 MG capsule, Take 300 mg by mouth 2 (two) times daily., Disp: , Rfl:    pantoprazole  (PROTONIX ) 40 MG tablet, Take 1 tablet (40 mg total)  by mouth 2 (two) times daily., Disp: 60 tablet, Rfl: 3   senna-docusate (SENOKOT-S) 8.6-50 MG tablet, Take 1 tablet by mouth 2 (two) times daily., Disp: 30 tablet, Rfl: 0   SPIRIVA  HANDIHALER 18 MCG inhalation capsule, 1 capsule daily., Disp: , Rfl:    tamsulosin  (FLOMAX ) 0.4 MG CAPS capsule, Take 1 capsule (0.4 mg total) by mouth daily., Disp: 30 capsule, Rfl: 11   thiamine  (VITAMIN B-1) 100 MG tablet, Take 1 tablet (100 mg total) by mouth daily., Disp: 30 tablet, Rfl: 3  Patient Active Problem List   Diagnosis Date Noted   Iron  deficiency anemia due to chronic blood loss 10/15/2023   Peyronie's disease 10/07/2023   Former smoker 10/07/2023   RBBB 10/07/2023   Upper GI bleed 09/03/2023   Gastric ulcer without hemorrhage or perforation 09/02/2023   GI bleeding 09/01/2023   Acute blood loss anemia  09/01/2023   History of pelvic fracture 09/01/2023   Overweight (BMI 25.0-29.9) 09/01/2023   Alcohol abuse 09/01/2023   COPD (chronic obstructive pulmonary disease) (HCC)    GERD (gastroesophageal reflux disease)    BPH (benign prostatic hyperplasia)    Hyperlipidemia 06/24/2013   FHx: colon cancer 05/19/2013    Past Surgical History:  Procedure Laterality Date   ANKLE SURGERY     COLONOSCOPY WITH PROPOFOL  N/A 09/19/2020   Procedure: COLONOSCOPY WITH PROPOFOL ;  Surgeon: Luke Salaam, MD;  Location: [facility] ENDOSCOPY;  Service: Gastroenterology;  Laterality: N/A;   ESOPHAGOGASTRODUODENOSCOPY N/A 09/02/2023   Procedure: EGD (ESOPHAGOGASTRODUODENOSCOPY);  Surgeon: Marnee Sink, MD;  Location: [facility] ENDOSCOPY;  Service: Endoscopy;  Laterality: N/A;    Family History  Problem Relation Age of Onset   Heart attack Mother    Prostate cancer Father     Social History   Tobacco Use   Smoking status: Former    Current packs/day: 0.00    Average packs/day: 2.0 packs/day for 49.0 years (98.0 ttl pk-yrs)    Types: Cigarettes    Start date: 07/12/1971    Quit date: 07/11/2020    Years since quitting: 3.3   Smokeless tobacco: Never  Substance Use Topics   Alcohol use: Yes    Alcohol/week: 36.0 standard drinks of alcohol    Types: 36 Cans of beer per week   Drug use: Not Currently     No Known Allergies  Health Maintenance  Topic Date Due   Medicare Annual Wellness (AWV)  Never done   Hepatitis C Screening  Never done   COVID-19 Vaccine (4 - 2024-25 season) 02/02/2023   Zoster Vaccines- Shingrix (2 of 2) 01/07/2024 (Originally 08/05/2023)   INFLUENZA VACCINE  01/02/2024   Lung Cancer Screening  08/31/2024   Colonoscopy  09/20/2027   DTaP/Tdap/Td (4 - Td or Tdap) 07/13/2030   Pneumonia Vaccine 76+ Years old  Completed   HPV VACCINES  Aged Out   Meningococcal B Vaccine  Aged Out    Chart Review Today: I personally reviewed active problem list, medication list, allergies, family history,  social history, health maintenance, notes from last encounter, lab results, imaging with the patient/caregiver today.   Review of Systems  Constitutional: Negative.   HENT: Negative.    Eyes: Negative.   Respiratory: Negative.    Cardiovascular: Negative.   Gastrointestinal: Negative.   Endocrine: Negative.   Genitourinary: Negative.   Musculoskeletal: Negative.   Skin: Negative.   Allergic/Immunologic: Negative.   Neurological: Negative.   Hematological: Negative.   Psychiatric/Behavioral: Negative.    All other systems reviewed and are negative.  Objective:   Vitals:   11/10/23 1029  BP: 116/62  Pulse: 73  Temp: (!) 97.5 F (36.4 C)  TempSrc: Oral  SpO2: 97%  Weight: 161 lb 12.8 oz (73.4 kg)  Height: 5' 5 (1.651 m)    Body mass index is 26.92 kg/m.  Physical Exam Vitals and nursing note reviewed.  Constitutional:      General: He is not in acute distress.    Appearance: Normal appearance. He is well-developed. He is not ill-appearing, toxic-appearing or diaphoretic.  HENT:     Head: Normocephalic and atraumatic.     Right Ear: External ear normal.     Left Ear: External ear normal.     Nose: Nose normal.   Eyes:     General: No scleral icterus.       Right eye: No discharge.        Left eye: No discharge.     Conjunctiva/sclera: Conjunctivae normal.   Neck:     Trachea: No tracheal deviation.   Cardiovascular:     Rate and Rhythm: Normal rate.     Pulses: Normal pulses.     Heart sounds: Normal heart sounds.  Pulmonary:     Effort: Pulmonary effort is normal. No respiratory distress.     Breath sounds: No stridor.   Skin:    General: Skin is warm and dry.     Coloration: Skin is not pale.     Findings: No bruising or rash.   Neurological:     Mental Status: He is alert.     Motor: No abnormal muscle tone.     Coordination: Coordination normal.     Gait: Gait normal.   Psychiatric:        Mood and Affect: Mood normal.        Behavior:  Behavior normal.        Results for orders placed or performed during the hospital encounter of 10/13/23  CBC with Differential   Collection Time: 10/13/23 12:42 PM  Result Value Ref Range   WBC 6.5 4.0 - 10.5 K/uL   RBC 3.15 (L) 4.22 - 5.81 MIL/uL   Hemoglobin 7.9 (L) 13.0 - 17.0 g/dL   HCT 16.1 (L) 09.6 - 04.5 %   MCV 83.2 80.0 - 100.0 fL   MCH 25.1 (L) 26.0 - 34.0 pg   MCHC 30.2 30.0 - 36.0 g/dL   RDW 40.9 (H) 81.1 - 91.4 %   Platelets 739 (H) 150 - 400 K/uL   nRBC 0.0 0.0 - 0.2 %   Neutrophils Relative % 50 %   Neutro Abs 3.3 1.7 - 7.7 K/uL   Lymphocytes Relative 34 %   Lymphs Abs 2.2 0.7 - 4.0 K/uL   Monocytes Relative 13 %   Monocytes Absolute 0.8 0.1 - 1.0 K/uL   Eosinophils Relative 2 %   Eosinophils Absolute 0.1 0.0 - 0.5 K/uL   Basophils Relative 1 %   Basophils Absolute 0.1 0.0 - 0.1 K/uL   Immature Granulocytes 0 %   Abs Immature Granulocytes 0.02 0.00 - 0.07 K/uL  Comprehensive metabolic panel   Collection Time: 10/13/23 12:42 PM  Result Value Ref Range   Sodium 133 (L) 135 - 145 mmol/L   Potassium 4.3 3.5 - 5.1 mmol/L   Chloride 102 98 - 111 mmol/L   CO2 26 22 - 32 mmol/L   Glucose, Bld 103 (H) 70 - 99 mg/dL   BUN 13 8 - 23 mg/dL   Creatinine, Ser 7.82 0.61 -  1.24 mg/dL   Calcium  8.8 (L) 8.9 - 10.3 mg/dL   Total Protein 6.8 6.5 - 8.1 g/dL   Albumin 3.4 (L) 3.5 - 5.0 g/dL   AST 18 15 - 41 U/L   ALT 17 0 - 44 U/L   Alkaline Phosphatase 61 38 - 126 U/L   Total Bilirubin 0.5 0.0 - 1.2 mg/dL   GFR, Estimated >16 >10 mL/min   Anion gap 5 5 - 15  Type and screen [facility]   Collection Time: 10/13/23 12:47 PM  Result Value Ref Range   ABO/RH(D) O POS    Antibody Screen NEG    Sample Expiration      10/16/2023,2359 Performed at [facility], [address] [place], [place] [address]       Assessment & Plan:   Acute blood loss anemia Assessment & Plan: Pt hemoglobin rechecked at his initial appt and was 7.0, he  was directed to the ED, rechecked there about 6 d later and it was 7.9 so he was not transfused PRBC, he did get est with hematology and started iron  infusions He is symptomatically feeling better Recheck CBC today and he has hematology f/up in August   Orders: -     CBC with Differential/Platelet  Chronic obstructive pulmonary disease, unspecified COPD type (HCC) Assessment & Plan: On symbicort , spiriva  and has albuterol  rescue inhaler Not est with pulm  Orders: -     Budesonide -Formoterol  Fumarate; Inhale 2 puffs into the lungs 2 (two) times daily.  Dispense: 1 each; Refill: 1 -     Spiriva  HandiHaler; Place 1 capsule (18 mcg total) into inhaler and inhale daily.  Dispense: 30 capsule; Refill: 2 -     Pulmonary Visit  Gastroesophageal reflux disease with esophagitis, unspecified whether hemorrhage Assessment & Plan: He has not started protonix  Encouraged him to start BID and then plan to decrease to once daily GI consulted at [facility] and he has EGD set up Advised again to avoid ETOH and all NSAIDs, additionally avoid food trigger   Hyperlipidemia, unspecified hyperlipidemia type Assessment & Plan: Lipids ordered today - same as last OV, no PCP records received yet   On multiple meds and no lipids available in EMR We requested records from his past [facility] with a [facility] Unclear why he would need multiple medications, did discontinue pravastatin it does look like he is being prescribed atorvastatin  and I am not sure why he is also on fenofibrate with history of alcoholism possibly has high triglycerides but I am concerned about the stress that may also have on the liver Requesting records, labs today Anticipate simplifying medications with increase atorvastatin  dose or changing to Crestor From what I can see there is no atherosclerotic disease known or noted in the chart  Orders: -     Comprehensive metabolic panel with GFR -     Lipid  panel  Neuropathy Assessment & Plan: Still pending records request from prior PCP to see hx of leg pain? Neuropathy? Insomnia? Iron  infusions would help if iron  deficiency is contributing to neuropathy, other supplements for prior ETOH abuse recommended He can try lyrica    Orders: -     Pregabalin ; Take 1 capsule (25 mg total) by mouth 2 (two) times daily.  Dispense: 180 capsule; Refill: 0  Insomnia, unspecified type Assessment & Plan: Unclear what he was getting from prior PCP Possibly amitriptyline  We discussed other medications such as trazodone or trying Seroquel   Encounter for medication  monitoring -     CBC with Differential/Platelet -     Comprehensive metabolic panel with GFR -     Lipid panel  Severe major depressive disorder (HCC) Assessment & Plan: PHQ 9 is positive - he is not sleeping well and he may have been on amitriptyline  for mood/sleep/neuropathy, unclear and pt doesn't know Still waiting for records from community health center PHQ 9 reviewed today and positive for moderate to severe depressive sx, pt pleasant and denies HI, SI, AVH    11/10/2023   10:48 AM 10/15/2023    2:28 PM 10/07/2023    2:16 PM  Depression screen PHQ 2/9  Decreased Interest 3 0 0  Down, Depressed, Hopeless 3 0 0  PHQ - 2 Score 6 0 0  Altered sleeping 3    Tired, decreased energy 3    Change in appetite 0    Feeling bad or failure about yourself  3    Trouble concentrating 3    Moving slowly or fidgety/restless 3    Suicidal thoughts 0    PHQ-9 Score 21    Difficult doing work/chores Extremely dIfficult        Acute pain of right shoulder -     Ambulatory referral to Orthopedic Surgery New complaint onset 1 week ago w/o injury - discussed with pt that he could wait and see if it improves or if he wishes can do ortho consult - defer imaging or eval to ortho  He wishes for referral    Return in about 3 months (around 02/10/2024) for Routine follow-up.   Adeline Hone,  PA-C 11/10/23 10:10 AM

## 2023-11-10 NOTE — Assessment & Plan Note (Signed)
 Pt hemoglobin rechecked at his initial appt and was 7.0, he was directed to the ED, rechecked there about 6 d later and it was 7.9 so he was not transfused PRBC, he did get est with hematology and started iron  infusions He is symptomatically feeling better Recheck CBC today and he has hematology f/up in August

## 2023-11-10 NOTE — Assessment & Plan Note (Signed)
 On symbicort , spiriva  and has albuterol  rescue inhaler Not est with pulm

## 2023-11-10 NOTE — Patient Instructions (Addendum)
 Makes sure you are taking pantoprazole  40mg  twice a day until you do the upper endoscopy with GI.  I hope to be able to get you back to once a day. No alcohol, NSAIDs (ibuprofen , aleve , naproxen )   I prescribed lyrica for you to take twice a day - for your feet pain, we will adjust this dose to try and make sure it helps with your pain.  I am going to try and get you a new medicine to try for your insomnia  You should get a call from ortho to make an appointment to evaluate you for your shoulder pain.

## 2023-11-10 NOTE — Assessment & Plan Note (Signed)
 He has not started protonix  Encouraged him to start BID and then plan to decrease to once daily GI consulted at [facility] and he has EGD set up Advised again to avoid ETOH and all NSAIDs, additionally avoid food trigger

## 2023-11-10 NOTE — Assessment & Plan Note (Signed)
 PHQ 9 is positive - he is not sleeping well and he may have been on amitriptyline  for mood/sleep/neuropathy, unclear and pt doesn't know Still waiting for records from community health [facility] PHQ 9 reviewed today and positive for moderate to severe depressive sx, pt pleasant and denies HI, SI, AVH    11/10/2023   10:48 AM 10/15/2023    2:28 PM 10/07/2023    2:16 PM  Depression screen PHQ 2/9  Decreased Interest 3 0 0  Down, Depressed, Hopeless 3 0 0  PHQ - 2 Score 6 0 0  Altered sleeping 3    Tired, decreased energy 3    Change in appetite 0    Feeling bad or failure about yourself  3    Trouble concentrating 3    Moving slowly or fidgety/restless 3    Suicidal thoughts 0    PHQ-9 Score 21    Difficult doing work/chores Extremely dIfficult

## 2023-11-11 ENCOUNTER — Ambulatory Visit: Payer: Self-pay | Admitting: Family Medicine

## 2023-11-11 LAB — CBC WITH DIFFERENTIAL/PLATELET
Absolute Lymphocytes: 2214 {cells}/uL (ref 850–3900)
Absolute Monocytes: 858 {cells}/uL (ref 200–950)
Basophils Absolute: 51 {cells}/uL (ref 0–200)
Basophils Relative: 0.8 %
Eosinophils Absolute: 102 {cells}/uL (ref 15–500)
Eosinophils Relative: 1.6 %
HCT: 37.3 % — ABNORMAL LOW (ref 38.5–50.0)
Hemoglobin: 11.3 g/dL — ABNORMAL LOW (ref 13.2–17.1)
MCH: 26.3 pg — ABNORMAL LOW (ref 27.0–33.0)
MCHC: 30.3 g/dL — ABNORMAL LOW (ref 32.0–36.0)
MCV: 86.9 fL (ref 80.0–100.0)
MPV: 10.3 fL (ref 7.5–12.5)
Monocytes Relative: 13.4 %
Neutro Abs: 3174 {cells}/uL (ref 1500–7800)
Neutrophils Relative %: 49.6 %
Platelets: 506 10*3/uL — ABNORMAL HIGH (ref 140–400)
RBC: 4.29 10*6/uL (ref 4.20–5.80)
RDW: 21.2 % — ABNORMAL HIGH (ref 11.0–15.0)
Total Lymphocyte: 34.6 %
WBC: 6.4 10*3/uL (ref 3.8–10.8)

## 2023-11-11 LAB — COMPREHENSIVE METABOLIC PANEL WITH GFR
AG Ratio: 1.7 (calc) (ref 1.0–2.5)
ALT: 17 U/L (ref 9–46)
AST: 20 U/L (ref 10–35)
Albumin: 4.2 g/dL (ref 3.6–5.1)
Alkaline phosphatase (APISO): 50 U/L (ref 35–144)
BUN: 11 mg/dL (ref 7–25)
CO2: 27 mmol/L (ref 20–32)
Calcium: 9.5 mg/dL (ref 8.6–10.3)
Chloride: 102 mmol/L (ref 98–110)
Creat: 1.11 mg/dL (ref 0.70–1.28)
Globulin: 2.5 g/dL (ref 1.9–3.7)
Glucose, Bld: 79 mg/dL (ref 65–99)
Potassium: 5 mmol/L (ref 3.5–5.3)
Sodium: 137 mmol/L (ref 135–146)
Total Bilirubin: 0.4 mg/dL (ref 0.2–1.2)
Total Protein: 6.7 g/dL (ref 6.1–8.1)
eGFR: 71 mL/min/{1.73_m2} (ref >=60)

## 2023-11-11 LAB — LIPID PANEL
Cholesterol: 130 mg/dL (ref <200)
HDL: 54 mg/dL (ref >=40)
LDL Cholesterol (Calc): 58 mg/dL
Non-HDL Cholesterol (Calc): 76 mg/dL (ref <130)
Total CHOL/HDL Ratio: 2.4 (calc) (ref <5.0)
Triglycerides: 92 mg/dL (ref <150)

## 2023-11-12 ENCOUNTER — Encounter: Payer: Self-pay | Admitting: Family Medicine

## 2023-11-17 NOTE — Assessment & Plan Note (Signed)
 Still pending records request from prior PCP to see hx of leg pain? Neuropathy? Insomnia? Iron  infusions would help if iron  deficiency is contributing to neuropathy, other supplements for prior ETOH abuse recommended He can try lyrica 

## 2023-11-17 NOTE — Assessment & Plan Note (Signed)
 Unclear what he was getting from prior PCP Possibly amitriptyline  We discussed other medications such as trazodone or trying Seroquel

## 2023-11-18 ENCOUNTER — Ambulatory Visit
Admission: RE | Admit: 2023-11-18 | Discharge: 2023-11-18 | Disposition: A | Discharge status: Home / Self Care | Source: Ambulatory Visit | Attending: Acute Care | Admitting: Acute Care

## 2023-11-18 DIAGNOSIS — Z122 Encounter for screening for malignant neoplasm of respiratory organs: Secondary | ICD-10-CM | POA: Diagnosis present

## 2023-11-18 DIAGNOSIS — Z87891 Personal history of nicotine dependence: Secondary | ICD-10-CM | POA: Diagnosis present

## 2023-11-19 ENCOUNTER — Encounter: Payer: Self-pay | Admitting: Orthopedic Surgery

## 2023-11-19 ENCOUNTER — Ambulatory Visit: Admitting: Orthopedic Surgery

## 2023-11-19 DIAGNOSIS — M25511 Pain in right shoulder: Secondary | ICD-10-CM | POA: Diagnosis not present

## 2023-11-19 DIAGNOSIS — G8929 Other chronic pain: Secondary | ICD-10-CM | POA: Diagnosis not present

## 2023-11-19 NOTE — Patient Instructions (Signed)

## 2023-11-19 NOTE — Progress Notes (Signed)
 New Patient Visit  Assessment: Rodney Howe is a 73 y.o. male with the following: 1. Chronic right shoulder pain  Plan: Rodney Howe has pain in his right shoulder.  He reports a couple of episodes, with the most recent episode being a fall onto his back about 2 and half months ago.  He has good motion, but does have some weakness and pain with strength testing.  Radiographs are without acute injury.  No evidence of proximal humeral migration.  He is interested in a steroid injection.  This was completed in clinic today.  He will return to clinic as needed.   Procedure note injection - Right shoulder    Verbal consent was obtained to inject the right shoulder, subacromial space Timeout was completed to confirm the site of injection.   The skin was prepped with alcohol and ethyl chloride was sprayed at the injection site.  A 21-gauge needle was used to inject 40 mg of Depo-Medrol  and 1% lidocaine  (4 cc) into the subacromial space of the right shoulder using a posterolateral approach.  There were no complications.  A sterile bandage was applied.    Follow-up: Return if symptoms worsen or fail to improve.  Subjective:  Chief Complaint  Patient presents with   Shoulder Pain    Right shoulder pain    History of Present Illness: Rodney Howe is a 73 y.o. male who has been referred by  Adeline Hone, PA-C for evaluation of right shoulder pain.  He states he has had some pain in the shoulder in the past.  More recently, he fell off the back of a boat, approximately 8 feet down and landed flat on his back on concrete.  Since then, he has had more pain in the right shoulder.  He did take some medicine for inflammation, but this led to some ulcers.  He is no longer taking NSAIDs.  His function is pretty good, but he does continue to have some pain.  No prior injections.  He has obtained an x-ray, which were negative for acute fracture.   Review of Systems: No fevers  or chills No numbness or tingling No chest pain No shortness of breath No bowel or bladder dysfunction No GI distress No headaches   Medical History:  Past Medical History:  Diagnosis Date   BPH (benign prostatic hyperplasia)    COPD (chronic obstructive pulmonary disease) (HCC)    COPD (chronic obstructive pulmonary disease) (HCC)    GERD (gastroesophageal reflux disease)    HLD (hyperlipidemia)     Past Surgical History:  Procedure Laterality Date   ANKLE SURGERY     COLONOSCOPY WITH PROPOFOL  N/A 09/19/2020   Procedure: COLONOSCOPY WITH PROPOFOL ;  Surgeon: Luke Salaam, MD;  Location: [facility] ENDOSCOPY;  Service: Gastroenterology;  Laterality: N/A;   ESOPHAGOGASTRODUODENOSCOPY N/A 09/02/2023   Procedure: EGD (ESOPHAGOGASTRODUODENOSCOPY);  Surgeon: Marnee Sink, MD;  Location: [facility] ENDOSCOPY;  Service: Endoscopy;  Laterality: N/A;    Family History  Problem Relation Age of Onset   Heart attack Mother    Prostate cancer Father    Social History   Tobacco Use   Smoking status: Former    Current packs/day: 0.00    Average packs/day: 2.0 packs/day for 49.0 years (98.0 ttl pk-yrs)    Types: Cigarettes    Start date: 07/12/1971    Quit date: 07/11/2020    Years since quitting: 3.3   Smokeless tobacco: Never  Substance Use Topics   Alcohol use: Yes  Alcohol/week: 36.0 standard drinks of alcohol    Types: 36 Cans of beer per week   Drug use: Not Currently    No Known Allergies  Current Meds  Medication Sig   albuterol  (VENTOLIN  HFA) 108 (90 Base) MCG/ACT inhaler Inhale 2 puffs into the lungs every 6 (six) hours as needed for wheezing or shortness of breath.   amitriptyline  (ELAVIL ) 25 MG tablet Take 25 mg by mouth at bedtime.   atorvastatin  (LIPITOR) 40 MG tablet Take 1 tablet (40 mg total) by mouth every evening.   budesonide -formoterol  (SYMBICORT ) 80-4.5 MCG/ACT inhaler Inhale 2 puffs into the lungs 2 (two) times daily.   cyanocobalamin (VITAMIN B12) 1000 MCG tablet Take  1 tablet (1,000 mcg total) by mouth daily.   fenofibrate 160 MG tablet Take 160 mg by mouth daily.   finasteride  (PROSCAR ) 5 MG tablet Take 1 tablet (5 mg total) by mouth daily.   folic acid  (FOLVITE ) 1 MG tablet Take 1 tablet (1 mg total) by mouth daily.   pantoprazole  (PROTONIX ) 40 MG tablet Take 1 tablet (40 mg total) by mouth 2 (two) times daily.   pregabalin  (LYRICA ) 25 MG capsule Take 1 capsule (25 mg total) by mouth 2 (two) times daily.   senna-docusate (SENOKOT-S) 8.6-50 MG tablet Take 1 tablet by mouth 2 (two) times daily.   SPIRIVA  HANDIHALER 18 MCG inhalation capsule Place 1 capsule (18 mcg total) into inhaler and inhale daily.   tamsulosin  (FLOMAX ) 0.4 MG CAPS capsule Take 1 capsule (0.4 mg total) by mouth daily.   thiamine  (VITAMIN B-1) 100 MG tablet Take 1 tablet (100 mg total) by mouth daily.    Objective: There were no vitals taken for this visit.  Physical Exam:  General: Alert and oriented. and No acute distress. Gait: Normal gait.  Right shoulder without deformity.  No redness.  No swelling.  He has good range of motion.  Weakness with deltoid and supraspinatus strength testing.  Mild tenderness to palpation over the anterior lateral aspect the right shoulder.  Fingers warm well-perfused.  Sensation intact distally.  IMAGING: I personally reviewed images previously obtained in clinic  X-rays of the right shoulder were previously obtained.  No degenerative changes.  No evidence of proximal humeral migration.   New Medications:  No orders of the defined types were placed in this encounter.     Tonita Frater, MD  11/19/2023 2:16 PM

## 2023-12-02 ENCOUNTER — Telehealth: Payer: Self-pay | Admitting: Acute Care

## 2023-12-02 DIAGNOSIS — Z87891 Personal history of nicotine dependence: Secondary | ICD-10-CM

## 2023-12-02 DIAGNOSIS — R911 Solitary pulmonary nodule: Secondary | ICD-10-CM

## 2023-12-02 NOTE — Telephone Encounter (Signed)
 Call report received:   IMPRESSION: 1. Lung-RADS 4A, suspicious. Follow up low-dose chest CT without contrast in 3 months (please use the following order, CT CHEST LCS NODULE FOLLOW-UP W/O CM) is recommended. New 6.8 mm indistinct posteromedial basilar right lower lobe solid pulmonary nodule. 2. Three-vessel coronary atherosclerosis. 3. Small to moderate hiatal hernia. 4. Aortic Atherosclerosis (ICD10-I70.0) and Emphysema (ICD10-J43.9).   These results will be called to the ordering clinician or representative by the Radiologist Assistant, and communication documented in the PACS or [employer].     Electronically Signed   By: Selinda DELENA Blue M.D.   On: 12/01/2023 17:39

## 2023-12-10 NOTE — Telephone Encounter (Signed)
 Spoke with pt and advised per Sarah Groce, NP that pt will need repeat C in 3 months. PT verbalized understanding. CT scheduled 02/19/24 8:00.

## 2023-12-10 NOTE — Addendum Note (Signed)
 Addended by: ANITRA AQUAS D on: 12/10/2023 04:10 PM   Modules accepted: Orders

## 2023-12-10 NOTE — Telephone Encounter (Signed)
 Patient called in requested results. Advised radiologist requested 3 month f/u due to new nodule, however patient is aware results are being held for [facility], NP to review. Pt is aware he will get a call alter this week with f/u plan.

## 2023-12-21 ENCOUNTER — Other Ambulatory Visit: Payer: Self-pay | Admitting: Family Medicine

## 2023-12-21 DIAGNOSIS — K259 Gastric ulcer, unspecified as acute or chronic, without hemorrhage or perforation: Secondary | ICD-10-CM

## 2023-12-21 DIAGNOSIS — J449 Chronic obstructive pulmonary disease, unspecified: Secondary | ICD-10-CM

## 2023-12-21 DIAGNOSIS — D62 Acute posthemorrhagic anemia: Secondary | ICD-10-CM

## 2023-12-23 ENCOUNTER — Ambulatory Visit (INDEPENDENT_AMBULATORY_CARE_PROVIDER_SITE_OTHER): Admitting: Dermatology

## 2023-12-23 ENCOUNTER — Encounter: Payer: Self-pay | Admitting: Dermatology

## 2023-12-23 DIAGNOSIS — Z7189 Other specified counseling: Secondary | ICD-10-CM

## 2023-12-23 DIAGNOSIS — L821 Other seborrheic keratosis: Secondary | ICD-10-CM

## 2023-12-23 DIAGNOSIS — W908XXA Exposure to other nonionizing radiation, initial encounter: Secondary | ICD-10-CM

## 2023-12-23 DIAGNOSIS — L578 Other skin changes due to chronic exposure to nonionizing radiation: Secondary | ICD-10-CM

## 2023-12-23 DIAGNOSIS — L570 Actinic keratosis: Secondary | ICD-10-CM | POA: Diagnosis not present

## 2023-12-23 DIAGNOSIS — L814 Other melanin hyperpigmentation: Secondary | ICD-10-CM | POA: Diagnosis not present

## 2023-12-23 DIAGNOSIS — D229 Melanocytic nevi, unspecified: Secondary | ICD-10-CM

## 2023-12-23 DIAGNOSIS — D1801 Hemangioma of skin and subcutaneous tissue: Secondary | ICD-10-CM | POA: Diagnosis not present

## 2023-12-23 DIAGNOSIS — Z1283 Encounter for screening for malignant neoplasm of skin: Secondary | ICD-10-CM

## 2023-12-23 MED ORDER — FLUOROURACIL 5 % EX CREA: TOPICAL_CREAM | CUTANEOUS | 0 refills | Status: AC

## 2023-12-23 MED ORDER — FLUOROURACIL 5 % EX CREA: TOPICAL_CREAM | CUTANEOUS | 0 refills | Status: DC

## 2023-12-23 NOTE — Patient Instructions (Addendum)
 - Start 5-fluorouracil  cream twice a day until redness and inflammation occur to affected areas including nose, right hand.  Reviewed course of treatment and expected reaction.  Patient advised to expect inflammation and crusting and advised that erosions are possible.  Patient advised to be diligent with sun protection during and after treatment. Handout with details of how to apply medication and what to expect provided. Counseled to keep medication out of reach of children and pets.  Melanoma ABCDEs  Melanoma is the most dangerous type of skin cancer, and is the leading cause of death from skin disease.  You are more likely to develop melanoma if you: Have light-colored skin, light-colored eyes, or red or blond hair Spend a lot of time in the sun Tan regularly, either outdoors or in a tanning bed Have had blistering sunburns, especially during childhood Have a close family member who has had a melanoma Have atypical moles or large birthmarks  Early detection of melanoma is key since treatment is typically straightforward and cure rates are extremely high if we catch it early.   The first sign of melanoma is often a change in a mole or a new dark spot.  The ABCDE system is a way of remembering the signs of melanoma.  A for asymmetry:  The two halves do not match. B for border:  The edges of the growth are irregular. C for color:  A mixture of colors are present instead of an even brown color. D for diameter:  Melanomas are usually (but not always) greater than 6mm - the size of a pencil eraser. E for evolution:  The spot keeps changing in size, shape, and color.  Please check your skin once per month between visits. You can use a small mirror in front and a large mirror behind you to keep an eye on the back side or your body.   If you see any new or changing lesions before your next follow-up, please call to schedule a visit.  Please continue daily skin protection including broad spectrum  sunscreen SPF 30+ to sun-exposed areas, reapplying every 2 hours as needed when you're outdoors.    Due to recent changes in healthcare laws, you may see results of your pathology and/or laboratory studies on MyChart before the doctors have had a chance to review them. We understand that in some cases there may be results that are confusing or concerning to you. Please understand that not all results are received at the same time and often the doctors may need to interpret multiple results in order to provide you with the best plan of care or course of treatment. Therefore, we ask that you please give us  2 business days to thoroughly review all your results before contacting the office for clarification. Should we see a critical lab result, you will be contacted sooner.   If You Need Anything After Your Visit  If you have any questions or concerns for your doctor, please call our main line at [phone] and press option 4 to reach your doctor's medical assistant. If no one answers, please leave a voicemail as directed and we will return your call as soon as possible. Messages left after 4 pm will be answered the following business day.   You may also send us  a message via MyChart. We typically respond to MyChart messages within 1-2 business days.  For prescription refills, please ask your pharmacy to contact our office. Our fax number is [phone].  If you have an  urgent issue when the clinic is closed that cannot wait until the next business day, you can page your doctor at the number below.    Please note that while we do our best to be available for urgent issues outside of office hours, we are not available 24/7.   If you have an urgent issue and are unable to reach us , you may choose to seek medical care at your doctor's office, retail clinic, urgent care center, or emergency room.  If you have a medical emergency, please immediately call 911 or go to the emergency department.  Pager  Numbers  - Dr. Hester: [phone]  - Dr. Jackquline: [phone]  - Dr. Claudene: [phone]   In the event of inclement weather, please call our main line at [phone] for an update on the status of any delays or closures.  Dermatology Medication Tips: Please keep the boxes that topical medications come in in order to help keep track of the instructions about where and how to use these. Pharmacies typically print the medication instructions only on the boxes and not directly on the medication tubes.   If your medication is too expensive, please contact our office at [phone] option 4 or send us  a message through MyChart.   We are unable to tell what your co-pay for medications will be in advance as this is different depending on your insurance coverage. However, we may be able to find a substitute medication at lower cost or fill out paperwork to get insurance to cover a needed medication.   If a prior authorization is required to get your medication covered by your insurance company, please allow us  1-2 business days to complete this process.  Drug prices often vary depending on where the prescription is filled and some pharmacies may offer cheaper prices.  The website www.goodrx.com contains coupons for medications through different pharmacies. The prices here do not account for what the cost may be with help from insurance (it may be cheaper with your insurance), but the website can give you the price if you did not use any insurance.  - You can print the associated coupon and take it with your prescription to the pharmacy.  - You may also stop by our office during regular business hours and pick up a GoodRx coupon card.  - If you need your prescription sent electronically to a different pharmacy, notify our office through [facility] or by phone at [phone] option 4.     Si Usted Necesita Algo Despus de Su Visita  Tambin puede enviarnos un mensaje a travs de  Clinical cytogeneticist. Por lo general respondemos a los mensajes de MyChart en el transcurso de 1 a 2 das hbiles.  Para renovar recetas, por favor pida a su farmacia que se ponga en contacto con nuestra oficina. Randi lakes de fax es [place] [phone].  Si tiene un asunto urgente cuando la clnica est cerrada y que no puede esperar hasta el siguiente da hbil, puede llamar/localizar a su doctor(a) al nmero que aparece a continuacin.   Por favor, tenga en cuenta que aunque hacemos todo lo posible para estar disponibles para asuntos urgentes fuera del horario de [place], no estamos disponibles las 24 horas del da, los 7 [address]  [address] de la [place].   Si tiene un problema urgente y no puede comunicarse con nosotros, puede optar por buscar atencin mdica  en el consultorio de su doctor(a), en una clnica privada, en un centro de atencin urgente o en una sala de  emergencias.  Si tiene Engineer, drilling, por favor llame inmediatamente al 911 o vaya a la sala de emergencias.  Nmeros de bper  - Dr. Hester: [phone]  - Dra. Jackquline: [phone]  - Dr. Claudene: [phone]   En caso de inclemencias del tiempo, por favor llame a landry capes principal al [phone] para una actualizacin sobre el [place] de cualquier retraso o cierre.  Consejos para la medicacin en dermatologa: Por favor, guarde las cajas en las que vienen los medicamentos de uso tpico para ayudarle a seguir las instrucciones sobre dnde y cmo usarlos. Las farmacias generalmente imprimen las instrucciones del medicamento slo en las cajas y no directamente en los tubos del [place].   Si su medicamento es muy caro, por favor, pngase en contacto con landry rieger llamando al [phone] y presione la opcin 4 o envenos un mensaje a travs de Clinical cytogeneticist.   No podemos decirle cul ser su copago por los medicamentos por adelantado ya que esto es diferente dependiendo de la cobertura de su seguro. Sin embargo, es posible que  podamos encontrar un medicamento sustituto a Audiological scientist un formulario para que el seguro cubra el medicamento que se considera necesario.   Si se requiere una autorizacin previa para que su compaa de seguros malta su medicamento, por favor permtanos de 1 a 2 das hbiles para completar este proceso.  Los precios de los medicamentos varan con frecuencia dependiendo del Environmental consultant de dnde se surte la receta y alguna farmacias pueden ofrecer precios ms baratos.  El sitio web www.goodrx.com tiene cupones para medicamentos de Health and safety inspector. Los precios aqu no tienen en cuenta lo que podra costar con la ayuda del seguro (puede ser ms barato con su seguro), pero el sitio web puede darle el precio si no utiliz Tourist information centre manager.  - Puede imprimir el cupn correspondiente y llevarlo con su receta a la farmacia.  - Tambin puede pasar por nuestra oficina durante el horario de atencin regular y Education officer, museum una tarjeta de cupones de GoodRx.  - Si necesita que su receta se enve electrnicamente a una farmacia diferente, informe a nuestra oficina a travs de MyChart de [place] o por telfono llamando al [phone] y presione la opcin 4.

## 2023-12-23 NOTE — Telephone Encounter (Signed)
 Requested Prescriptions  Pending Prescriptions Disp Refills   SYMBICORT  80-4.5 MCG/ACT inhaler [Pharmacy Med Name: Symbicort  80-4.5 MCG/ACT Inhalation Aerosol] 20.4 g 5    Sig: USE 2 INHALATIONS BY MOUTH TWICE DAILY     Pulmonology:  Combination Products Passed - 12/23/2023 12:02 PM      Passed - Valid encounter within last 12 months    Recent Outpatient Visits           1 month ago Acute blood loss anemia   [facility] Leavy Mole, PA-C   2 months ago Encounter to establish care with new doctor   [facility] Leavy Mole, [place]       Future Appointments             In 3 months Claudene Lehmann, MD [facility]   In 1 year Claudene Lehmann, MD [facility]            Refused Prescriptions Disp Refills   pantoprazole  (PROTONIX ) 40 MG tablet [Pharmacy Med Name: Pantoprazole  Sodium 40 MG Oral Tablet Delayed Release] 200 tablet 2    Sig: TAKE 1 TABLET BY MOUTH TWICE  DAILY     Gastroenterology: Proton Pump Inhibitors Passed - 12/23/2023 12:02 PM      Passed - Valid encounter within last 12 months    Recent Outpatient Visits           1 month ago Acute blood loss anemia   [facility] Leavy Mole, PA-C   2 months ago Encounter to establish care with new doctor   [facility] Leavy Mole, [place]       Future Appointments             In 3 months Claudene Lehmann, MD [facility]   In 1 year Claudene Lehmann, MD [facility]

## 2023-12-23 NOTE — Progress Notes (Signed)
 New Patient Visit   Subjective  Rodney Howe is a 73 y.o. male who presents for the following: Skin Cancer Screening and Full Body Skin Exam  The patient presents for Total-Body Skin Exam (TBSE) for skin cancer screening and mole check. The patient has spots, moles and lesions to be evaluated, some may be new or changing and the patient may have concern these could be cancer.    The following portions of the chart were reviewed this encounter and updated as appropriate: medications, allergies, medical history  Review of Systems:  No other skin or systemic complaints except as noted in HPI or Assessment and Plan.  Objective  Well appearing patient in no apparent distress; mood and affect are within normal limits.  A full examination was performed including scalp, head, eyes, ears, nose, lips, neck, chest, axillae, abdomen, back, buttocks, bilateral upper extremities, bilateral lower extremities, hands, feet, fingers, toes, fingernails, and toenails. All findings within normal limits unless otherwise noted below.   Relevant physical exam findings are noted in the Assessment and Plan.    Assessment & Plan   SKIN CANCER SCREENING PERFORMED TODAY.  ACTINIC DAMAGE WITH PRECANCEROUS ACTINIC KERATOSES Counseling for Topical Chemotherapy Management: Patient exhibits: - Severe, confluent actinic changes with pre-cancerous actinic keratoses that is secondary to cumulative UV radiation exposure over time - Condition that is severe; chronic, not at goal. - diffuse scaly erythematous macules and papules with underlying dyspigmentation - Discussed Prescription Field Treatment topical Chemotherapy for Severe, Chronic Confluent Actinic Changes with Pre-Cancerous Actinic Keratoses Field treatment involves treatment of an entire area of skin that has confluent Actinic Changes (Sun/ Ultraviolet light damage) and PreCancerous Actinic Keratoses by method of PhotoDynamic Therapy (PDT) and/or  prescription Topical Chemotherapy agents such as 5-fluorouracil , 5-fluorouracil /calcipotriene, and/or imiquimod.  The purpose is to decrease the number of clinically evident and subclinical PreCancerous lesions to prevent progression to development of skin cancer by chemically destroying early precancer changes that may or may not be visible.  It has been shown to reduce the risk of developing skin cancer in the treated area. As a result of treatment, redness, scaling, crusting, and open sores may occur during treatment course. One or more than one of these methods may be used and may have to be used several times to control, suppress and eliminate the PreCancerous changes. Discussed treatment course, expected reaction, and possible side effects. - Recommend daily broad spectrum sunscreen SPF 30+ to sun-exposed areas, reapply every 2 hours as needed.  - Staying in the shade or wearing long sleeves, sun glasses (UVA+UVB protection) and wide brim hats (4-inch brim around the entire circumference of the hat) are also recommended. - Call for new or changing lesions. - Start 5-fluorouracil  cream twice a day until redness and inflammation occur to affected areas including nose, right dorsal hand.  Reviewed course of treatment and expected reaction.  Patient advised to expect inflammation and crusting and advised that erosions are possible.  Patient advised to be diligent with sun protection during and after treatment. Handout with details of how to apply medication and what to expect provided. Counseled to keep medication out of reach of children and pets.    LENTIGINES, SEBORRHEIC KERATOSES, HEMANGIOMAS - Benign normal skin lesions - Benign-appearing - Call for any changes  MELANOCYTIC NEVI - Tan-brown and/or pink-flesh-colored symmetric macules and papules - Benign appearing on exam today - Observation - Call clinic for new or changing moles - Recommend daily use of broad spectrum spf 30+ sunscreen to  sun-exposed areas.     ACTINIC SKIN DAMAGE   Related Medications fluorouracil  (EFUDEX ) 5 % cream Apply twice daily to nose, right hand until redness and irritation occurs. MULTIPLE BENIGN NEVI   LENTIGINES   ACTINIC ELASTOSIS   SEBORRHEIC KERATOSES   CHERRY ANGIOMA   ACTINIC KERATOSES    Return in about 3 months (around 03/24/2024) for AK follow up, with Dr. Claudene and 1 year TBSE.  LILLETTE Lonell Drones, RMA, am acting as scribe for Boneta Claudene, MD .   Documentation: I have reviewed the above documentation for accuracy and completeness, and I agree with the above.  Boneta Claudene, MD

## 2023-12-24 ENCOUNTER — Encounter: Payer: Self-pay | Admitting: Pulmonary Disease

## 2023-12-24 ENCOUNTER — Ambulatory Visit: Admitting: Pulmonary Disease

## 2023-12-24 VITALS — BP 110/60 | HR 90 | Temp 97.3°F | Ht 65.0 in | Wt 165.4 lb

## 2023-12-24 DIAGNOSIS — J4489 Other specified chronic obstructive pulmonary disease: Secondary | ICD-10-CM

## 2023-12-24 MED ORDER — TRELEGY ELLIPTA 100-62.5-25 MCG/ACT IN AEPB: 1.0000 | INHALATION_SPRAY | Freq: Every day | RESPIRATORY_TRACT | 3 refills | Status: AC

## 2023-12-24 MED ORDER — TRELEGY ELLIPTA 100-62.5-25 MCG/ACT IN AEPB: 1.0000 | INHALATION_SPRAY | Freq: Every day | RESPIRATORY_TRACT | 0 refills | Status: DC

## 2023-12-29 ENCOUNTER — Encounter: Payer: Self-pay | Admitting: Anesthesiology

## 2023-12-29 ENCOUNTER — Ambulatory Visit
Admission: RE | Admit: 2023-12-29 | Discharge: 2023-12-29 | Disposition: A | Discharge status: Home / Self Care | Source: Ambulatory Visit | Attending: Gastroenterology | Admitting: Gastroenterology

## 2023-12-29 ENCOUNTER — Encounter
Admission: RE | Disposition: A | Discharge status: Home / Self Care | Payer: Self-pay | Source: Ambulatory Visit | Attending: Gastroenterology

## 2023-12-29 SURGERY — EGD (ESOPHAGOGASTRODUODENOSCOPY)
Anesthesia: General

## 2023-12-29 NOTE — Anesthesia Preprocedure Evaluation (Deleted)
 Anesthesia Evaluation  Patient identified by MRN, date of birth, ID band Patient awake    Reviewed: Allergy & Precautions, H&P , NPO status , Patient's Chart, lab work & pertinent test results  History of Anesthesia Complications Negative for: history of anesthetic complications  Airway Mallampati: II  TM Distance: >3 FB     Dental  (+) Chipped, Dental Advidsory Given   Pulmonary shortness of breath and with exertion, neg sleep apnea, COPD, neg recent URI, Patient abstained from smoking., former smoker   breath sounds clear to auscultation       Cardiovascular (-) hypertension(-) angina (-) Past MI and (-) Cardiac Stents (-) dysrhythmias  Rhythm:regular Rate:Normal     Neuro/Psych  PSYCHIATRIC DISORDERS  Depression    negative neurological ROS     GI/Hepatic hiatal hernia,GERD  ,,(+)     substance abuse (drinks two fifths per week)  alcohol use  Endo/Other  negative endocrine ROS    Renal/GU negative Renal ROS  negative genitourinary   Musculoskeletal   Abdominal   Peds  Hematology  (+) Blood dyscrasia, anemia   Anesthesia Other Findings Past Medical History: No date: COPD (chronic obstructive pulmonary disease) (HCC)  Past Surgical History: No date: ANKLE SURGERY  BMI    Body Mass Index: 24.21 kg/m      Reproductive/Obstetrics negative OB ROS                              Anesthesia Physical Anesthesia Plan  ASA: 3  Anesthesia Plan: General   Post-op Pain Management:    Induction: Intravenous  PONV Risk Score and Plan: Propofol  infusion, TIVA and Treatment may vary due to age or medical condition  Airway Management Planned: Nasal Cannula and Natural Airway  Additional Equipment:   Intra-op Plan:   Post-operative Plan:   Informed Consent:      Dental Advisory Given  Plan Discussed with: Anesthesiologist, CRNA and Surgeon  Anesthesia Plan Comments:           Anesthesia Quick Evaluation

## 2024-01-02 NOTE — Progress Notes (Signed)
 Synopsis: Referred in by Leavy Mole, PA-C   Subjective:   PATIENT ID: Rodney Howe GENDER: male DOB: 04/23/1951, MRN: [id number]  Chief Complaint  Patient presents with   Consult    Occasional SOB. Wheezing. Cough with green/yellow cough. Has been diagnosed with COPD by his PCP.    HPI Rodney Howe is a pleasant 73 year old male patient with a past medical history of HLD, Depression/Anxoety, neuropathic pain, BPH clinical diagnosis of COPD presenting today to the pulmonary clinic to establish care.   He was diagnosed with COPD 4 to 5 years ago and has been on symbicort  and spiriva . He uses his rescue inhaler on a daily basis. Has an intermittent cough w/ clear phegm and describes mild dyspnea going up the stairs.   LDCT 11/2023 - Mild centrilobular emphysema with diffuse bronchial wall thickening. New RLL spiculate 6.8 mm nodule in the posteromedial basilar right lower lobe.   He denies any family history of lung diseases.   Ex smoker, quitr in 2022 has 100 PPY.     Family History  Problem Relation Age of Onset   Heart attack Mother    Prostate cancer Father      Social History   Socioeconomic History   Marital status: Single    Spouse name: Not on file   Number of children: Not on file   Years of education: Not on file   Highest education level: Not on file  Occupational History   Not on file  Tobacco Use   Smoking status: Former    Current packs/day: 0.00    Average packs/day: 2.0 packs/day for 49.0 years (98.0 ttl pk-yrs)    Types: Cigarettes    Start date: 07/12/1971    Quit date: 07/11/2020    Years since quitting: 3.5   Smokeless tobacco: Never  Substance and Sexual Activity   Alcohol use: Yes    Alcohol/week: 36.0 standard drinks of alcohol    Types: 36 Cans of beer per week   Drug use: Not Currently   Sexual activity: Not Currently  Other Topics Concern   Not on file  Social History Narrative   Not on file   Social Drivers of Health    Financial Resource Strain: Low Risk  (10/07/2023)   Overall Financial Resource Strain (CARDIA)    Difficulty of Paying Living Expenses: Not hard at all  Food Insecurity: No Food Insecurity (10/15/2023)   Hunger Vital Sign    Worried About Running Out of Food in the Last Year: Never true    Ran Out of Food in the Last Year: Never true  Transportation Needs: No Transportation Needs (10/15/2023)   PRAPARE - Administrator, Civil Service (Medical): No    Lack of Transportation (Non-Medical): No  Physical Activity: Inactive (10/07/2023)   Exercise Vital Sign    Days of Exercise per Week: 0 days    Minutes of Exercise per Session: 0 min  Stress: No Stress Concern Present (10/07/2023)   [employer] of Occupational Health - Occupational Stress Questionnaire    Feeling of Stress : Not at all  Social Connections: Socially Isolated (10/07/2023)   Social Connection and Isolation Panel    Frequency of Communication with Friends and Family: Never    Frequency of Social Gatherings with Friends and Family: Once a week    Attends Religious Services: Never    Database administrator or Organizations: No    Attends Banker Meetings: Never  Marital Status: Never married  Intimate Partner Violence: Not At Risk (10/15/2023)   Humiliation, Afraid, Rape, and Kick questionnaire    Fear of Current or Ex-Partner: No    Emotionally Abused: No    Physically Abused: No    Sexually Abused: No        Objective:   Vitals:   12/24/23 1525  BP: 110/60  Pulse: 90  Temp: (!) 97.3 F (36.3 C)  SpO2: 94%  Weight: 165 lb 6.4 oz (75 kg)  Height: 5' 5 (1.651 m)   94% on RA BMI Readings from Last 3 Encounters:  02/10/24 27.12 kg/m  02/04/24 27.72 kg/m  01/26/24 27.66 kg/m   Wt Readings from Last 3 Encounters:  02/10/24 163 lb (73.9 kg)  02/04/24 166 lb 9.6 oz (75.6 kg)  01/26/24 166 lb 3.2 oz (75.4 kg)    Physical Exam GEN: NAD HEENT: Supple Neck, Reactive Pupils, EOMI   CVS: Normal S1, Normal S2, RRR, No murmurs or ES appreciated  Lungs: Poor air movement bilaterally.  Abdomen: Soft, non tender, non distended, + BS  Extremities: Warm and well perfused, No edema   Labs and imaging were reviewed.   Ancillary Information   CBC    Component Value Date/Time   WBC 7.3 01/23/2024 0814   RBC 4.77 01/23/2024 0814   HGB 13.6 01/23/2024 0814   HCT 40.9 01/23/2024 0814   PLT 354 01/23/2024 0814   MCV 85.7 01/23/2024 0814   MCH 28.5 01/23/2024 0814   MCHC 33.3 01/23/2024 0814   RDW 18.4 (H) 01/23/2024 0814   LYMPHSABS 3.1 01/23/2024 0814   MONOABS 1.0 01/23/2024 0814   EOSABS 0.2 01/23/2024 0814   BASOSABS 0.0 01/23/2024 0814        No data to display           Assessment & Plan:  Rodney Howe is a pleasant 73 year old male patient with a past medical history of HLD, Depression/Anxoety, neuropathic pain, BPH clinical diagnosis of COPD presenting today to the pulmonary clinic to establish care.   #COPD unclear stage as no PFTs on file   []  PFTs  []  Switch inhaler to Trelegy Elipta 100 1 puff daily. Advised mouth rinsing after each use.  []  Albuterol  on an as needed basis.  #Tobacco use disorder  100PPY. Quit in 2022. Enrolled in LDCT   #RLL Lung nodule measuring 6.17mm.  Has a repeat LDCT on 02/19/2024   Return in about 6 months (around 06/25/2024).  I personally spent a total of 60 minutes in the care of the patient today including preparing to see the patient, getting/reviewing separately obtained history, performing a medically appropriate exam/evaluation, counseling and educating, placing orders, documenting clinical information in the EHR, and independently interpreting results.   Darrin Barn, MD [facility] 02/14/2024 12:29 PM

## 2024-01-09 ENCOUNTER — Other Ambulatory Visit: Payer: Self-pay | Admitting: *Deleted

## 2024-01-09 DIAGNOSIS — D500 Iron deficiency anemia secondary to blood loss (chronic): Secondary | ICD-10-CM

## 2024-01-12 ENCOUNTER — Inpatient Hospital Stay: Attending: Oncology

## 2024-01-12 DIAGNOSIS — Z87891 Personal history of nicotine dependence: Secondary | ICD-10-CM | POA: Insufficient documentation

## 2024-01-12 DIAGNOSIS — D509 Iron deficiency anemia, unspecified: Secondary | ICD-10-CM | POA: Insufficient documentation

## 2024-01-12 DIAGNOSIS — K259 Gastric ulcer, unspecified as acute or chronic, without hemorrhage or perforation: Secondary | ICD-10-CM | POA: Insufficient documentation

## 2024-01-14 ENCOUNTER — Inpatient Hospital Stay

## 2024-01-14 ENCOUNTER — Inpatient Hospital Stay: Admitting: Oncology

## 2024-01-23 ENCOUNTER — Inpatient Hospital Stay

## 2024-01-23 DIAGNOSIS — D509 Iron deficiency anemia, unspecified: Secondary | ICD-10-CM | POA: Diagnosis present

## 2024-01-23 DIAGNOSIS — Z87891 Personal history of nicotine dependence: Secondary | ICD-10-CM | POA: Diagnosis not present

## 2024-01-23 DIAGNOSIS — D500 Iron deficiency anemia secondary to blood loss (chronic): Secondary | ICD-10-CM

## 2024-01-23 DIAGNOSIS — K259 Gastric ulcer, unspecified as acute or chronic, without hemorrhage or perforation: Secondary | ICD-10-CM | POA: Diagnosis not present

## 2024-01-23 LAB — IRON AND TIBC
Iron: 68 ug/dL (ref 45–182)
Saturation Ratios: 14 % — ABNORMAL LOW (ref 17.9–39.5)
TIBC: 475 ug/dL — ABNORMAL HIGH (ref 250–450)
UIBC: 407 ug/dL

## 2024-01-23 LAB — CBC WITH DIFFERENTIAL/PLATELET
Abs Immature Granulocytes: 0.07 K/uL (ref 0.00–0.07)
Basophils Absolute: 0 K/uL (ref 0.0–0.1)
Basophils Relative: 1 %
Eosinophils Absolute: 0.2 K/uL (ref 0.0–0.5)
Eosinophils Relative: 2 %
HCT: 40.9 % (ref 39.0–52.0)
Hemoglobin: 13.6 g/dL (ref 13.0–17.0)
Immature Granulocytes: 1 %
Lymphocytes Relative: 42 %
Lymphs Abs: 3.1 K/uL (ref 0.7–4.0)
MCH: 28.5 pg (ref 26.0–34.0)
MCHC: 33.3 g/dL (ref 30.0–36.0)
MCV: 85.7 fL (ref 80.0–100.0)
Monocytes Absolute: 1 K/uL (ref 0.1–1.0)
Monocytes Relative: 14 %
Neutro Abs: 2.9 K/uL (ref 1.7–7.7)
Neutrophils Relative %: 40 %
Platelets: 354 K/uL (ref 150–400)
RBC: 4.77 MIL/uL (ref 4.22–5.81)
RDW: 18.4 % — ABNORMAL HIGH (ref 11.5–15.5)
WBC: 7.3 K/uL (ref 4.0–10.5)
nRBC: 0 % (ref 0.0–0.2)

## 2024-01-23 LAB — FERRITIN: Ferritin: 28 ng/mL (ref 24–336)

## 2024-01-26 ENCOUNTER — Inpatient Hospital Stay (HOSPITAL_BASED_OUTPATIENT_CLINIC_OR_DEPARTMENT_OTHER): Admitting: Oncology

## 2024-01-26 ENCOUNTER — Encounter: Payer: Self-pay | Admitting: Oncology

## 2024-01-26 ENCOUNTER — Inpatient Hospital Stay

## 2024-01-26 VITALS — BP 126/83 | HR 79 | Temp 97.8°F | Resp 18 | Ht 65.0 in | Wt 166.2 lb

## 2024-01-26 DIAGNOSIS — D500 Iron deficiency anemia secondary to blood loss (chronic): Secondary | ICD-10-CM | POA: Diagnosis not present

## 2024-01-26 DIAGNOSIS — D509 Iron deficiency anemia, unspecified: Secondary | ICD-10-CM | POA: Diagnosis not present

## 2024-01-26 NOTE — Progress Notes (Unsigned)
 Patient is doing ok, he is having a headache.

## 2024-01-26 NOTE — Progress Notes (Unsigned)
 [facility]  Telephone:(336) [phone] Fax:(336) [phone]  ID: Arkel Cartwright OB: 29-Mar-1951  MR#: [id number]  RDW#:[id number]  Patient Care Team: Leavy Mole, PA-C as PCP - General (Family Medicine) Jacobo Evalene PARAS, MD as Consulting Physician (Oncology)  CHIEF COMPLAINT: Iron  deficiency anemia.  INTERVAL HISTORY: Patient returns to clinic today for repeat laboratory work, further evaluation, consideration of additional IV iron .  He currently feels well and is asymptomatic.  He does not complain of any weakness or fatigue.  He has no neurologic complaints.  He denies any recent fevers or illnesses.  He has a good appetite and denies weight loss.  He has no chest pain, shortness of breath, cough, or hemoptysis.  He denies any nausea, vomiting, constipation, or diarrhea.  He has no further melena and denies any hematochezia.  He has no urinary complaints.  Patient offers no further specific complaints today.  REVIEW OF SYSTEMS:   Review of Systems  Constitutional: Negative.  Negative for fever, malaise/fatigue and weight loss.  Respiratory: Negative.  Negative for cough, hemoptysis and shortness of breath.   Cardiovascular: Negative.  Negative for chest pain and leg swelling.  Gastrointestinal: Negative.  Negative for abdominal pain, blood in stool and melena.  Genitourinary:  Negative for dysuria.  Musculoskeletal: Negative.  Negative for back pain.  Skin: Negative.  Negative for rash.  Neurological: Negative.  Negative for dizziness, focal weakness, weakness and headaches.  Psychiatric/Behavioral: Negative.  The patient is not nervous/anxious.     As per HPI. Otherwise, a complete review of systems is negative.  PAST MEDICAL HISTORY: Past Medical History:  Diagnosis Date   BPH (benign prostatic hyperplasia)    COPD (chronic obstructive pulmonary disease) (HCC)    COPD (chronic obstructive pulmonary disease) (HCC)    GERD (gastroesophageal reflux disease)     HLD (hyperlipidemia)     PAST SURGICAL HISTORY: Past Surgical History:  Procedure Laterality Date   ANKLE SURGERY     COLONOSCOPY WITH PROPOFOL  N/A 09/19/2020   Procedure: COLONOSCOPY WITH PROPOFOL ;  Surgeon: Therisa Bi, MD;  Location: [facility] ENDOSCOPY;  Service: Gastroenterology;  Laterality: N/A;   ESOPHAGOGASTRODUODENOSCOPY N/A 09/02/2023   Procedure: EGD (ESOPHAGOGASTRODUODENOSCOPY);  Surgeon: Jinny Carmine, MD;  Location: [facility] ENDOSCOPY;  Service: Endoscopy;  Laterality: N/A;    FAMILY HISTORY: Family History  Problem Relation Age of Onset   Heart attack Mother    Prostate cancer Father     ADVANCED DIRECTIVES (Y/N):  N  HEALTH MAINTENANCE: Social History   Tobacco Use   Smoking status: Former    Current packs/day: 0.00    Average packs/day: 2.0 packs/day for 49.0 years (98.0 ttl pk-yrs)    Types: Cigarettes    Start date: 07/12/1971    Quit date: 07/11/2020    Years since quitting: 3.5   Smokeless tobacco: Never  Substance Use Topics   Alcohol use: Yes    Alcohol/week: 36.0 standard drinks of alcohol    Types: 36 Cans of beer per week   Drug use: Not Currently     Colonoscopy:  PAP:  Bone density:  Lipid panel:  No Known Allergies  Current Outpatient Medications  Medication Sig Dispense Refill   acetaminophen  (TYLENOL ) 325 MG tablet Take 2 tablets (650 mg total) by mouth every 6 (six) hours as needed for mild pain (pain score 1-3) or fever. 30 tablet 0   albuterol  (VENTOLIN  HFA) 108 (90 Base) MCG/ACT inhaler Inhale 2 puffs into the lungs every 6 (six) hours as needed for wheezing or  shortness of breath. 1 Inhaler 1   amitriptyline  (ELAVIL ) 25 MG tablet Take 25 mg by mouth at bedtime.     atorvastatin  (LIPITOR) 40 MG tablet Take 1 tablet (40 mg total) by mouth every evening. 90 tablet 3   cyanocobalamin (VITAMIN B12) 1000 MCG tablet Take 1 tablet (1,000 mcg total) by mouth daily. 90 tablet 1   fenofibrate 160 MG tablet Take 160 mg by mouth daily.      finasteride  (PROSCAR ) 5 MG tablet Take 1 tablet (5 mg total) by mouth daily. 30 tablet 11   fluorouracil  (EFUDEX ) 5 % cream Apply twice daily to nose, right hand until redness and irritation occurs, then stop 40 g 0   Fluticasone-Umeclidin-Vilant (TRELEGY ELLIPTA ) 100-62.5-25 MCG/ACT AEPB Inhale 1 puff into the lungs daily. 3 each 3   Fluticasone-Umeclidin-Vilant (TRELEGY ELLIPTA ) 100-62.5-25 MCG/ACT AEPB Inhale 1 puff into the lungs daily. 14 each 0   folic acid  (FOLVITE ) 1 MG tablet Take 1 tablet (1 mg total) by mouth daily. 30 tablet 3   pantoprazole  (PROTONIX ) 40 MG tablet Take 1 tablet (40 mg total) by mouth 2 (two) times daily. 60 tablet 3   pregabalin  (LYRICA ) 25 MG capsule Take 1 capsule (25 mg total) by mouth 2 (two) times daily. 180 capsule 0   senna-docusate (SENOKOT-S) 8.6-50 MG tablet Take 1 tablet by mouth 2 (two) times daily. 30 tablet 0   SPIRIVA  HANDIHALER 18 MCG inhalation capsule Place 1 capsule (18 mcg total) into inhaler and inhale daily. 30 capsule 2   tamsulosin  (FLOMAX ) 0.4 MG CAPS capsule Take 1 capsule (0.4 mg total) by mouth daily. 30 capsule 11   thiamine  (VITAMIN B-1) 100 MG tablet Take 1 tablet (100 mg total) by mouth daily. 30 tablet 3   No current facility-administered medications for this visit.    OBJECTIVE: Vitals:   01/26/24 1027  BP: 126/83  Pulse: 79  Resp: 18  Temp: 97.8 F (36.6 C)  SpO2: 98%     Body mass index is 27.66 kg/m.    ECOG FS:0 - Asymptomatic  General: Well-developed, well-nourished, no acute distress. Eyes: Pink conjunctiva, anicteric sclera. HEENT: Normocephalic, moist mucous membranes. Lungs: No audible wheezing or coughing. Heart: Regular rate and rhythm. Abdomen: Soft, nontender, no obvious distention. Musculoskeletal: No edema, cyanosis, or clubbing. Neuro: Alert, answering all questions appropriately. Cranial nerves grossly intact. Skin: No rashes or petechiae noted. Psych: Normal affect.  LAB RESULTS:  Lab Results   Component Value Date   NA 137 11/10/2023   K 5.0 11/10/2023   CL 102 11/10/2023   CO2 27 11/10/2023   GLUCOSE 79 11/10/2023   BUN 11 11/10/2023   CREATININE 1.11 11/10/2023   CALCIUM  9.5 11/10/2023   PROT 6.7 11/10/2023   ALBUMIN 3.4 (L) 10/13/2023   AST 20 11/10/2023   ALT 17 11/10/2023   ALKPHOS 61 10/13/2023   BILITOT 0.4 11/10/2023   GFRNONAA >60 10/13/2023   GFRAA >60 09/18/2019    Lab Results  Component Value Date   WBC 7.3 01/23/2024   NEUTROABS 2.9 01/23/2024   HGB 13.6 01/23/2024   HCT 40.9 01/23/2024   MCV 85.7 01/23/2024   PLT 354 01/23/2024   Lab Results  Component Value Date   IRON  68 01/23/2024   TIBC 475 (H) 01/23/2024   IRONPCTSAT 14 (L) 01/23/2024   Lab Results  Component Value Date   FERRITIN 28 01/23/2024     STUDIES: No results found.  ASSESSMENT: Iron  deficiency anemia.  PLAN:  Iron  deficiency anemia: Possibly related to GI blood loss.  Patient underwent EGD on September 01, 2023.  He was noted to have 2 nonbleeding gastric ulcers, but no other significant pathology.  His iron  saturation level is slightly decreased, but the remainder of his iron  panel and hemoglobin are within normal limits.  He does not require additional IV Venofer  today.  Patient last received treatment on Oct 31, 2023.  No intervention is needed.  Return to clinic in 4 months with repeat laboratory, further evaluation, and continuation of treatment if needed.    I spent a total of 20 minutes reviewing chart data, face-to-face evaluation with the patient, counseling and coordination of care as detailed above.    Patient expressed understanding and was in agreement with this plan. He also understands that He can call clinic at any time with any questions, concerns, or complaints.    Evalene JINNY Reusing, MD   01/26/2024 10:59 AM

## 2024-01-27 ENCOUNTER — Encounter: Payer: Self-pay | Admitting: Oncology

## 2024-02-03 NOTE — Progress Notes (Unsigned)
 02/04/2024 9:35 PM   Rodney Howe 12-06-1950 [id number]  Referring provider: Leavy Mole, PA-C [address] [place],  [place] [address]  Urological history: 1. BPH with LU TS - PSA (07/2023) 1.1 -cysto -enlarged prostate bilobar coaptation with a relatively short prostatic length 06/2020 -TRUS 25 cc 06/2020 -Tamsulosin  0.4 mg daily and finish astride 5 mg daily  2. Peyronie's disease -left mid shaft plaque palpated -one cycle of Xiaflex     3. ED -contributing factors of age, alcohol abuse, smoking, HLD, COPD and neuropathy - Testosterone  level (07/2023) 399 -managed with sildenafil  20 mg, on-demand-dosing  No chief complaint on file.  HPI: Rodney Howe is a 73 y.o. male who presents today for follow up.    Previous records reviewed.   Serum creatinine 1.11 with a EGFR of 71 in June.  Total cholesterol 116 in May.  UA negative in March.  Contrast CT (08/2023) no hydronephrosis, no renal calculi and no solid lesions were appreciated.  A partially imaged left hydrocele. ***     PMH: Past Medical History:  Diagnosis Date   BPH (benign prostatic hyperplasia)    COPD (chronic obstructive pulmonary disease) (HCC)    COPD (chronic obstructive pulmonary disease) (HCC)    GERD (gastroesophageal reflux disease)    HLD (hyperlipidemia)     Surgical History: Past Surgical History:  Procedure Laterality Date   ANKLE SURGERY     COLONOSCOPY WITH PROPOFOL  N/A 09/19/2020   Procedure: COLONOSCOPY WITH PROPOFOL ;  Surgeon: Therisa Bi, MD;  [facility] ENDOSCOPY;  Service: Gastroenterology;  Laterality: N/A;   ESOPHAGOGASTRODUODENOSCOPY N/A 09/02/2023   Procedure: EGD (ESOPHAGOGASTRODUODENOSCOPY);  Surgeon: Jinny Carmine, MD;  Location: [facility] ENDOSCOPY;  Service: Endoscopy;  Laterality: N/A;    Home Medications:  Allergies as of 02/04/2024   No Known Allergies      Medication List        Accurate as of February 03, 2024  9:35 PM. If you  have any questions, ask your nurse or doctor.          acetaminophen  325 MG tablet Commonly known as: TYLENOL  Take 2 tablets (650 mg total) by mouth every 6 (six) hours as needed for mild pain (pain score 1-3) or fever.   albuterol  108 (90 Base) MCG/ACT inhaler Commonly known as: Ventolin  HFA Inhale 2 puffs into the lungs every 6 (six) hours as needed for wheezing or shortness of breath.   amitriptyline  25 MG tablet Commonly known as: ELAVIL  Take 25 mg by mouth at bedtime.   atorvastatin  40 MG tablet Commonly known as: LIPITOR Take 1 tablet (40 mg total) by mouth every evening.   cyanocobalamin 1000 MCG tablet Commonly known as: VITAMIN B12 Take 1 tablet (1,000 mcg total) by mouth daily.   fenofibrate 160 MG tablet Take 160 mg by mouth daily.   finasteride  5 MG tablet Commonly known as: PROSCAR  Take 1 tablet (5 mg total) by mouth daily.   fluorouracil  5 % cream Commonly known as: EFUDEX  Apply twice daily to nose, right hand until redness and irritation occurs, then stop   folic acid  1 MG tablet Commonly known as: FOLVITE  Take 1 tablet (1 mg total) by mouth daily.   pantoprazole  40 MG tablet Commonly known as: PROTONIX  Take 1 tablet (40 mg total) by mouth 2 (two) times daily.   pregabalin  25 MG capsule Commonly known as: LYRICA  Take 1 capsule (25 mg total) by mouth 2 (two) times daily.   senna-docusate 8.6-50 MG tablet Commonly known  as: Senokot-S Take 1 tablet by mouth 2 (two) times daily.   Spiriva  HandiHaler 18 MCG inhalation capsule Generic drug: tiotropium Place 1 capsule (18 mcg total) into inhaler and inhale daily.   tamsulosin  0.4 MG Caps capsule Commonly known as: FLOMAX  Take 1 capsule (0.4 mg total) by mouth daily.   thiamine  100 MG tablet Commonly known as: Vitamin B-1 Take 1 tablet (100 mg total) by mouth daily.   Trelegy Ellipta  100-62.5-25 MCG/ACT Aepb Generic drug: Fluticasone-Umeclidin-Vilant Inhale 1 puff into the lungs daily.    Trelegy Ellipta  100-62.5-25 MCG/ACT Aepb Generic drug: Fluticasone-Umeclidin-Vilant Inhale 1 puff into the lungs daily.        Allergies: No Known Allergies  Family History: Family History  Problem Relation Age of Onset   Heart attack Mother    Prostate cancer Father     Social History:  reports that he quit smoking about 3 years ago. His smoking use included cigarettes. He started smoking about 52 years ago. He has a 98 pack-year smoking history. He has never used smokeless tobacco. He reports current alcohol use of about 36.0 standard drinks of alcohol per week. He reports that he does not currently use drugs.  ROS: Pertinent ROS in HPI  Physical Exam: There were no vitals taken for this visit.  Constitutional:  Well nourished. Alert and oriented, No acute distress. HEENT: [place] AT, moist mucus membranes.  Trachea midline Cardiovascular: No clubbing, cyanosis, or edema. Respiratory: Normal respiratory effort, no increased work of breathing. Neurologic: Grossly intact, no focal deficits, moving all 4 extremities. Psychiatric: Normal mood and affect.   Laboratory Data: See EPIC and HPI  I have reviewed the labs.   Pertinent Imaging: CLINICAL DATA:  Midline chest and abdominal pain radiating to back, multiple syncopal episodes   EXAM: CT ANGIOGRAPHY CHEST, ABDOMEN AND PELVIS   TECHNIQUE: Non-contrast CT of the chest was initially obtained.   Multidetector CT imaging through the chest, abdomen and pelvis was performed using the standard protocol during bolus administration of intravenous contrast. Multiplanar reconstructed images and MIPs were obtained and reviewed to evaluate the vascular anatomy.   RADIATION DOSE REDUCTION: This exam was performed according to the departmental dose-optimization program which includes automated exposure control, adjustment of the mA and/or kV according to patient size and/or use of iterative reconstruction technique.   CONTRAST:   OMNIPAQUE  IOHEXOL  350 MG/ML SOLN   COMPARISON:  CT chest, 08/22/2022   FINDINGS: CTA CHEST FINDINGS   VASCULAR   Aorta: Satisfactory opacification of the aorta. Normal contour and caliber of the thoracic aorta. No evidence of aneurysm, dissection, or other acute aortic pathology. Mild aortic atherosclerosis.   Cardiovascular: No evidence of pulmonary embolism on limited non-tailored examination. Normal heart size. Three-vessel coronary artery calcifications. No pericardial effusion.   Review of the MIP images confirms the above findings.   NON VASCULAR   Mediastinum/Nodes: No enlarged mediastinal, hilar, or axillary lymph nodes. Moderate hiatal hernia with intrathoracic position of the gastric fundus. Thyroid  gland, trachea, and esophagus demonstrate no significant findings.   Lungs/Pleura: Mild pulmonary fibrosis in a pattern with slight apical to basal gradient, featuring irregular peripheral interstitial opacity, without evidence significant subpleural bronchiolectasis or honeycombing. No pleural effusion or pneumothorax.   Musculoskeletal: No chest wall abnormality. No acute osseous findings.   Review of the MIP images confirms the above findings.   CTA ABDOMEN AND PELVIS FINDINGS   VASCULAR   Normal contour and caliber of the abdominal aorta. No evidence of aneurysm, dissection, or  other acute aortic pathology. Standard branching pattern of the abdominal aorta with solitary bilateral renal arteries. Mild aortic atherosclerosis.   Review of the MIP images confirms the above findings.   NON-VASCULAR   Hepatobiliary: No solid liver abnormality is seen. Hepatic steatosis. No gallstones, gallbladder wall thickening, or biliary dilatation.   Pancreas: Unremarkable. No pancreatic ductal dilatation or surrounding inflammatory changes.   Spleen: Normal in size without significant abnormality.   Adrenals/Urinary Tract: Adrenal glands are unremarkable.  Kidneys are normal, without renal calculi, solid lesion, or hydronephrosis. Bladder is unremarkable. Pancolonic diverticulosis.   Stomach/Bowel: Stomach is within normal limits. Appendix appears normal. No evidence of bowel wall thickening, distention, or inflammatory changes.   Lymphatic: No enlarged abdominal or pelvic lymph nodes.   Reproductive: No mass or other significant abnormality. Partially imaged left hydrocele.   Other: No abdominal wall hernia or abnormality. No ascites.   Musculoskeletal: No acute osseous findings.   IMPRESSION: 1. Normal contour and caliber of the thoracic and abdominal aorta. No evidence of aneurysm, dissection, or other acute aortic pathology. Mild aortic atherosclerosis. 2. Mild pulmonary fibrosis in a pattern with slight apical to basal gradient, featuring irregular peripheral interstitial opacity, without evidence significant subpleural bronchiolectasis or honeycombing. Findings are indeterminate for UIP by ATS pulmonary fibrosis criteria. 3. Coronary artery disease. 4. Moderate hiatal hernia with intrathoracic position of the gastric fundus. 5. Hepatic steatosis. 6. Descending and sigmoid diverticulosis without evidence of acute diverticulitis. 7. Partially imaged left hydrocele.   Aortic Atherosclerosis (ICD10-I70.0).     Electronically Signed   By: Marolyn JONETTA Jaksch M.D.   On: 09/01/2023 17:04  *** I have independently reviewed the films.  See HPI.    Assessment & Plan:    1. BPH with LUTS -PSA normal  -UA benign  -PVR < 300 cc  -continue conservative management, avoiding bladder irritants and timed voiding's -Continue tamsulosin  0.4 mg daily and finasteride  5 mg daily  2. Erectile dysfunction -Start sildenafil  100 mg on demand dosing -Testosterone  level normal  3. Peyronie's diease -had one cycle of Xiaflex   -Curvature resolved   No follow-ups on file.  These notes generated with voice recognition software. I  apologize for typographical errors.  CLOTILDA HELON RIGGERS  [facility] [address]  [address] [place], [place] [address] [phone]

## 2024-02-04 ENCOUNTER — Encounter: Payer: Self-pay | Admitting: Urology

## 2024-02-04 ENCOUNTER — Ambulatory Visit (INDEPENDENT_AMBULATORY_CARE_PROVIDER_SITE_OTHER): Admitting: Urology

## 2024-02-04 VITALS — BP 114/73 | HR 71 | Ht 65.0 in | Wt 166.6 lb

## 2024-02-04 DIAGNOSIS — N138 Other obstructive and reflux uropathy: Secondary | ICD-10-CM

## 2024-02-04 DIAGNOSIS — N486 Induration penis plastica: Secondary | ICD-10-CM

## 2024-02-04 DIAGNOSIS — N529 Male erectile dysfunction, unspecified: Secondary | ICD-10-CM

## 2024-02-04 DIAGNOSIS — N401 Enlarged prostate with lower urinary tract symptoms: Secondary | ICD-10-CM | POA: Diagnosis not present

## 2024-02-04 LAB — BLADDER SCAN AMB NON-IMAGING

## 2024-02-04 MED ORDER — TAMSULOSIN HCL 0.4 MG PO CAPS: 0.4000 mg | ORAL_CAPSULE | Freq: Every day | ORAL | 11 refills | Status: AC

## 2024-02-04 MED ORDER — FINASTERIDE 5 MG PO TABS
5.0000 mg | ORAL_TABLET | Freq: Every day | ORAL | 11 refills | Status: AC
Start: 2024-02-04 — End: ?

## 2024-02-10 ENCOUNTER — Encounter: Payer: Self-pay | Admitting: Family Medicine

## 2024-02-10 ENCOUNTER — Ambulatory Visit (INDEPENDENT_AMBULATORY_CARE_PROVIDER_SITE_OTHER): Admitting: Family Medicine

## 2024-02-10 VITALS — BP 130/76 | HR 81 | Resp 16 | Ht 65.0 in | Wt 163.0 lb

## 2024-02-10 DIAGNOSIS — E785 Hyperlipidemia, unspecified: Secondary | ICD-10-CM | POA: Diagnosis not present

## 2024-02-10 DIAGNOSIS — J449 Chronic obstructive pulmonary disease, unspecified: Secondary | ICD-10-CM | POA: Diagnosis not present

## 2024-02-10 DIAGNOSIS — M25511 Pain in right shoulder: Secondary | ICD-10-CM

## 2024-02-10 DIAGNOSIS — G629 Polyneuropathy, unspecified: Secondary | ICD-10-CM | POA: Diagnosis not present

## 2024-02-10 DIAGNOSIS — E519 Thiamine deficiency, unspecified: Secondary | ICD-10-CM

## 2024-02-10 DIAGNOSIS — K2100 Gastro-esophageal reflux disease with esophagitis, without bleeding: Secondary | ICD-10-CM

## 2024-02-10 DIAGNOSIS — Z5181 Encounter for therapeutic drug level monitoring: Secondary | ICD-10-CM

## 2024-02-10 DIAGNOSIS — F1010 Alcohol abuse, uncomplicated: Secondary | ICD-10-CM

## 2024-02-10 DIAGNOSIS — H02403 Unspecified ptosis of bilateral eyelids: Secondary | ICD-10-CM | POA: Insufficient documentation

## 2024-02-10 DIAGNOSIS — E538 Deficiency of other specified B group vitamins: Secondary | ICD-10-CM

## 2024-02-10 DIAGNOSIS — D500 Iron deficiency anemia secondary to blood loss (chronic): Secondary | ICD-10-CM

## 2024-02-10 DIAGNOSIS — H5340 Unspecified visual field defects: Secondary | ICD-10-CM | POA: Insufficient documentation

## 2024-02-10 DIAGNOSIS — F322 Major depressive disorder, single episode, severe without psychotic features: Secondary | ICD-10-CM

## 2024-02-10 DIAGNOSIS — G4700 Insomnia, unspecified: Secondary | ICD-10-CM

## 2024-02-10 MED ORDER — VITAMIN B-1 100 MG PO TABS: 100.0000 mg | ORAL_TABLET | Freq: Every day | ORAL | 3 refills | Status: DC

## 2024-02-10 MED ORDER — FOLIC ACID 1 MG PO TABS: 1.0000 mg | ORAL_TABLET | Freq: Every day | ORAL | 3 refills | Status: DC

## 2024-02-10 MED ORDER — VITAMIN B-12 1000 MCG PO TABS: 1000.0000 ug | ORAL_TABLET | Freq: Every day | ORAL | 1 refills | Status: DC

## 2024-02-10 NOTE — Patient Instructions (Addendum)
 IF you aren't having any severe pain any more then try to stop or wean off the lyrica  and see if that could be making you tired as a side effect.

## 2024-02-10 NOTE — Progress Notes (Unsigned)
 Name: Rodney Howe   MRN: [id number]    DOB: 1950/10/09   Date:02/10/2024       Progress Note  Chief Complaint  Patient presents with   Medical Management of Chronic Issues   COPD   Gastroesophageal Reflux     Subjective:   Rodney Howe is a 73 y.o. male, presents to clinic for routine follow up on chronic conditions  Going to [facility] for surgery on eyelids  F/up chronic conditions after recently establishing care   GERD/gastric ulcer seeing GI and pending EGD  On PPI  Quit drinking ETOH but then states he's drinking a little bit defined as a fifth lasting a whole week.     Current Outpatient Medications:    acetaminophen  (TYLENOL ) 325 MG tablet, Take 2 tablets (650 mg total) by mouth every 6 (six) hours as needed for mild pain (pain score 1-3) or fever., Disp: 30 tablet, Rfl: 0   amitriptyline  (ELAVIL ) 25 MG tablet, Take 25 mg by mouth at bedtime., Disp: , Rfl:    atorvastatin  (LIPITOR) 40 MG tablet, Take 1 tablet (40 mg total) by mouth every evening., Disp: 90 tablet, Rfl: 3   cyanocobalamin (VITAMIN B12) 1000 MCG tablet, Take 1 tablet (1,000 mcg total) by mouth daily., Disp: 90 tablet, Rfl: 1   fenofibrate 160 MG tablet, Take 160 mg by mouth daily., Disp: , Rfl:    finasteride  (PROSCAR ) 5 MG tablet, Take 1 tablet (5 mg total) by mouth daily., Disp: 30 tablet, Rfl: 11   fluorouracil  (EFUDEX ) 5 % cream, Apply twice daily to nose, right hand until redness and irritation occurs, then stop, Disp: 40 g, Rfl: 0   Fluticasone-Umeclidin-Vilant (TRELEGY ELLIPTA ) 100-62.5-25 MCG/ACT AEPB, Inhale 1 puff into the lungs daily., Disp: 3 each, Rfl: 3   Fluticasone-Umeclidin-Vilant (TRELEGY ELLIPTA ) 100-62.5-25 MCG/ACT AEPB, Inhale 1 puff into the lungs daily., Disp: 14 each, Rfl: 0   folic acid  (FOLVITE ) 1 MG tablet, Take 1 tablet (1 mg total) by mouth daily., Disp: 30 tablet, Rfl: 3   pantoprazole  (PROTONIX ) 40 MG tablet, Take 1 tablet (40 mg total) by mouth 2 (two) times  daily., Disp: 60 tablet, Rfl: 3   pregabalin  (LYRICA ) 25 MG capsule, Take 1 capsule (25 mg total) by mouth 2 (two) times daily., Disp: 180 capsule, Rfl: 0   senna-docusate (SENOKOT-S) 8.6-50 MG tablet, Take 1 tablet by mouth 2 (two) times daily., Disp: 30 tablet, Rfl: 0   tamsulosin  (FLOMAX ) 0.4 MG CAPS capsule, Take 1 capsule (0.4 mg total) by mouth daily., Disp: 30 capsule, Rfl: 11   thiamine  (VITAMIN B-1) 100 MG tablet, Take 1 tablet (100 mg total) by mouth daily., Disp: 30 tablet, Rfl: 3  Patient Active Problem List   Diagnosis Date Noted   Severe major depressive disorder (HCC) 11/10/2023   Insomnia 11/10/2023   Neuropathy 11/10/2023   Iron  deficiency anemia due to chronic blood loss 10/15/2023   Peyronie's disease 10/07/2023   Former smoker 10/07/2023   RBBB 10/07/2023   Upper GI bleed 09/03/2023   Gastric ulcer without hemorrhage or perforation 09/02/2023   GI bleeding 09/01/2023   Acute blood loss anemia 09/01/2023   History of pelvic fracture 09/01/2023   Overweight (BMI 25.0-29.9) 09/01/2023   Alcohol abuse 09/01/2023   COPD (chronic obstructive pulmonary disease) (HCC)    GERD (gastroesophageal reflux disease)    BPH (benign prostatic hyperplasia)    Hyperlipidemia 06/24/2013   FHx: colon cancer 05/19/2013    Past Surgical History:  Procedure Laterality  Date   ANKLE SURGERY     COLONOSCOPY WITH PROPOFOL  N/A 09/19/2020   Procedure: COLONOSCOPY WITH PROPOFOL ;  Surgeon: Therisa Bi, MD;  Location: [facility] ENDOSCOPY;  Service: Gastroenterology;  Laterality: N/A;   ESOPHAGOGASTRODUODENOSCOPY N/A 09/02/2023   Procedure: EGD (ESOPHAGOGASTRODUODENOSCOPY);  Surgeon: Jinny Carmine, MD;  Location: [facility] ENDOSCOPY;  Service: Endoscopy;  Laterality: N/A;    Family History  Problem Relation Age of Onset   Heart attack Mother    Prostate cancer Father     Social History   Tobacco Use   Smoking status: Former    Current packs/day: 0.00    Average packs/day: 2.0 packs/day for 49.0  years (98.0 ttl pk-yrs)    Types: Cigarettes    Start date: 07/12/1971    Quit date: 07/11/2020    Years since quitting: 3.5   Smokeless tobacco: Never  Substance Use Topics   Alcohol use: Yes    Alcohol/week: 36.0 standard drinks of alcohol    Types: 36 Cans of beer per week   Drug use: Not Currently     No Known Allergies  Health Maintenance  Topic Date Due   Medicare Annual Wellness (AWV)  Never done   Influenza Vaccine  01/02/2024   COVID-19 Vaccine (4 - 2025-26 season) 02/25/2024 (Originally 02/02/2024)   Hepatitis C Screening  02/08/2025 (Originally [birth year]09/1970)   Lung Cancer Screening  11/17/2024   Colonoscopy  09/20/2027   DTaP/Tdap/Td (4 - Td or Tdap) 07/13/2030   Pneumococcal Vaccine: 50+ Years  Completed   Zoster Vaccines- Shingrix  Completed   HPV VACCINES  Aged Out   Meningococcal B Vaccine  Aged Out    Chart Review Today: I personally reviewed active problem list, medication list, allergies, family history, social history, health maintenance, notes from last encounter, lab results, imaging with the patient/caregiver today.   Review of Systems  Constitutional: Negative.   HENT: Negative.    Eyes: Negative.   Respiratory: Negative.    Cardiovascular: Negative.   Gastrointestinal: Negative.   Endocrine: Negative.   Genitourinary: Negative.   Musculoskeletal: Negative.   Skin: Negative.   Allergic/Immunologic: Negative.   Neurological: Negative.   Hematological: Negative.   Psychiatric/Behavioral: Negative.    All other systems reviewed and are negative.    Objective:   Vitals:   02/10/24 1120  BP: 130/76  Pulse: 81  Resp: 16  SpO2: 99%  Weight: 163 lb (73.9 kg)  Height: 5' 5 (1.651 m)    Body mass index is 27.12 kg/m.  Physical Exam Vitals and nursing note reviewed.  Constitutional:      General: He is not in acute distress.    Appearance: Normal appearance. He is well-developed. He is not ill-appearing, toxic-appearing or diaphoretic.  HENT:      Head: Normocephalic and atraumatic.     Right Ear: External ear normal.     Left Ear: External ear normal.     Nose: Nose normal.  Eyes:     General: No scleral icterus.       Right eye: No discharge.        Left eye: No discharge.     Conjunctiva/sclera: Conjunctivae normal.  Neck:     Trachea: No tracheal deviation.  Cardiovascular:     Rate and Rhythm: Normal rate and regular rhythm.  Pulmonary:     Effort: Pulmonary effort is normal. No respiratory distress.     Breath sounds: No stridor. Rhonchi present. No wheezing or rales.  Skin:    General: Skin is  warm and dry.     Findings: No rash.  Neurological:     Mental Status: He is alert.     Motor: No abnormal muscle tone.     Coordination: Coordination normal.     Gait: Gait normal.  Psychiatric:        Mood and Affect: Mood normal.        Behavior: Behavior normal.       Results for orders placed or performed in visit on 02/04/24  Bladder Scan (Post Void Residual) in office   Collection Time: 02/04/24 10:32 AM  Result Value Ref Range   Scan Result 76ml       Assessment & Plan:     ICD-10-CM   1. Chronic obstructive pulmonary disease, unspecified COPD type (HCC)  J44.9    he has est with pulmonology, changed inhalers, so far he cannot tell if his sx are improving    2. Gastroesophageal reflux disease with esophagitis, unspecified whether hemorrhage  K21.00    pending EGD with GI, encouraged him to continue PPI or meds from GI and avoid nsaids and ETOH    3. Hyperlipidemia, unspecified hyperlipidemia type  E78.5    on statin    4. Neuropathy  G62.9    still unclear hx without receiving records, amytriptiline from past primary care, mulitple deficiencies    5. Insomnia, unspecified type  G47.00    poor sleep, trying medications    6. Encounter for medication monitoring  Z51.81     7. Severe major depressive disorder (HCC)  F32.2    Phq-9    8. Alcohol abuse  F10.10 folic acid  (FOLVITE ) 1 MG tablet     cyanocobalamin (VITAMIN B12) 1000 MCG tablet    thiamine  (VITAMIN B-1) 100 MG tablet   again strongly encouraged to cut out all ETOH completely, AA resources offered    9. B12 deficiency  E53.8 cyanocobalamin (VITAMIN B12) 1000 MCG tablet   not supplementing regularly and he has not stopped ETOH    10. Thiamin deficiency  E51.9 thiamine  (VITAMIN B-1) 100 MG tablet   not supplementing regularly and he has not stopped ETOH    11. Acute pain of right shoulder  M25.511    no better after ortho consult - given info on his ortho specialists and encouraged to call to get f/up appt - Dr. Onesimo    12. Iron  deficiency anemia due to chronic blood loss  D50.0    improving IDA per hematology        Return in about 6 months (around 08/09/2024) for Routine follow-up.   Michelene Cower, PA-C 02/10/24 11:36 AM

## 2024-02-12 ENCOUNTER — Encounter: Payer: Self-pay | Admitting: Family Medicine

## 2024-02-19 ENCOUNTER — Ambulatory Visit
Admission: RE | Admit: 2024-02-19 | Discharge: 2024-02-19 | Disposition: A | Discharge status: Home / Self Care | Source: Ambulatory Visit | Attending: Acute Care | Admitting: Acute Care

## 2024-02-19 DIAGNOSIS — Z87891 Personal history of nicotine dependence: Secondary | ICD-10-CM | POA: Diagnosis present

## 2024-02-19 DIAGNOSIS — R911 Solitary pulmonary nodule: Secondary | ICD-10-CM | POA: Diagnosis present

## 2024-02-20 ENCOUNTER — Ambulatory Visit: Admitting: Anesthesiology

## 2024-02-20 ENCOUNTER — Encounter
Admission: RE | Disposition: A | Discharge status: Home / Self Care | Payer: Self-pay | Source: Home / Self Care | Attending: Gastroenterology

## 2024-02-20 ENCOUNTER — Other Ambulatory Visit: Payer: Self-pay

## 2024-02-20 ENCOUNTER — Ambulatory Visit
Admission: RE | Admit: 2024-02-20 | Discharge: 2024-02-20 | Disposition: A | Discharge status: Home / Self Care | Attending: Gastroenterology | Admitting: Gastroenterology

## 2024-02-20 DIAGNOSIS — J449 Chronic obstructive pulmonary disease, unspecified: Secondary | ICD-10-CM | POA: Diagnosis not present

## 2024-02-20 DIAGNOSIS — K219 Gastro-esophageal reflux disease without esophagitis: Secondary | ICD-10-CM | POA: Insufficient documentation

## 2024-02-20 DIAGNOSIS — K279 Peptic ulcer, site unspecified, unspecified as acute or chronic, without hemorrhage or perforation: Secondary | ICD-10-CM | POA: Insufficient documentation

## 2024-02-20 DIAGNOSIS — Z87891 Personal history of nicotine dependence: Secondary | ICD-10-CM | POA: Diagnosis not present

## 2024-02-20 DIAGNOSIS — E785 Hyperlipidemia, unspecified: Secondary | ICD-10-CM | POA: Insufficient documentation

## 2024-02-20 DIAGNOSIS — Q399 Congenital malformation of esophagus, unspecified: Secondary | ICD-10-CM | POA: Insufficient documentation

## 2024-02-20 DIAGNOSIS — Z7951 Long term (current) use of inhaled steroids: Secondary | ICD-10-CM | POA: Diagnosis not present

## 2024-02-20 DIAGNOSIS — K449 Diaphragmatic hernia without obstruction or gangrene: Secondary | ICD-10-CM | POA: Diagnosis not present

## 2024-02-20 HISTORY — PX: ESOPHAGOGASTRODUODENOSCOPY: SHX5428

## 2024-02-20 SURGERY — EGD (ESOPHAGOGASTRODUODENOSCOPY)
Anesthesia: General

## 2024-02-20 MED ORDER — LIDOCAINE HCL (PF) 2 % IJ SOLN
INTRAMUSCULAR | Status: AC
  Filled 2024-02-20: qty 5

## 2024-02-20 MED ORDER — PROPOFOL 1000 MG/100ML IV EMUL
INTRAVENOUS | Status: AC
  Filled 2024-02-20: qty 100

## 2024-02-20 MED ORDER — EPHEDRINE 5 MG/ML INJ
INTRAVENOUS | Status: AC
  Filled 2024-02-20: qty 5

## 2024-02-20 MED ORDER — PROPOFOL 10 MG/ML IV BOLUS
INTRAVENOUS | Status: DC | PRN
  Administered 2024-02-20 (×2): 50 mg via INTRAVENOUS

## 2024-02-20 MED ORDER — GLYCOPYRROLATE 0.2 MG/ML IJ SOLN
INTRAMUSCULAR | Status: AC
  Filled 2024-02-20: qty 1

## 2024-02-20 MED ORDER — SODIUM CHLORIDE 0.9 % IV SOLN: INTRAVENOUS | Status: DC

## 2024-02-20 MED ORDER — PROPOFOL 500 MG/50ML IV EMUL
INTRAVENOUS | Status: DC | PRN
  Administered 2024-02-20: 75 ug/kg/min via INTRAVENOUS

## 2024-02-20 MED ORDER — LIDOCAINE HCL (CARDIAC) PF 100 MG/5ML IV SOSY
PREFILLED_SYRINGE | INTRAVENOUS | Status: DC | PRN
  Administered 2024-02-20: 80 mg via INTRAVENOUS

## 2024-02-20 MED ORDER — DEXMEDETOMIDINE HCL IN NACL 80 MCG/20ML IV SOLN
INTRAVENOUS | Status: DC | PRN
Start: 2024-02-20 — End: 2024-02-20
  Administered 2024-02-20: 20 ug via INTRAVENOUS

## 2024-02-20 NOTE — Interval H&P Note (Signed)
 History and Physical Interval Note:  02/20/2024 10:18 AM  Rodney Howe  has presented today for surgery, with the diagnosis of Acute gastric ulcer without hemorrhage or perforation.  The various methods of treatment have been discussed with the patient and family. After consideration of risks, benefits and other options for treatment, the patient has consented to  Procedure(s): EGD (ESOPHAGOGASTRODUODENOSCOPY) (N/A) as a surgical intervention.  The patient's history has been reviewed, patient examined, no change in status, stable for surgery.  I have reviewed the patient's chart and labs.  Questions were answered to the patient's satisfaction.     [name]  Ok to proceed with EGD

## 2024-02-20 NOTE — Anesthesia Preprocedure Evaluation (Signed)
 Anesthesia Evaluation  Patient identified by MRN, date of birth, ID band Patient awake    Reviewed: Allergy & Precautions, NPO status , Patient's Chart, lab work & pertinent test results  History of Anesthesia Complications Negative for: history of anesthetic complications  Airway Mallampati: III  TM Distance: <3 FB Neck ROM: full    Dental  (+) Poor Dentition, Missing   Pulmonary shortness of breath and with exertion, COPD, former smoker   Pulmonary exam normal        Cardiovascular negative cardio ROS Normal cardiovascular exam     Neuro/Psych negative neurological ROS  negative psych ROS   GI/Hepatic Neg liver ROS, PUD,GERD  Controlled,,  Endo/Other  negative endocrine ROS    Renal/GU negative Renal ROS  negative genitourinary   Musculoskeletal   Abdominal   Peds  Hematology negative hematology ROS (+)   Anesthesia Other Findings Past Medical History: No date: BPH (benign prostatic hyperplasia) No date: COPD (chronic obstructive pulmonary disease) (HCC) No date: COPD (chronic obstructive pulmonary disease) (HCC) No date: GERD (gastroesophageal reflux disease) No date: HLD (hyperlipidemia)  Past Surgical History: No date: ANKLE SURGERY 09/19/2020: COLONOSCOPY WITH PROPOFOL ; N/A     Comment:  Procedure: COLONOSCOPY WITH PROPOFOL ;  Surgeon: Therisa Bi, MD;  Location: [facility] ENDOSCOPY;  Service:               Gastroenterology;  Laterality: N/A; 09/02/2023: ESOPHAGOGASTRODUODENOSCOPY; N/A     Comment:  Procedure: EGD (ESOPHAGOGASTRODUODENOSCOPY);  Surgeon:               Jinny Carmine, MD;  Location: [facility] ENDOSCOPY;  Service:               Endoscopy;  Laterality: N/A;  BMI    Body Mass Index: 27.96 kg/m      Reproductive/Obstetrics negative OB ROS                              Anesthesia Physical Anesthesia Plan  ASA: 3  Anesthesia Plan: General   Post-op Pain  Management:    Induction: Intravenous  PONV Risk Score and Plan: Propofol  infusion and TIVA  Airway Management Planned: Natural Airway and Nasal Cannula  Additional Equipment:   Intra-op Plan:   Post-operative Plan:   Informed Consent: I have reviewed the patients History and Physical, chart, labs and discussed the procedure including the risks, benefits and alternatives for the proposed anesthesia with the patient or authorized representative who has indicated his/her understanding and acceptance.     Dental Advisory Given  Plan Discussed with: Anesthesiologist, CRNA and Surgeon  Anesthesia Plan Comments: (Patient consented for risks of anesthesia including but not limited to:  - adverse reactions to medications - risk of airway placement if required - damage to eyes, teeth, lips or other oral mucosa - nerve damage due to positioning  - sore throat or hoarseness - Damage to heart, brain, nerves, lungs, other parts of body or loss of life  Patient voiced understanding and assent.)        Anesthesia Quick Evaluation

## 2024-02-20 NOTE — Anesthesia Postprocedure Evaluation (Signed)
 Anesthesia Post Note  Patient: Rodney Howe  Procedure(s) Performed: EGD (ESOPHAGOGASTRODUODENOSCOPY)  Patient location during evaluation: Endoscopy Anesthesia Type: General Level of consciousness: awake and alert Pain management: pain level controlled Vital Signs Assessment: post-procedure vital signs reviewed and stable Respiratory status: spontaneous breathing, nonlabored ventilation and respiratory function stable Cardiovascular status: blood pressure returned to baseline and stable Postop Assessment: no apparent nausea or vomiting Anesthetic complications: no   No notable events documented.   Last Vitals:  Vitals:   02/20/24 1043 02/20/24 1053  BP: 113/74 115/74  Pulse: 97 87  Resp: 12 (!) 25  Temp:  (!) 36.3 C  SpO2: 98% 91%    Last Pain:  Vitals:   02/20/24 1053  TempSrc: Temporal  PainSc: 0-No pain                 Fairy POUR Vanderbilt Ranieri

## 2024-02-20 NOTE — Transfer of Care (Signed)
 Immediate Anesthesia Transfer of Care Note  Patient: Rodney Howe  Procedure(s) Performed: EGD (ESOPHAGOGASTRODUODENOSCOPY)  Patient Location: PACU  Anesthesia Type:General  Level of Consciousness: sedated  Airway & Oxygen Therapy: Patient Spontanous Breathing  Post-op Assessment: Report given to RN and Post -op Vital signs reviewed and stable  Post vital signs: Reviewed and stable  Last Vitals:  Vitals Value Taken Time  BP 99/69 02/20/24 10:33  Temp 36.3 C 02/20/24 10:33  Pulse 88 02/20/24 10:34  Resp 24 02/20/24 10:34  SpO2 93 % 02/20/24 10:34  Vitals shown include unfiled device data.  Last Pain:  Vitals:   02/20/24 1033  TempSrc: Temporal  PainSc: Asleep         Complications: No notable events documented.

## 2024-02-20 NOTE — Op Note (Addendum)
 [facility] Gastroenterology Patient Name: Rodney Howe Procedure Date: 02/20/2024 10:18 AM MRN: [id number] Account #: [id number] Date of Birth: Dec 13, 1950 Admit Type: Outpatient Age: 73 Room: [facility] ENDO ROOM 3 Gender: Male Note Status: Supervisor Override Instrument Name: Barnie GI Scope [phone] Procedure:             Upper GI endoscopy Indications:           Acute gastric ulcer Providers:             Ole Schick MD, MD Referring MD:          Michelene Cower (Referring MD) Medicines:             Monitored Anesthesia Care Complications:         No immediate complications. Estimated blood loss:                         Minimal. Procedure:             Pre-Anesthesia Assessment:                        - Prior to the procedure, a History and Physical was                         performed, and patient medications and allergies were                         reviewed. The patient is competent. The risks and                         benefits of the procedure and the sedation options and                         risks were discussed with the patient. All questions                         were answered and informed consent was obtained.                         Patient identification and proposed procedure were                         verified by the physician, the nurse, the                         anesthesiologist, the anesthetist and the technician                         in the endoscopy suite. Mental Status Examination:                         alert and oriented. Airway Examination: normal                         oropharyngeal airway and neck mobility. Respiratory                         Examination: clear to auscultation. CV Examination:  normal. Prophylactic Antibiotics: The patient does not                         require prophylactic antibiotics. Prior                         Anticoagulants: The patient has taken no anticoagulant                          or antiplatelet agents. ASA Grade Assessment: III - A                         patient with severe systemic disease. After reviewing                         the risks and benefits, the patient was deemed in                         satisfactory condition to undergo the procedure. The                         anesthesia plan was to use monitored anesthesia care                         (MAC). Immediately prior to administration of                         medications, the patient was re-assessed for adequacy                         to receive sedatives. The heart rate, respiratory                         rate, oxygen saturations, blood pressure, adequacy of                         pulmonary ventilation, and response to care were                         monitored throughout the procedure. The physical                         status of the patient was re-assessed after the                         procedure.                        After obtaining informed consent, the endoscope was                         passed under direct vision. Throughout the procedure,                         the patient's blood pressure, pulse, and oxygen                         saturations were monitored continuously. The Endoscope  was introduced through the mouth, and advanced to the                         second part of duodenum. The upper GI endoscopy was                         accomplished without difficulty. The patient tolerated                         the procedure well. Findings:      A 5 cm hiatal hernia was present.      The lower third of the esophagus was moderately tortuous.      The exam of the esophagus was otherwise normal.      The entire examined stomach was normal. Biopsies were taken with a cold       forceps for Helicobacter pylori testing. Estimated blood loss was       minimal.      The examined duodenum was normal. Impression:            - 5 cm hiatal hernia.                         - Tortuous esophagus.                        - Normal stomach. Biopsied.                        - Normal examined duodenum. Recommendation:        - Discharge patient to home.                        - Resume previous diet.                        - Continue present medications.                        - Await pathology results.                        - Return to referring physician as previously                         scheduled. Procedure Code(s):     --- Professional ---                        [phone], Esophagogastroduodenoscopy, flexible,                         transoral; with biopsy, single or multiple Diagnosis Code(s):     --- Professional ---                        K44.9, Diaphragmatic hernia without obstruction or                         gangrene                        Q39.9, Congenital malformation of esophagus,  unspecified                        K27.9, Peptic ulcer, site unspecified, unspecified as                         acute or chronic, without hemorrhage or perforation CPT copyright 2022 American Medical Association. All rights reserved. The codes documented in this report are preliminary and upon coder review may  be revised to meet current compliance requirements. Ole Schick MD, MD 02/20/2024 10:33:11 AM Number of Addenda: 0 Note Initiated On: 02/20/2024 10:18 AM Estimated Blood Loss:  Estimated blood loss was minimal.      [facility]

## 2024-02-20 NOTE — H&P (Signed)
 Outpatient short stay form Pre-procedure 02/20/2024  Rodney ONEIDA Schick, MD  Primary Physician: Leavy Mole, PA-C  Reason for visit:  PUD  History of present illness:    73 y/o gentleman with history of alcohol abuse, hld, and COPD here for EGD to assess healing of gastric ulcers. No blood thinners. No family history of GI malignancies. No neck or abdominal surgeries.    Current Facility-Administered Medications:    0.9 %  sodium chloride  infusion, , Intravenous, Continuous, Jennilee Demarco, Rodney ONEIDA, MD, Last Rate: 20 mL/hr at 02/20/24 0952, New Bag at 02/20/24 0952  Medications Prior to Admission  Medication Sig Dispense Refill Last Dose/Taking   amitriptyline  (ELAVIL ) 25 MG tablet Take 25 mg by mouth at bedtime.   02/19/2024   atorvastatin  (LIPITOR) 40 MG tablet Take 1 tablet (40 mg total) by mouth every evening. 90 tablet 3 02/19/2024 Evening   cyanocobalamin (VITAMIN B12) 1000 MCG tablet Take 1 tablet (1,000 mcg total) by mouth daily. 90 tablet 1 Past Week   fenofibrate 160 MG tablet Take 160 mg by mouth daily.   02/19/2024   finasteride  (PROSCAR ) 5 MG tablet Take 1 tablet (5 mg total) by mouth daily. 30 tablet 11 02/19/2024   Fluticasone-Umeclidin-Vilant (TRELEGY ELLIPTA ) 100-62.5-25 MCG/ACT AEPB Inhale 1 puff into the lungs daily. 3 each 3 02/20/2024 Morning   folic acid  (FOLVITE ) 1 MG tablet Take 1 tablet (1 mg total) by mouth daily. 30 tablet 3 Past Week   pantoprazole  (PROTONIX ) 40 MG tablet Take 1 tablet (40 mg total) by mouth 2 (two) times daily. 60 tablet 3 02/19/2024   tamsulosin  (FLOMAX ) 0.4 MG CAPS capsule Take 1 capsule (0.4 mg total) by mouth daily. 30 capsule 11 02/19/2024   thiamine  (VITAMIN B-1) 100 MG tablet Take 1 tablet (100 mg total) by mouth daily. 30 tablet 3 Past Week   acetaminophen  (TYLENOL ) 325 MG tablet Take 2 tablets (650 mg total) by mouth every 6 (six) hours as needed for mild pain (pain score 1-3) or fever. 30 tablet 0    fluorouracil  (EFUDEX ) 5 % cream Apply  twice daily to nose, right hand until redness and irritation occurs, then stop 40 g 0    Fluticasone-Umeclidin-Vilant (TRELEGY ELLIPTA ) 100-62.5-25 MCG/ACT AEPB Inhale 1 puff into the lungs daily. 14 each 0    senna-docusate (SENOKOT-S) 8.6-50 MG tablet Take 1 tablet by mouth 2 (two) times daily. 30 tablet 0      No Known Allergies   Past Medical History:  Diagnosis Date   BPH (benign prostatic hyperplasia)    COPD (chronic obstructive pulmonary disease) (HCC)    COPD (chronic obstructive pulmonary disease) (HCC)    GERD (gastroesophageal reflux disease)    HLD (hyperlipidemia)     Review of systems:  Otherwise negative.    Physical Exam  Gen: Alert, oriented. Appears stated age.  HEENT: PERRLA. Lungs: No respiratory distress CV: RRR Abd: soft, benign, no masses Ext: No edema    Planned procedures: Proceed with EGD. The patient understands the nature of the planned procedure, indications, risks, alternatives and potential complications including but not limited to bleeding, infection, perforation, damage to internal organs and possible oversedation/side effects from anesthesia. The patient agrees and gives consent to proceed.  Please refer to procedure notes for findings, recommendations and patient disposition/instructions.     Rodney ONEIDA Schick, MD [facility] Gastroenterology

## 2024-02-23 LAB — SURGICAL PATHOLOGY

## 2024-02-26 ENCOUNTER — Telehealth: Payer: Self-pay

## 2024-02-26 DIAGNOSIS — Z87891 Personal history of nicotine dependence: Secondary | ICD-10-CM

## 2024-02-26 DIAGNOSIS — Z122 Encounter for screening for malignant neoplasm of respiratory organs: Secondary | ICD-10-CM

## 2024-02-26 NOTE — Telephone Encounter (Signed)
 LVM to discuss results and offer ILD consult.

## 2024-03-01 NOTE — Telephone Encounter (Signed)
 LVM and mailed letter to home.

## 2024-03-04 NOTE — Telephone Encounter (Signed)
 LVM on cell and patient's alternate contact number.

## 2024-03-05 NOTE — Addendum Note (Signed)
 Addended by: ANITRA AQUAS D on: 03/05/2024 10:10 AM   Modules accepted: Orders

## 2024-03-05 NOTE — Telephone Encounter (Signed)
 Dr Malka seen pt in consult 12/2023. I messaged Dr Malka to view scan results regarding interstitial lung findings. Dr Malka response  No further evaluation needed.  Spoke with pt and advised that nodule seen on previous scan has resolved. Advised to keep 6 month follow up appt with Dr Malka. We will repeat CT in 1 year. Results/ plans faxed to PCP.

## 2024-03-11 ENCOUNTER — Other Ambulatory Visit: Payer: Self-pay | Admitting: Family Medicine

## 2024-03-11 DIAGNOSIS — J449 Chronic obstructive pulmonary disease, unspecified: Secondary | ICD-10-CM

## 2024-03-15 NOTE — Telephone Encounter (Signed)
 Requested Prescriptions  Refused Prescriptions Disp Refills   SPIRIVA  HANDIHALER 18 MCG CAPS [Pharmacy Med Name: Spiriva  HandiHaler 18 MCG Inhalation Capsule] 90 capsule 3    Sig: INHALE THE CONTENTS OF 1 CAPSULE BY MOUTH VIA INHALATION DEVICE  DAILY     Pulmonology:  Anticholinergic Agents Passed - 03/15/2024  1:47 PM      Passed - Valid encounter within last 12 months    Recent Outpatient Visits           1 month ago Chronic obstructive pulmonary disease, unspecified COPD type [facility])   [facility] Leavy Mole, PA-C   4 months ago Acute blood loss anemia   [facility] Leavy Mole, PA-C   5 months ago Encounter to establish care with new doctor   [facility] Leavy Mole, [place]       Future Appointments             In 1 week Claudene Lehmann, MD [facility]   In 4 months McGowan, Clotilda DELENA RIGGERS [facility] [place]   In 9 months Claudene Lehmann, MD [facility]

## 2024-03-25 ENCOUNTER — Ambulatory Visit: Admitting: Dermatology

## 2024-03-25 ENCOUNTER — Ambulatory Visit (INDEPENDENT_AMBULATORY_CARE_PROVIDER_SITE_OTHER)

## 2024-03-25 DIAGNOSIS — Z0000 Encounter for general adult medical examination without abnormal findings: Secondary | ICD-10-CM

## 2024-03-25 NOTE — Progress Notes (Signed)
 Subjective:   Rodney Howe is a 73 y.o. who presents for a Medicare Wellness preventive visit.  As a reminder, Annual Wellness Visits don't include a physical exam, and some assessments may be limited, especially if this visit is performed virtually. We may recommend an in-person follow-up visit with your provider if needed.  Visit Complete: Virtual I connected with  Rodney Howe on 03/25/24 by a audio enabled telemedicine application and verified that I am speaking with the correct person using two identifiers.  Patient Location: Home  Provider Location: Home Office  I discussed the limitations of evaluation and management by telemedicine. The patient expressed understanding and agreed to proceed.  Vital Signs: Because this visit was a virtual/telehealth visit, some criteria may be missing or patient reported. Any vitals not documented were not able to be obtained and vitals that have been documented are patient reported.  VideoDeclined- This patient declined Librarian, academic. Therefore the visit was completed with audio only.  Persons Participating in Visit: Patient.  AWV Questionnaire: No: Patient Medicare AWV questionnaire was not completed prior to this visit.  Cardiac Risk Factors include: advanced age (>52men, >52 women);male gender;dyslipidemia     Objective:    Today's Vitals   03/25/24 1542  PainSc: 4    There is no height or weight on file to calculate BMI.     03/25/2024    3:48 PM 02/20/2024    9:35 AM 01/26/2024   10:18 AM 10/28/2023    1:00 PM 10/15/2023    2:14 PM 10/13/2023    3:48 PM 09/02/2023   12:45 PM  Advanced Directives  Does Patient Have a Medical Advance Directive? No No No No No No No  Would patient like information on creating a medical advance directive? No - Patient declined   No - Patient declined No - Patient declined No - Patient declined No - Patient declined    Current Medications  (verified) Outpatient Encounter Medications as of 03/25/2024  Medication Sig   acetaminophen  (TYLENOL ) 325 MG tablet Take 2 tablets (650 mg total) by mouth every 6 (six) hours as needed for mild pain (pain score 1-3) or fever.   amitriptyline  (ELAVIL ) 25 MG tablet Take 25 mg by mouth at bedtime.   atorvastatin  (LIPITOR) 40 MG tablet Take 1 tablet (40 mg total) by mouth every evening.   cyanocobalamin (VITAMIN B12) 1000 MCG tablet Take 1 tablet (1,000 mcg total) by mouth daily.   fenofibrate 160 MG tablet Take 160 mg by mouth daily.   finasteride  (PROSCAR ) 5 MG tablet Take 1 tablet (5 mg total) by mouth daily.   fluorouracil  (EFUDEX ) 5 % cream Apply twice daily to nose, right hand until redness and irritation occurs, then stop   Fluticasone-Umeclidin-Vilant (TRELEGY ELLIPTA ) 100-62.5-25 MCG/ACT AEPB Inhale 1 puff into the lungs daily.   Fluticasone-Umeclidin-Vilant (TRELEGY ELLIPTA ) 100-62.5-25 MCG/ACT AEPB Inhale 1 puff into the lungs daily.   folic acid  (FOLVITE ) 1 MG tablet Take 1 tablet (1 mg total) by mouth daily.   pantoprazole  (PROTONIX ) 40 MG tablet Take 1 tablet (40 mg total) by mouth 2 (two) times daily.   senna-docusate (SENOKOT-S) 8.6-50 MG tablet Take 1 tablet by mouth 2 (two) times daily.   tamsulosin  (FLOMAX ) 0.4 MG CAPS capsule Take 1 capsule (0.4 mg total) by mouth daily.   thiamine  (VITAMIN B-1) 100 MG tablet Take 1 tablet (100 mg total) by mouth daily.   No facility-administered encounter medications on file as of 03/25/2024.  Allergies (verified) Patient has no known allergies.   History: Past Medical History:  Diagnosis Date   BPH (benign prostatic hyperplasia)    COPD (chronic obstructive pulmonary disease) (HCC)    COPD (chronic obstructive pulmonary disease) (HCC)    GERD (gastroesophageal reflux disease)    HLD (hyperlipidemia)    Past Surgical History:  Procedure Laterality Date   ANKLE SURGERY     COLONOSCOPY WITH PROPOFOL  N/A 09/19/2020   Procedure:  COLONOSCOPY WITH PROPOFOL ;  Surgeon: Therisa Bi, MD;  Location: [facility] ENDOSCOPY;  Service: Gastroenterology;  Laterality: N/A;   ESOPHAGOGASTRODUODENOSCOPY N/A 09/02/2023   Procedure: EGD (ESOPHAGOGASTRODUODENOSCOPY);  Surgeon: Jinny Carmine, MD;  Location: [facility] ENDOSCOPY;  Service: Endoscopy;  Laterality: N/A;   ESOPHAGOGASTRODUODENOSCOPY N/A 02/20/2024   Procedure: EGD (ESOPHAGOGASTRODUODENOSCOPY);  Surgeon: Maryruth Ole DASEN, MD;  Location: [facility] ENDOSCOPY;  Service: Endoscopy;  Laterality: N/A;   Family History  Problem Relation Age of Onset   Heart attack Mother    Prostate cancer Father    Social History   Socioeconomic History   Marital status: Single    Spouse name: Not on file   Number of children: Not on file   Years of education: Not on file   Highest education level: Not on file  Occupational History   Not on file  Tobacco Use   Smoking status: Former    Current packs/day: 0.00    Average packs/day: 2.0 packs/day for 49.0 years (98.0 ttl pk-yrs)    Types: Cigarettes    Start date: 07/12/1971    Quit date: 07/11/2020    Years since quitting: 3.7   Smokeless tobacco: Never  Substance and Sexual Activity   Alcohol use: Yes    Alcohol/week: 36.0 standard drinks of alcohol    Types: 36 Cans of beer per week   Drug use: Not Currently   Sexual activity: Not Currently  Other Topics Concern   Not on file  Social History Narrative   Not on file   Social Drivers of Health   Financial Resource Strain: Low Risk  (03/25/2024)   Overall Financial Resource Strain (CARDIA)    Difficulty of Paying Living Expenses: Not hard at all  Food Insecurity: No Food Insecurity (03/25/2024)   Hunger Vital Sign    Worried About Running Out of Food in the Last Year: Never true    Ran Out of Food in the Last Year: Never true  Transportation Needs: No Transportation Needs (03/25/2024)   PRAPARE - Administrator, Civil Service (Medical): No    Lack of Transportation (Non-Medical):  No  Physical Activity: Sufficiently Active (03/25/2024)   Exercise Vital Sign    Days of Exercise per Week: 6 days    Minutes of Exercise per Session: 60 min  Stress: No Stress Concern Present (03/25/2024)   [employer] of Occupational Health - Occupational Stress Questionnaire    Feeling of Stress: Not at all  Social Connections: Socially Isolated (03/25/2024)   Social Connection and Isolation Panel    Frequency of Communication with Friends and Family: Three times a week    Frequency of Social Gatherings with Friends and Family: Once a week    Attends Religious Services: Never    Database administrator or Organizations: No    Attends Engineer, structural: Never    Marital Status: Never married    Tobacco Counseling Counseling given: Not Answered    Clinical Intake:  Pre-visit preparation completed: Yes  Pain : 0-10 Pain Score: 4  Pain Type: Chronic pain Pain Location: Shoulder Pain Orientation: Right Pain Descriptors / Indicators: Aching, Constant Pain Onset: More than a month ago Pain Frequency: Constant     BMI - recorded: 27.1 Nutritional Status: BMI 25 -29 Overweight Nutritional Risks: None Diabetes: No  No results found for: HGBA1C   How often do you need to have someone help you when you read instructions, pamphlets, or other written materials from your doctor or pharmacy?: 1 - Never  Interpreter Needed?: No  Information entered by :: JHONNIE DAS, LPN   Activities of Daily Living     03/25/2024    3:51 PM 10/07/2023    2:16 PM  In your present state of health, do you have any difficulty performing the following activities:  Hearing? 0 0  Vision? 0 0  Difficulty concentrating or making decisions? 0 0  Walking or climbing stairs? 0 0  Dressing or bathing? 0 0  Doing errands, shopping? 0 0  Preparing Food and eating ? N   Using the Toilet? N   In the past six months, have you accidently leaked urine? N   Do you have problems  with loss of bowel control? N   Managing your Medications? N   Managing your Finances? N   Housekeeping or managing your Housekeeping? N     Patient Care Team: Leavy Mole, PA-C as PCP - General (Family Medicine) Jacobo Evalene PARAS, MD as Consulting Physician (Oncology) [facility]  I have updated your Care Teams any recent Medical Services you may have received from other providers in the past year.     Assessment:   This is a routine wellness examination for Rodney Howe.  Hearing/Vision screen Hearing Screening - Comments:: NO AIDS- NEEDS TO GET CHECKED Vision Screening - Comments:: READERS- DR.BELL   Goals Addressed             This Visit's Progress    DIET - EAT MORE FRUITS AND VEGETABLES         Depression Screen     03/25/2024    3:46 PM 02/10/2024   11:21 AM 11/10/2023   10:48 AM 10/15/2023    2:28 PM 10/07/2023    2:16 PM  PHQ 2/9 Scores  PHQ - 2 Score 0 0 6 0 0  PHQ- 9 Score 0 1 21      Fall Risk     03/25/2024    3:50 PM 11/10/2023   10:40 AM 10/07/2023    2:16 PM  Fall Risk   Falls in the past year? 1 1 1   Number falls in past yr: 0 1 0  Injury with Fall? 1 1 0  Risk for fall due to :  No Fall Risks;Impaired balance/gait No Fall Risks  Follow up Falls evaluation completed;Falls prevention discussed Falls evaluation completed Falls prevention discussed    MEDICARE RISK AT HOME:  Medicare Risk at Home Any stairs in or around the home?: Yes If so, are there any without handrails?: No Home free of loose throw rugs in walkways, pet beds, electrical cords, etc?: Yes Adequate lighting in your home to reduce risk of falls?: Yes Life alert?: No Use of a cane, walker or w/c?: No Grab bars in the bathroom?: No Shower chair or bench in shower?: No Elevated toilet seat or a handicapped toilet?: No  TIMED UP AND GO:  Was the test performed?  No  Cognitive Function: 6CIT completed        03/25/2024    3:54  PM  6CIT Screen  What Year? 0  points  What month? 0 points  What time? 0 points  Count back from 20 0 points  Months in reverse 0 points  Repeat phrase 0 points  Total Score 0 points    Immunizations Immunization History  Administered Date(s) Administered   INFLUENZA, HIGH DOSE SEASONAL PF 06/10/2023   Moderna Sars-Covid-2 Vaccination 07/14/2019, 08/11/2019, 04/14/2020   Pneumococcal Conjugate-13 12/15/2017   Pneumococcal Polysaccharide-23 11/17/2017   Tdap 02/12/2013, 08/21/2018, 07/13/2020   Zoster Recombinant(Shingrix) 06/10/2023, 08/18/2023    Screening Tests Health Maintenance  Topic Date Due   Influenza Vaccine  01/02/2024   COVID-19 Vaccine (4 - 2025-26 season) 02/02/2024   Hepatitis C Screening  02/08/2025 (Originally 11/09/1968)   Lung Cancer Screening  02/18/2025   Medicare Annual Wellness (AWV)  03/25/2025   Colonoscopy  09/20/2027   DTaP/Tdap/Td (4 - Td or Tdap) 07/13/2030   Pneumococcal Vaccine: 50+ Years  Completed   Zoster Vaccines- Shingrix  Completed   Meningococcal B Vaccine  Aged Out    Health Maintenance Items Addressed: UTD W/ SHOTS;LUNG CA SCREENING UTD; COLONOSCOPY UTD   Additional Screening:  Vision Screening: Recommended annual ophthalmology exams for early detection of glaucoma and other disorders of the eye. Is the patient up to date with their annual eye exam?  Yes  Who is the provider or what is the name of the office in which the patient attends annual eye exams? DR.BELL  Dental Screening: Recommended annual dental exams for proper oral hygiene  Community Resource Referral / Chronic Care Management: CRR required this visit?  No   CCM required this visit?  No   Plan:    I have personally reviewed and noted the following in the patient's chart:   Medical and social history Use of alcohol, tobacco or illicit drugs  Current medications and supplements including opioid prescriptions. Patient is not currently taking opioid prescriptions. Functional ability and  status Nutritional status Physical activity Advanced directives List of other physicians Hospitalizations, surgeries, and ER visits in previous 12 months Vitals Screenings to include cognitive, depression, and falls Referrals and appointments  In addition, I have reviewed and discussed with patient certain preventive protocols, quality metrics, and best practice recommendations. A written personalized care plan for preventive services as well as general preventive health recommendations were provided to patient.   Jhonnie GORMAN Das, LPN   [id number]   After Visit Summary: (MyChart) Due to this being a telephonic visit, the after visit summary with patients personalized plan was offered to patient via MyChart   Notes: Nothing significant to report at this time.

## 2024-03-25 NOTE — Patient Instructions (Addendum)
 Rodney Howe,  Thank you for taking the time for your Medicare Wellness Visit. I appreciate your continued commitment to your health goals. Please review the care plan we discussed, and feel free to reach out if I can assist you further.  Medicare recommends these wellness visits once per year to help you and your care team stay ahead of potential health issues. These visits are designed to focus on prevention, allowing your provider to concentrate on managing your acute and chronic conditions during your regular appointments.  Please note that Annual Wellness Visits do not include a physical exam. Some assessments may be limited, especially if the visit was conducted virtually. If needed, we may recommend a separate in-person follow-up with your provider.  Ongoing Care Seeing your primary care provider every 3 to 6 months helps us  monitor your health and provide consistent, personalized care.   Referrals If a referral was made during today's visit and you haven't received any updates within two weeks, please contact the referred provider directly to check on the status.  Recommended Screenings:  Health Maintenance  Topic Date Due   Flu Shot  01/02/2024   COVID-19 Vaccine (4 - 2025-26 season) 02/02/2024   Hepatitis C Screening  02/08/2025*   Screening for Lung Cancer  02/18/2025   Medicare Annual Wellness Visit  03/25/2025   Colon Cancer Screening  09/20/2027   DTaP/Tdap/Td vaccine (4 - Td or Tdap) 07/13/2030   Pneumococcal Vaccine for age over 61  Completed   Zoster (Shingles) Vaccine  Completed   Meningitis B Vaccine  Aged Out  *Topic was postponed. The date shown is not the original due date.     Advance Care Planning is important because it: Ensures you receive medical care that aligns with your values, goals, and preferences. Provides guidance to your family and loved ones, reducing the emotional burden of decision-making during critical moments.  Vision: Annual vision  screenings are recommended for early detection of glaucoma, cataracts, and diabetic retinopathy. These exams can also reveal signs of chronic conditions such as diabetes and high blood pressure.  Dental: Annual dental screenings help detect early signs of oral cancer, gum disease, and other conditions linked to overall health, including heart disease and diabetes.  Please see the attached documents for additional preventive care recommendations.   NEXT AWV  03/31/25 @ 2:40 PM IN PERSON

## 2024-04-15 ENCOUNTER — Ambulatory Visit: Admitting: Orthopedic Surgery

## 2024-04-27 ENCOUNTER — Ambulatory Visit (INDEPENDENT_AMBULATORY_CARE_PROVIDER_SITE_OTHER): Admitting: Dermatology

## 2024-04-27 ENCOUNTER — Encounter: Payer: Self-pay | Admitting: Dermatology

## 2024-04-27 DIAGNOSIS — L570 Actinic keratosis: Secondary | ICD-10-CM | POA: Diagnosis not present

## 2024-04-27 DIAGNOSIS — R234 Changes in skin texture: Secondary | ICD-10-CM

## 2024-04-27 DIAGNOSIS — L578 Other skin changes due to chronic exposure to nonionizing radiation: Secondary | ICD-10-CM | POA: Diagnosis not present

## 2024-04-27 DIAGNOSIS — L84 Corns and callosities: Secondary | ICD-10-CM | POA: Diagnosis not present

## 2024-04-27 DIAGNOSIS — W908XXA Exposure to other nonionizing radiation, initial encounter: Secondary | ICD-10-CM | POA: Diagnosis not present

## 2024-04-27 DIAGNOSIS — L245 Irritant contact dermatitis due to other chemical products: Secondary | ICD-10-CM

## 2024-04-27 NOTE — Progress Notes (Signed)
 Follow-Up Visit   Subjective  Rodney Howe is a 73 y.o. male who presents for the following: Patient here for 3 month AK follow up. Was prescribed fluorouracil  to use at nose and right dorsal hand in July. Patient is using cream and is satisfied with results on the face and wants to know if he should continue using. He is concerned about a bump on his right thumb he would like to get checked.   The following portions of the chart were reviewed this encounter and updated as appropriate: medications, allergies, medical history  Review of Systems:  No other skin or systemic complaints except as noted in HPI or Assessment and Plan.  Objective  Well appearing patient in no apparent distress; mood and affect are within normal limits.  A focused examination was performed of the following areas: Right hand  Relevant exam findings are noted in the Assessment and Plan.    Assessment & Plan   ACTINIC DAMAGE WITH PRECANCEROUS ACTINIC KERATOSES, improving but not at goal Counseling for Topical Chemotherapy Management: Patient exhibits: - Severe, confluent actinic changes with pre-cancerous actinic keratoses that is secondary to cumulative UV radiation exposure over time - Condition that is severe; chronic, not at goal. - diffuse scaly erythematous macules and papules with underlying dyspigmentation - Discussed Prescription Field Treatment topical Chemotherapy for Severe, Chronic Confluent Actinic Changes with Pre-Cancerous Actinic Keratoses Field treatment involves treatment of an entire area of skin that has confluent Actinic Changes (Sun/ Ultraviolet light damage) and PreCancerous Actinic Keratoses by method of PhotoDynamic Therapy (PDT) and/or prescription Topical Chemotherapy agents such as 5-fluorouracil , 5-fluorouracil /calcipotriene, and/or imiquimod.  The purpose is to decrease the number of clinically evident and subclinical PreCancerous lesions to prevent progression to development  of skin cancer by chemically destroying early precancer changes that may or may not be visible.  It has been shown to reduce the risk of developing skin cancer in the treated area. As a result of treatment, redness, scaling, crusting, and open sores may occur during treatment course. One or more than one of these methods may be used and may have to be used several times to control, suppress and eliminate the PreCancerous changes. Discussed treatment course, expected reaction, and possible side effects. - Recommend daily broad spectrum sunscreen SPF 30+ to sun-exposed areas, reapply every 2 hours as needed.  - Staying in the shade or wearing long sleeves, sun glasses (UVA+UVB protection) and wide brim hats (4-inch brim around the entire circumference of the hat) are also recommended. - Call for new or changing lesions.  - Restart 5-fluorouracil  cream twice a day until redness and inflammation occur to affected areas including right dorsal hand. Nose clear today. Reviewed course of treatment and expected reaction.  Patient advised to expect inflammation and crusting and advised that erosions are possible.  Patient advised to be diligent with sun protection during and after treatment. Handout with details of how to apply medication and what to expect provided. Counseled to keep medication out of reach of children and pets.   Irritant contact dermatitis with fissuring Exam: b/l palms with xerosis and scattered superficial fissuring. Visibly stained with car fluids  Treatment Plan: Wear gloves when working with hands   CORN Exam: glassy keratotic papule at right palmar thumb  Treatment Plan: Recommend OTC Amlactin or Cerave SA to aa, BID. Samples of am lactin given to patient.   ACTINIC ELASTOSIS   ACTINIC KERATOSES   CORN OR CALLUS   IRRITANT CONTACT DERMATITIS DUE TO OTHER  CHEMICAL PRODUCTS    Return for as scheduled, w/ Dr. Claudene.  IAlmetta Nora, RMA, am acting as scribe for  Boneta Claudene, MD .   Documentation: I have reviewed the above documentation for accuracy and completeness, and I agree with the above.  Boneta Claudene, MD

## 2024-04-27 NOTE — Patient Instructions (Signed)
 Due to recent changes in healthcare laws, you may see results of your pathology and/or laboratory studies on MyChart before the doctors have had a chance to review them. We understand that in some cases there may be results that are confusing or concerning to you. Please understand that not all results are received at the same time and often the doctors may need to interpret multiple results in order to provide you with the best plan of care or course of treatment. Therefore, we ask that you please give us  2 business days to thoroughly review all your results before contacting the office for clarification. Should we see a critical lab result, you will be contacted sooner.   If You Need Anything After Your Visit  If you have any questions or concerns for your doctor, please call our main line at [phone] and press option 4 to reach your doctor's medical assistant. If no one answers, please leave a voicemail as directed and we will return your call as soon as possible. Messages left after 4 pm will be answered the following business day.   You may also send us  a message via MyChart. We typically respond to MyChart messages within 1-2 business days.  For prescription refills, please ask your pharmacy to contact our office. Our fax number is [phone].  If you have an urgent issue when the clinic is closed that cannot wait until the next business day, you can page your doctor at the number below.    Please note that while we do our best to be available for urgent issues outside of office hours, we are not available 24/7.   If you have an urgent issue and are unable to reach us , you may choose to seek medical care at your doctor's office, retail clinic, urgent care center, or emergency room.  If you have a medical emergency, please immediately call 911 or go to the emergency department.  Pager Numbers  - Dr. Hester: [phone]  - Dr. Jackquline: [phone]  - Dr. Claudene: [phone]    - Dr. Raymund: [phone]  In the event of inclement weather, please call our main line at [phone] for an update on the status of any delays or closures.  Dermatology Medication Tips: Please keep the boxes that topical medications come in in order to help keep track of the instructions about where and how to use these. Pharmacies typically print the medication instructions only on the boxes and not directly on the medication tubes.   If your medication is too expensive, please contact our office at [phone] option 4 or send us  a message through MyChart.   We are unable to tell what your co-pay for medications will be in advance as this is different depending on your insurance coverage. However, we may be able to find a substitute medication at lower cost or fill out paperwork to get insurance to cover a needed medication.   If a prior authorization is required to get your medication covered by your insurance company, please allow us  1-2 business days to complete this process.  Drug prices often vary depending on where the prescription is filled and some pharmacies may offer cheaper prices.  The website www.goodrx.com contains coupons for medications through different pharmacies. The prices here do not account for what the cost may be with help from insurance (it may be cheaper with your insurance), but the website can give you the price if you did not use any insurance.  - You can print the  associated coupon and take it with your prescription to the pharmacy.  - You may also stop by our office during regular business hours and pick up a GoodRx coupon card.  - If you need your prescription sent electronically to a different pharmacy, notify our office through [facility] or by phone at [phone] option 4.     Si Usted Necesita Algo Despus de Su Visita  Tambin puede enviarnos un mensaje a travs de Clinical cytogeneticist. Por lo general respondemos a los mensajes de MyChart en el  transcurso de 1 a 2 das hbiles.  Para renovar recetas, por favor pida a su farmacia que se ponga en contacto con nuestra oficina. Randi lakes de fax es [place] [phone].  Si tiene un asunto urgente cuando la clnica est cerrada y que no puede esperar hasta el siguiente da hbil, puede llamar/localizar a su doctor(a) al nmero que aparece a continuacin.   Por favor, tenga en cuenta que aunque hacemos todo lo posible para estar disponibles para asuntos urgentes fuera del horario de [place], no estamos disponibles las 24 horas del da, los 7 [address]  [address] de la [place].   Si tiene un problema urgente y no puede comunicarse con nosotros, puede optar por buscar atencin mdica  en el consultorio de su doctor(a), en una clnica privada, en un centro de atencin urgente o en una sala de emergencias.  Si tiene Engineer, drilling, por favor llame inmediatamente al 911 o vaya a la sala de emergencias.  Nmeros de bper  - Dr. Hester: [phone]  - Dra. Jackquline: [phone]  - Dr. Claudene: [phone]  - Dra. Kitts: [phone]  En caso de inclemencias del [place], por favor llame a nuestra lnea principal al [phone] para una actualizacin sobre el estado de cualquier retraso o cierre.  Consejos para la medicacin en dermatologa: Por favor, guarde las cajas en las que vienen los medicamentos de uso tpico para ayudarle a seguir las instrucciones sobre dnde y cmo usarlos. Las farmacias generalmente imprimen las instrucciones del medicamento slo en las cajas y no directamente en los tubos del [place].   Si su medicamento es muy caro, por favor, pngase en contacto con landry rieger llamando al [phone] y presione la opcin 4 o envenos un mensaje a travs de Clinical cytogeneticist.   No podemos decirle cul ser su copago por los medicamentos por adelantado ya que esto es diferente dependiendo de la cobertura de su seguro. Sin embargo, es posible que podamos encontrar un medicamento sustituto  a Audiological scientist un formulario para que el seguro cubra el medicamento que se considera necesario.   Si se requiere una autorizacin previa para que su compaa de seguros malta su medicamento, por favor permtanos de 1 a 2 das hbiles para completar este proceso.  Los precios de los medicamentos varan con frecuencia dependiendo del Environmental consultant de dnde se surte la receta y alguna farmacias pueden ofrecer precios ms baratos.  El sitio web www.goodrx.com tiene cupones para medicamentos de Health and safety inspector. Los precios aqu no tienen en cuenta lo que podra costar con la ayuda del seguro (puede ser ms barato con su seguro), pero el sitio web puede darle el precio si no utiliz Tourist information centre manager.  - Puede imprimir el cupn correspondiente y llevarlo con su receta a la farmacia.  - Tambin puede pasar por nuestra oficina durante el horario de atencin regular y Education officer, museum una tarjeta de cupones de GoodRx.  - Si necesita que su receta se enve electrnicamente a [place],  informe a nuestra oficina a travs de MyChart de  o por telfono llamando al [phone] y presione la opcin 4.    Due to recent changes in healthcare laws, you may see results of your pathology and/or laboratory studies on MyChart before the doctors have had a chance to review them. We understand that in some cases there may be results that are confusing or concerning to you. Please understand that not all results are received at the same time and often the doctors may need to interpret multiple results in order to provide you with the best plan of care or course of treatment. Therefore, we ask that you please give us  2 business days to thoroughly review all your results before contacting the office for clarification. Should we see a critical lab result, you will be contacted sooner.   If You Need Anything After Your Visit  If you have any questions or concerns for your doctor, please call our main line at  [phone] and press option 4 to reach your doctor's medical assistant. If no one answers, please leave a voicemail as directed and we will return your call as soon as possible. Messages left after 4 pm will be answered the following business day.   You may also send us  a message via MyChart. We typically respond to MyChart messages within 1-2 business days.  For prescription refills, please ask your pharmacy to contact our office. Our fax number is [phone].  If you have an urgent issue when the clinic is closed that cannot wait until the next business day, you can page your doctor at the number below.    Please note that while we do our best to be available for urgent issues outside of office hours, we are not available 24/7.   If you have an urgent issue and are unable to reach us , you may choose to seek medical care at your doctor's office, retail clinic, urgent care center, or emergency room.  If you have a medical emergency, please immediately call 911 or go to the emergency department.  Pager Numbers  - Dr. Hester: [phone]  - Dr. Jackquline: [phone]  - Dr. Claudene: [phone]   - Dr. Raymund: [phone]  In the event of inclement weather, please call our main line at [phone] for an update on the status of any delays or closures.  Dermatology Medication Tips: Please keep the boxes that topical medications come in in order to help keep track of the instructions about where and how to use these. Pharmacies typically print the medication instructions only on the boxes and not directly on the medication tubes.   If your medication is too expensive, please contact our office at [phone] option 4 or send us  a message through MyChart.   We are unable to tell what your co-pay for medications will be in advance as this is different depending on your insurance coverage. However, we may be able to find a substitute medication at lower cost or fill out paperwork to  get insurance to cover a needed medication.   If a prior authorization is required to get your medication covered by your insurance company, please allow us  1-2 business days to complete this process.  Drug prices often vary depending on where the prescription is filled and some pharmacies may offer cheaper prices.  The website www.goodrx.com contains coupons for medications through different pharmacies. The prices here do not account for what the cost may be with help from insurance (it may  be cheaper with your insurance), but the website can give you the price if you did not use any insurance.  - You can print the associated coupon and take it with your prescription to the pharmacy.  - You may also stop by our office during regular business hours and pick up a GoodRx coupon card.  - If you need your prescription sent electronically to a different pharmacy, notify our office through [facility] or by phone at [phone] option 4.     Si Usted Necesita Algo Despus de Su Visita  Tambin puede enviarnos un mensaje a travs de Clinical cytogeneticist. Por lo general respondemos a los mensajes de MyChart en el transcurso de 1 a 2 das hbiles.  Para renovar recetas, por favor pida a su farmacia que se ponga en contacto con nuestra oficina. Randi lakes de fax es [place] [phone].  Si tiene un asunto urgente cuando la clnica est cerrada y que no puede esperar hasta el siguiente da hbil, puede llamar/localizar a su doctor(a) al nmero que aparece a continuacin.   Por favor, tenga en cuenta que aunque hacemos todo lo posible para estar disponibles para asuntos urgentes fuera del horario de [place], no estamos disponibles las 24 horas del da, los 7 [address]  [address] de la [place].   Si tiene un problema urgente y no puede comunicarse con nosotros, puede optar por buscar atencin mdica  en el consultorio de su doctor(a), en una clnica privada, en un centro de atencin urgente o en una sala de emergencias.  Si  tiene Engineer, drilling, por favor llame inmediatamente al 911 o vaya a la sala de emergencias.  Nmeros de bper  - Dr. Hester: [phone]  - Dra. Jackquline: [phone]  - Dr. Claudene: [phone]  - Dra. Kitts: [phone]  En caso de inclemencias del [place], por favor llame a nuestra lnea principal al [phone] para una actualizacin sobre el estado de cualquier retraso o cierre.  Consejos para la medicacin en dermatologa: Por favor, guarde las cajas en las que vienen los medicamentos de uso tpico para ayudarle a seguir las instrucciones sobre dnde y cmo usarlos. Las farmacias generalmente imprimen las instrucciones del medicamento slo en las cajas y no directamente en los tubos del [place].   Si su medicamento es muy caro, por favor, pngase en contacto con landry rieger llamando al [phone] y presione la opcin 4 o envenos un mensaje a travs de Clinical cytogeneticist.   No podemos decirle cul ser su copago por los medicamentos por adelantado ya que esto es diferente dependiendo de la cobertura de su seguro. Sin embargo, es posible que podamos encontrar un medicamento sustituto a Audiological scientist un formulario para que el seguro cubra el medicamento que se considera necesario.   Si se requiere una autorizacin previa para que su compaa de seguros malta su medicamento, por favor permtanos de 1 a 2 das hbiles para completar este proceso.  Los precios de los medicamentos varan con frecuencia dependiendo del Environmental consultant de dnde se surte la receta y alguna farmacias pueden ofrecer precios ms baratos.  El sitio web www.goodrx.com tiene cupones para medicamentos de Health and safety inspector. Los precios aqu no tienen en cuenta lo que podra costar con la ayuda del seguro (puede ser ms barato con su seguro), pero el sitio web puede darle el precio si no utiliz Tourist information centre manager.  - Puede imprimir el cupn correspondiente y llevarlo con su receta a la farmacia.  - Tambin puede  pasar por nuestra oficina durante el  horario de Visual merchandiser regular y Education officer, museum una tarjeta de cupones de GoodRx.  - Si necesita que su receta se enve electrnicamente a una farmacia diferente, informe a nuestra oficina a travs de MyChart de [place] o por telfono llamando al [phone] y presione la opcin 4.

## 2024-05-13 ENCOUNTER — Ambulatory Visit (INDEPENDENT_AMBULATORY_CARE_PROVIDER_SITE_OTHER): Admitting: Orthopedic Surgery

## 2024-05-13 DIAGNOSIS — G8929 Other chronic pain: Secondary | ICD-10-CM | POA: Diagnosis not present

## 2024-05-13 DIAGNOSIS — M25511 Pain in right shoulder: Secondary | ICD-10-CM

## 2024-05-13 NOTE — Progress Notes (Signed)
 Orthopedic Follow-Up Note  Chronic right shoulder pain   SUBJECTIVE:   Rodney Howe is a 73 y.o. year old who presents to [facility] for 65-month follow-up regarding chronic right shoulder pain.  Patient last seen on 11/19/2023 with fall 2-1/2 months ago that exacerbated right shoulder pain.  Benign x-rays.  Patient received corticosteroid injection.  Patient returns today reporting no symptom improvement.  Patient believes the corticosteroid injection made symptoms worse.  Reports constant posterior  and lateral shoulder pain.  Describes as ache.  Will occasionally radiate to right upper extremity, all the way into fingertips.  Exacerbated with heavy lifting and overhead movement.  Patient has previously taken meloxicam, gabapentin , Tylenol , and a friend's Vicodin with minimal symptom improvement.  Patient no longer able to tolerate NSAIDs, due to secondary bleeding gastric ulcer after meloxicam course (currently managed by GI, on PPI).  Patient states unable to sleep on right side, will wake patient up at night.  Patient reports subjective right upper extremity weakness, making work more difficult.  Patient is right-hand dominant.  Patient very physically active, works as a arboriculturist cars.  Patient suspects precipitating injury/trauma was twisting/yanking motion of the right shoulder when using a drill a few years ago.  Patient with previous history of substance use disorder.  States he abused drugs for 40 years.  Is 16 years sober.  Former cigarette smoker, quit 3 years ago.  Patient does drink alcohol, per semirecent appointment with urology (Sept 2025), reported 36 drinks of alcohol per week.        ROS: A review of systems was performed and is negative unless stated above in HPI    OBJECTIVE:    Constitutional:   The patient is alert and oriented x 3, appears to be stated age and in no distress.   Orthopaedic Examination:  Patient sitting comfortably on examination  table.  Mild shoulder muscular atrophy on right side compared to left side.  Preserved range of motion with flexion, abduction, and external rotation.  Patient with limited internal rotation, only to back pocket, compared to T8 with left shoulder.  Pain and mild weakness with supraspinatus, infraspinatus, and teres minor testing.  No significant discomfort with impingement testing Minnie and Neer).  Mild tenderness with palpation over posterior aspect of right shoulder.  Strong radial pulse.  Brisk capillary refill.  Sensation intact distally.  Positive Jobes.  4/5 strength supraspinatus and infraspinatus.    IMAGING:   X-ray of the right shoulder from 09/03/2023, 3 views (AP, Raynald, Y view) performed at [facility] were reviewed.  No acute fracture.  No dislocation.  Slight elevation of humeral head.  Mild degenerative changes noted about the glenoid with loss of posterior concavity.  Mild degenerative changes on inferior surface of acromion.  Mild downward sloping of acromion.  Mild loss of AC joint spacing and osteophyte formation at [facility] joint.     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  Patient with worsening of right shoulder symptoms.  Constant pain.  Physical exam reveals decreased range of motion and rotator cuff weakness.  Positive Jobe's and atrophy.   Right shoulder x-ray/imaging reveals chronic rotator cuff disease with some early proximal humerus migration.  Patient has failed conservative management of rest, over-the-counter medication, prescription anti-inflammatories, focused exercises and corticosteroid injection.  Patient is right-hand dominant and requires use of right upper extremity for work.  At this time agree with further imaging, order a right shoulder MRI for further evaluation.  Advised patient to call Ortho care office once MRI is scheduled and schedule  a follow-up appointment to discuss MRI results and next treatment steps.  All questions and concerns were answered to the best of my ability.  Patient can call any time with further concerns.   Oneil Horde, MD Orthopedic Surgery & Sports Medicine [facility]

## 2024-05-14 ENCOUNTER — Encounter: Payer: Self-pay | Admitting: Orthopedic Surgery

## 2024-05-17 ENCOUNTER — Encounter: Admitting: Family Medicine

## 2024-05-29 ENCOUNTER — Ambulatory Visit
Admission: RE | Admit: 2024-05-29 | Discharge: 2024-05-29 | Disposition: A | Source: Ambulatory Visit | Attending: Orthopedic Surgery

## 2024-05-29 DIAGNOSIS — G8929 Other chronic pain: Secondary | ICD-10-CM

## 2024-05-31 ENCOUNTER — Other Ambulatory Visit

## 2024-06-01 ENCOUNTER — Ambulatory Visit: Admitting: Oncology

## 2024-06-01 ENCOUNTER — Ambulatory Visit

## 2024-06-10 ENCOUNTER — Inpatient Hospital Stay: Attending: Family Medicine

## 2024-06-11 ENCOUNTER — Inpatient Hospital Stay

## 2024-06-11 ENCOUNTER — Inpatient Hospital Stay: Admitting: Oncology

## 2024-06-11 ENCOUNTER — Telehealth: Payer: Self-pay | Admitting: Oncology

## 2024-06-11 NOTE — Telephone Encounter (Signed)
 Per secure  chat from RN pt no showed labs prior, please r/s MD/iron  appts  I called and lvm for pt that todays appts were being canceled due to not having any lab results. Scheduling phone number provided for pt to call back and r/s all appts

## 2024-06-23 ENCOUNTER — Ambulatory Visit (INDEPENDENT_AMBULATORY_CARE_PROVIDER_SITE_OTHER): Admitting: Family Medicine

## 2024-06-23 ENCOUNTER — Encounter: Payer: Self-pay | Admitting: Family Medicine

## 2024-06-23 VITALS — BP 132/84 | HR 85 | Resp 16 | Ht 65.0 in | Wt 167.0 lb

## 2024-06-23 DIAGNOSIS — K219 Gastro-esophageal reflux disease without esophagitis: Secondary | ICD-10-CM | POA: Diagnosis not present

## 2024-06-23 DIAGNOSIS — N401 Enlarged prostate with lower urinary tract symptoms: Secondary | ICD-10-CM | POA: Diagnosis not present

## 2024-06-23 DIAGNOSIS — J449 Chronic obstructive pulmonary disease, unspecified: Secondary | ICD-10-CM | POA: Diagnosis not present

## 2024-06-23 DIAGNOSIS — Z87898 Personal history of other specified conditions: Secondary | ICD-10-CM | POA: Diagnosis not present

## 2024-06-23 DIAGNOSIS — Z862 Personal history of diseases of the blood and blood-forming organs and certain disorders involving the immune mechanism: Secondary | ICD-10-CM | POA: Diagnosis not present

## 2024-06-23 DIAGNOSIS — G629 Polyneuropathy, unspecified: Secondary | ICD-10-CM

## 2024-06-23 DIAGNOSIS — E538 Deficiency of other specified B group vitamins: Secondary | ICD-10-CM

## 2024-06-23 DIAGNOSIS — F322 Major depressive disorder, single episode, severe without psychotic features: Secondary | ICD-10-CM | POA: Diagnosis not present

## 2024-06-23 DIAGNOSIS — G4700 Insomnia, unspecified: Secondary | ICD-10-CM

## 2024-06-23 DIAGNOSIS — R42 Dizziness and giddiness: Secondary | ICD-10-CM | POA: Diagnosis not present

## 2024-06-23 DIAGNOSIS — E785 Hyperlipidemia, unspecified: Secondary | ICD-10-CM | POA: Diagnosis not present

## 2024-06-23 DIAGNOSIS — F1010 Alcohol abuse, uncomplicated: Secondary | ICD-10-CM

## 2024-06-23 MED ORDER — PANTOPRAZOLE SODIUM 40 MG PO TBEC: 40.0000 mg | DELAYED_RELEASE_TABLET | Freq: Two times a day (BID) | ORAL | 0 refills | Status: AC

## 2024-06-23 MED ORDER — VITAMIN B-12 1000 MCG PO TABS: 1000.0000 ug | ORAL_TABLET | Freq: Every day | ORAL | 0 refills | Status: AC

## 2024-06-23 MED ORDER — GABAPENTIN 300 MG PO CAPS: 300.0000 mg | ORAL_CAPSULE | Freq: Every day | ORAL | 0 refills | Status: AC

## 2024-06-23 MED ORDER — FOLIC ACID 1 MG PO TABS: 1.0000 mg | ORAL_TABLET | Freq: Every day | ORAL | 0 refills | Status: AC

## 2024-06-23 MED ORDER — AMITRIPTYLINE HCL 25 MG PO TABS: 25.0000 mg | ORAL_TABLET | Freq: Every day | ORAL | 0 refills | Status: AC

## 2024-06-23 MED ORDER — VITAMIN B-1 100 MG PO TABS: 100.0000 mg | ORAL_TABLET | Freq: Every day | ORAL | 0 refills | Status: AC

## 2024-06-23 MED ORDER — ATORVASTATIN CALCIUM 40 MG PO TABS: 40.0000 mg | ORAL_TABLET | Freq: Every evening | ORAL | 0 refills | Status: AC

## 2024-06-23 NOTE — Assessment & Plan Note (Addendum)
 Severe depression controlled, see updated PHQ-9 above.   -Continue Amitriptlyine 25mg  nightly, refill provided  Orders:   Vitamin D  (25 hydroxy)   amitriptyline  (ELAVIL ) 25 MG tablet; Take 1 tablet (25 mg total) by mouth at bedtime.   TSH + free T4

## 2024-06-23 NOTE — Patient Instructions (Signed)
 Remember that we are stopping Fenofribate. You will continue Atorvastatin  for your cholesterol.

## 2024-06-23 NOTE — Assessment & Plan Note (Addendum)
 Insomnia overall controlled, minor sleep disturbances. See HPI for details.  -Continue Amitriptyline  25mg  nightly  -Restarting Gabapentin , start Gabapentin  300mg  nightly  -Return in 6 weeks for f/u  Orders:   amitriptyline  (ELAVIL ) 25 MG tablet; Take 1 tablet (25 mg total) by mouth at bedtime.   TSH + free T4

## 2024-06-23 NOTE — Assessment & Plan Note (Addendum)
 Hyperlipidemia controlled, managed with statin therapy. Last LDL 58 in 11/2023.  -Continue Atorvastatin  40mg  daily, refill provided  -Stopping Fenofribrate -Update lipid panel. He is fasting  Orders:   Comprehensive Metabolic Panel (CMET)   Magnesium    atorvastatin  (LIPITOR) 40 MG tablet; Take 1 tablet (40 mg total) by mouth every evening.   Lipid Profile

## 2024-06-23 NOTE — Progress Notes (Signed)
 "  Established Patient Office Visit  Subjective   Patient ID: Rodney Howe, male    DOB: [birth year]  Age: 74 y.o. MRN: [id number]  Chief Complaint  Patient presents with   Transitions Of Care   Medication Refill    HPI Mr. Rodney Howe is a 74 year old male seen in office today for transition of care and medication refills. He is a new patient to me. Last seen in office by previous PCP in 02/2024. He voices he has been out of several medications. He voices he is out of Trelegy. Advised that Trelegy is prescribed by his pulmonologist, and informed him to contact pulmonology for refills. He is agreeable. He does endorse intermittent shortness of breath typically with activity. Advised that COPD uncontrolled without use of maintenance inhaler as prescribed can contribute to shortness of breath. Advised that he follow up with pulmonology.   He voices hx of syncope but has not had an episode of syncope in over 6 months, stating last time was when he was in the hospital for the GI bleed (08/2023).  He does endorse feelings of dizziness when he coughs a lot, describing his head to feel numb. Will obtain carotid ultrasound to rule out carotid stenosis. No carotid bruits present on exam today.   He does endorse chronic insomnia, overall well controlled with Amitriptyline . He states he often wakes up around 3am. He sometimes has neuropathy describing pain in the feet, often a burning, tingling sensation. Neuropathy affecting sleep. He does endorse feelings of anxiety at times but describes this as concentration issues and changes in memory/ memory loss, often forgetting what he was doing.  He admits he returned to drinking. He reports he drinks a 1/2 gallon of liquor, Vodka, daily. He voices he can quit whenever he wants if he wants to, does not desire to drink at this time. He voices since he has been without medication for pain and management of neuropathy  he turned to drinking.  We discussed  that alcohol use can contribute to described symptoms. Encouraged that he decrease alcohol intake with goal to stop.       06/23/2024   10:42 AM 03/25/2024    3:46 PM 02/10/2024   11:21 AM  Depression screen PHQ 2/9  Decreased Interest 0 0 0  Down, Depressed, Hopeless 0 0 0  PHQ - 2 Score 0 0 0  Altered sleeping 0 0 0  Tired, decreased energy 0 0 0  Change in appetite 0 0 0  Feeling bad or failure about yourself  0 0 1  Trouble concentrating 0 0 0  Moving slowly or fidgety/restless 0 0 0  Suicidal thoughts 0 0 0  PHQ-9 Score 0 0  1      Data saved with a previous flowsheet row definition     Past Medical History:  Diagnosis Date   BPH (benign prostatic hyperplasia)    COPD (chronic obstructive pulmonary disease) (HCC)    COPD (chronic obstructive pulmonary disease) (HCC)    Gastric ulcer without hemorrhage or perforation 09/02/2023   GERD (gastroesophageal reflux disease)    GI bleeding 09/01/2023   HLD (hyperlipidemia)    Upper GI bleed 09/03/2023    Past Surgical History:  Procedure Laterality Date   ANKLE SURGERY     COLONOSCOPY WITH PROPOFOL  N/A 09/19/2020   Procedure: COLONOSCOPY WITH PROPOFOL ;  Surgeon: Therisa Bi, MD;  Location: [facility] ENDOSCOPY;  Service: Gastroenterology;  Laterality: N/A;   ESOPHAGOGASTRODUODENOSCOPY N/A 09/02/2023   Procedure:  EGD (ESOPHAGOGASTRODUODENOSCOPY);  Surgeon: Jinny Carmine, MD;  Location: [facility] ENDOSCOPY;  Service: Endoscopy;  Laterality: N/A;   ESOPHAGOGASTRODUODENOSCOPY N/A 02/20/2024   Procedure: EGD (ESOPHAGOGASTRODUODENOSCOPY);  Surgeon: Maryruth Ole DASEN, MD;  Location: [facility] ENDOSCOPY;  Service: Endoscopy;  Laterality: N/A;    Review of Systems  Respiratory:  Positive for cough and shortness of breath.   Neurological:  Positive for dizziness and tingling. Negative for loss of consciousness and headaches.  Psychiatric/Behavioral:  Positive for substance abuse. Negative for depression and suicidal ideas. The patient is  nervous/anxious. The patient does not have insomnia.       Objective:     BP 132/84   Pulse 85   Resp 16   Ht 5' 5 (1.651 m)   Wt 167 lb (75.8 kg)   SpO2 99%   BMI 27.79 kg/m  BP Readings from Last 3 Encounters:  06/23/24 132/84  02/20/24 115/74  02/10/24 130/76   Wt Readings from Last 3 Encounters:  06/23/24 167 lb (75.8 kg)  02/20/24 168 lb (76.2 kg)  02/10/24 163 lb (73.9 kg)   SpO2 Readings from Last 3 Encounters:  06/23/24 99%  02/20/24 91%  02/10/24 99%      Physical Exam Constitutional:      Appearance: Normal appearance.  Neck:     Vascular: No carotid bruit.  Cardiovascular:     Rate and Rhythm: Normal rate and regular rhythm.     Heart sounds: Normal heart sounds.  Pulmonary:     Effort: Pulmonary effort is normal. No respiratory distress.     Breath sounds: Normal breath sounds.  Musculoskeletal:     Right lower leg: No edema.     Left lower leg: No edema.  Skin:    General: Skin is warm and dry.  Neurological:     General: No focal deficit present.     Mental Status: He is alert.  Psychiatric:        Mood and Affect: Mood normal.        Behavior: Behavior normal.     Last CBC Lab Results  Component Value Date   WBC 7.3 01/23/2024   HGB 13.6 01/23/2024   HCT 40.9 01/23/2024   MCV 85.7 01/23/2024   MCH 28.5 01/23/2024   RDW 18.4 (H) 01/23/2024   PLT 354 01/23/2024   Last metabolic panel Lab Results  Component Value Date   GLUCOSE 79 11/10/2023   NA 137 11/10/2023   K 5.0 11/10/2023   CL 102 11/10/2023   CO2 27 11/10/2023   BUN 11 11/10/2023   CREATININE 1.11 11/10/2023   EGFR 71 11/10/2023   CALCIUM  9.5 11/10/2023   PHOS 2.3 (L) 11/17/2017   PROT 6.7 11/10/2023   ALBUMIN 3.4 (L) 10/13/2023   BILITOT 0.4 11/10/2023   ALKPHOS 61 10/13/2023   AST 20 11/10/2023   ALT 17 11/10/2023   ANIONGAP 5 10/13/2023   Last lipids Lab Results  Component Value Date   CHOL 130 11/10/2023   HDL 54 11/10/2023   LDLCALC 58 11/10/2023    TRIG 92 11/10/2023   CHOLHDL 2.4 11/10/2023    Last thyroid  functions Lab Results  Component Value Date   TSH 3.094 11/24/2017    Last vitamin B12 and Folate Lab Results  Component Value Date   VITAMINB12 308 10/07/2023   FOLATE 19.1 10/07/2023          Assessment & Plan:   Assessment & Plan Dizziness Dizziness exacerbated with coughing describing his head to feel numb.  Denies recent syncopal episode.  See HPI for details.   -Obtain labs as noted below -Carotid ultrasound ordered to rule out carotid stenosis as cause of dizziness -Return in 6 weeks for f/u Orders:   Vitamin B1   B12 and Folate Panel   CBC w/Diff/Platelet   Comprehensive Metabolic Panel (CMET)   Magnesium    US  Carotid Duplex Bilateral; Future   TSH + free T4  Neuropathy Neuropathy affecting both feet, previously taking Gabapentin . He voices it has been over one year since he has taken Gabapentin . He voices he was given Tylenol  for pain and voices he ran out of this refill and has not been taking Tylenol . He admits that he uses alcohol for pain management. Advised to decrease ETOH intake with goal of stopping.   -Will restart Gabapentin , starting Gabapentin  300mg  at bedtime. Advised that medication can cause drowsiness  -Return in 6 weeks for f/u -Labs obtained to rule out electrolyte disturbance, anemia or other vitamin deficiencies as cause of symptoms  Orders:   Vitamin B1   B12 and Folate Panel   CBC w/Diff/Platelet   Comprehensive Metabolic Panel (CMET)   Magnesium    Vitamin D  (25 hydroxy)   amitriptyline  (ELAVIL ) 25 MG tablet; Take 1 tablet (25 mg total) by mouth at bedtime.   gabapentin  (NEURONTIN ) 300 MG capsule; Take 1 capsule (300 mg total) by mouth at bedtime.   TSH + free T4  Hyperlipidemia, unspecified hyperlipidemia type Hyperlipidemia controlled, managed with statin therapy. Last LDL 58 in 11/2023.  -Continue Atorvastatin  40mg  daily, refill provided  -Stopping  Fenofribrate -Update lipid panel. He is fasting  Orders:   Comprehensive Metabolic Panel (CMET)   Magnesium    atorvastatin  (LIPITOR) 40 MG tablet; Take 1 tablet (40 mg total) by mouth every evening.   Lipid Profile  Gastroesophageal reflux disease, unspecified whether esophagitis present GERD managed with Pantoprazole  40mg  BID. Patient follows with GI. Hx of gastric ulcers and GI bleed resulting in anemia. Hgb last checked 01/2024 and hgb 13.6. Anemia resolved.   -Continue Pantoprazole  40mg  BID, refill provided -Avoid NSAID use with hx of GI bleed -Refrain from ETOH intake -Advised to maintain follow up with GI  Orders:   Comprehensive Metabolic Panel (CMET)   Magnesium    Vitamin D  (25 hydroxy)   pantoprazole  (PROTONIX ) 40 MG tablet; Take 1 tablet (40 mg total) by mouth 2 (two) times daily.  Severe major depressive disorder (HCC) Severe depression controlled, see updated PHQ-9 above.   -Continue Amitriptlyine 25mg  nightly, refill provided  Orders:   Vitamin D  (25 hydroxy)   amitriptyline  (ELAVIL ) 25 MG tablet; Take 1 tablet (25 mg total) by mouth at bedtime.   TSH + free T4  Insomnia, unspecified type Insomnia overall controlled, minor sleep disturbances. See HPI for details.  -Continue Amitriptyline  25mg  nightly  -Restarting Gabapentin , start Gabapentin  300mg  nightly  -Return in 6 weeks for f/u  Orders:   amitriptyline  (ELAVIL ) 25 MG tablet; Take 1 tablet (25 mg total) by mouth at bedtime.   TSH + free T4  Chronic obstructive pulmonary disease, unspecified COPD type (HCC) COPD diagnosis, followed by pulmonology. COPD managed with Trelegy, prescribed by pulmonology. Albuterol  use as needed.   -Advised to maintain pulmonology follow up     History of solitary pulmonary nodule Hx of pulmonary nodule involving the medial right lower lobe. Last CT on 02/19/24 showing that pulmonary nodule resolved.  Pulmonary nodule hx and COPD followed by pulmonology  -Advised to maintain  follow up with pulmonology  -Continue with  annual chest CT screenings as directed by pulmonology     B12 deficiency B12 deficiency controlled, last B12 level 308 in 10/2023.  He admits he has not taken his B12 in at least one month, thought to be longer. He voices he ran out of refills.   -Update labs -Continue vitamin B12 1,000mcg once daily, refill provided  Orders:   B12 and Folate Panel   cyanocobalamin (VITAMIN B12) 1000 MCG tablet; Take 1 tablet (1,000 mcg total) by mouth daily.   folic acid  (FOLVITE ) 1 MG tablet; Take 1 tablet (1 mg total) by mouth daily.  Benign prostatic hyperplasia with lower urinary tract symptoms, symptom details unspecified BPH followed by urology. Tamsulosin  and Finasteride  for management of BPH, prescribed by urology.  -Advised to maintain urology follow up     History of anemia Hx of anemia thought to be s/t GI bleed and complicated with hx of alcohol abuse/ disorder. He has been out of folic acid , B12 and thiamine  for at least the last one month, thought to be longer. He voices he ran out of refills.   -Continue Thiamine  100mg  daily, refill provided -Continue Folic acid  1mg  daily, refill provided -Labs updated -Maintain hematology/ oncology follow up, needs to reschedule visit . Hem/ onc managing/ following iron  deficiency, past IV iron  transfusions  Orders:   Vitamin B1   B12 and Folate Panel   CBC w/Diff/Platelet   folic acid  (FOLVITE ) 1 MG tablet; Take 1 tablet (1 mg total) by mouth daily.   thiamine  (VITAMIN B-1) 100 MG tablet; Take 1 tablet (100 mg total) by mouth daily.  Alcohol abuse He admits he returned to drinking. He reports he drinks a 1/2 gallon of liquor, Vodka, daily. He voices he can quit whenever he wants if he wants to, does not desire to quit drinking at this time.  Orders:   cyanocobalamin (VITAMIN B12) 1000 MCG tablet; Take 1 tablet (1,000 mcg total) by mouth daily.   folic acid  (FOLVITE ) 1 MG tablet; Take 1 tablet (1 mg  total) by mouth daily.   thiamine  (VITAMIN B-1) 100 MG tablet; Take 1 tablet (100 mg total) by mouth daily.      Return in about 6 weeks (around 08/04/2024).    LAYMON LOISE CORE, FNP "

## 2024-06-23 NOTE — Assessment & Plan Note (Addendum)
 Neuropathy affecting both feet, previously taking Gabapentin . He voices it has been over one year since he has taken Gabapentin . He voices he was given Tylenol  for pain and voices he ran out of this refill and has not been taking Tylenol . He admits that he uses alcohol for pain management. Advised to decrease ETOH intake with goal of stopping.   -Will restart Gabapentin , starting Gabapentin  300mg  at bedtime. Advised that medication can cause drowsiness  -Return in 6 weeks for f/u -Labs obtained to rule out electrolyte disturbance, anemia or other vitamin deficiencies as cause of symptoms  Orders:   Vitamin B1   B12 and Folate Panel   CBC w/Diff/Platelet   Comprehensive Metabolic Panel (CMET)   Magnesium    Vitamin D  (25 hydroxy)   amitriptyline  (ELAVIL ) 25 MG tablet; Take 1 tablet (25 mg total) by mouth at bedtime.   gabapentin  (NEURONTIN ) 300 MG capsule; Take 1 capsule (300 mg total) by mouth at bedtime.   TSH + free T4

## 2024-06-23 NOTE — Assessment & Plan Note (Addendum)
 GERD managed with Pantoprazole  40mg  BID. Patient follows with GI. Hx of gastric ulcers and GI bleed resulting in anemia. Hgb last checked 01/2024 and hgb 13.6. Anemia resolved.   -Continue Pantoprazole  40mg  BID, refill provided -Avoid NSAID use with hx of GI bleed -Refrain from ETOH intake -Advised to maintain follow up with GI  Orders:   Comprehensive Metabolic Panel (CMET)   Magnesium    Vitamin D  (25 hydroxy)   pantoprazole  (PROTONIX ) 40 MG tablet; Take 1 tablet (40 mg total) by mouth 2 (two) times daily.

## 2024-06-23 NOTE — Assessment & Plan Note (Addendum)
 COPD diagnosis, followed by pulmonology. COPD managed with Trelegy, prescribed by pulmonology. Albuterol  use as needed.   -Advised to maintain pulmonology follow up

## 2024-06-23 NOTE — Assessment & Plan Note (Addendum)
 BPH followed by urology. Tamsulosin  and Finasteride  for management of BPH, prescribed by urology.  -Advised to maintain urology follow up

## 2024-06-23 NOTE — Assessment & Plan Note (Addendum)
 He admits he returned to drinking. He reports he drinks a 1/2 gallon of liquor, Vodka, daily. He voices he can quit whenever he wants if he wants to, does not desire to quit drinking at this time.  Orders:   cyanocobalamin (VITAMIN B12) 1000 MCG tablet; Take 1 tablet (1,000 mcg total) by mouth daily.   folic acid  (FOLVITE ) 1 MG tablet; Take 1 tablet (1 mg total) by mouth daily.   thiamine  (VITAMIN B-1) 100 MG tablet; Take 1 tablet (100 mg total) by mouth daily.

## 2024-06-24 ENCOUNTER — Ambulatory Visit: Payer: Self-pay | Admitting: Family Medicine

## 2024-06-24 LAB — TSH+FREE T4: TSH W/REFLEX TO FT4: 2.14 m[IU]/L (ref 0.40–4.50)

## 2024-06-29 LAB — LIPID PANEL
Cholesterol: 194 mg/dL
HDL: 69 mg/dL
LDL Cholesterol (Calc): 104 mg/dL — ABNORMAL HIGH
Non-HDL Cholesterol (Calc): 125 mg/dL
Total CHOL/HDL Ratio: 2.8 (calc)
Triglycerides: 112 mg/dL

## 2024-06-29 LAB — COMPREHENSIVE METABOLIC PANEL WITH GFR
AG Ratio: 1.5 (calc) (ref 1.0–2.5)
ALT: 28 U/L (ref 9–46)
AST: 31 U/L (ref 10–35)
Albumin: 4.1 g/dL (ref 3.6–5.1)
Alkaline phosphatase (APISO): 54 U/L (ref 35–144)
BUN: 11 mg/dL (ref 7–25)
CO2: 31 mmol/L (ref 20–32)
Calcium: 9.6 mg/dL (ref 8.6–10.3)
Chloride: 99 mmol/L (ref 98–110)
Creat: 1.11 mg/dL (ref 0.70–1.28)
Globulin: 2.8 g/dL (ref 1.9–3.7)
Glucose, Bld: 89 mg/dL (ref 65–99)
Potassium: 4.7 mmol/L (ref 3.5–5.3)
Sodium: 135 mmol/L (ref 135–146)
Total Bilirubin: 0.6 mg/dL (ref 0.2–1.2)
Total Protein: 6.9 g/dL (ref 6.1–8.1)
eGFR: 71 mL/min/{1.73_m2}

## 2024-06-29 LAB — CBC WITH DIFFERENTIAL/PLATELET
Absolute Lymphocytes: 2345 {cells}/uL (ref 850–3900)
Absolute Monocytes: 819 {cells}/uL (ref 200–950)
Basophils Absolute: 42 {cells}/uL (ref 0–200)
Basophils Relative: 0.6 %
Eosinophils Absolute: 182 {cells}/uL (ref 15–500)
Eosinophils Relative: 2.6 %
HCT: 43.7 % (ref 39.4–51.1)
Hemoglobin: 14.3 g/dL (ref 13.2–17.1)
MCH: 30.7 pg (ref 27.0–33.0)
MCHC: 32.7 g/dL (ref 31.6–35.4)
MCV: 93.8 fL (ref 81.4–101.7)
MPV: 10.3 fL (ref 7.5–12.5)
Monocytes Relative: 11.7 %
Neutro Abs: 3612 {cells}/uL (ref 1500–7800)
Neutrophils Relative %: 51.6 %
Platelets: 375 10*3/uL (ref 140–400)
RBC: 4.66 Million/uL (ref 4.20–5.80)
RDW: 12.4 % (ref 11.0–15.0)
Total Lymphocyte: 33.5 %
WBC: 7 10*3/uL (ref 3.8–10.8)

## 2024-06-29 LAB — MAGNESIUM: Magnesium: 1.9 mg/dL (ref 1.5–2.5)

## 2024-06-29 LAB — VITAMIN D 25 HYDROXY (VIT D DEFICIENCY, FRACTURES): Vit D, 25-Hydroxy: 26 ng/mL — ABNORMAL LOW (ref 30–100)

## 2024-06-29 LAB — VITAMIN B1: Vitamin B1 (Thiamine): 14 nmol/L (ref 8–30)

## 2024-06-29 LAB — B12 AND FOLATE PANEL
Folate: 21.7 ng/mL
Vitamin B-12: 300 pg/mL (ref 200–1100)

## 2024-06-30 ENCOUNTER — Ambulatory Visit

## 2024-08-03 ENCOUNTER — Ambulatory Visit: Admitting: Urology

## 2024-12-23 ENCOUNTER — Ambulatory Visit: Admitting: Dermatology

## 2025-03-31 ENCOUNTER — Ambulatory Visit
